# Patient Record
Sex: Female | Born: 1990 | Hispanic: No | Marital: Married | Smoking: Never smoker
Health system: Southern US, Community
[De-identification: ages and names within clinical notes are randomized; demographics above are authoritative.]

## PROBLEM LIST (undated history)

## (undated) DIAGNOSIS — Z87440 Personal history of urinary (tract) infections: Secondary | ICD-10-CM

## (undated) DIAGNOSIS — R079 Chest pain, unspecified: Secondary | ICD-10-CM

## (undated) DIAGNOSIS — O903 Peripartum cardiomyopathy: Secondary | ICD-10-CM

## (undated) DIAGNOSIS — M545 Low back pain, unspecified: Secondary | ICD-10-CM

## (undated) DIAGNOSIS — I509 Heart failure, unspecified: Secondary | ICD-10-CM

## (undated) DIAGNOSIS — E7800 Pure hypercholesterolemia, unspecified: Secondary | ICD-10-CM

## (undated) DIAGNOSIS — R002 Palpitations: Secondary | ICD-10-CM

## (undated) DIAGNOSIS — Z8619 Personal history of other infectious and parasitic diseases: Secondary | ICD-10-CM

## (undated) DIAGNOSIS — E785 Hyperlipidemia, unspecified: Secondary | ICD-10-CM

## (undated) DIAGNOSIS — K219 Gastro-esophageal reflux disease without esophagitis: Secondary | ICD-10-CM

## (undated) DIAGNOSIS — J45909 Unspecified asthma, uncomplicated: Secondary | ICD-10-CM

## (undated) DIAGNOSIS — Z8669 Personal history of other diseases of the nervous system and sense organs: Secondary | ICD-10-CM

## (undated) DIAGNOSIS — M1990 Unspecified osteoarthritis, unspecified site: Secondary | ICD-10-CM

## (undated) DIAGNOSIS — G4730 Sleep apnea, unspecified: Secondary | ICD-10-CM

## (undated) DIAGNOSIS — E041 Nontoxic single thyroid nodule: Secondary | ICD-10-CM

## (undated) DIAGNOSIS — I499 Cardiac arrhythmia, unspecified: Secondary | ICD-10-CM

## (undated) DIAGNOSIS — Z86718 Personal history of other venous thrombosis and embolism: Secondary | ICD-10-CM

## (undated) HISTORY — DX: Unspecified asthma, uncomplicated: J45.909

## (undated) HISTORY — DX: Unspecified osteoarthritis, unspecified site: M19.90

## (undated) HISTORY — DX: Chest pain, unspecified: R07.9

## (undated) HISTORY — PX: ABLATION: SHX5711

## (undated) HISTORY — DX: Heart failure, unspecified: I50.9

## (undated) HISTORY — DX: Personal history of other infectious and parasitic diseases: Z86.19

## (undated) HISTORY — DX: Cardiac arrhythmia, unspecified: I49.9

## (undated) HISTORY — DX: Personal history of urinary (tract) infections: Z87.440

## (undated) HISTORY — DX: Pure hypercholesterolemia, unspecified: E78.00

## (undated) HISTORY — DX: Hyperlipidemia, unspecified: E78.5

## (undated) HISTORY — DX: Peripartum cardiomyopathy: O90.3

## (undated) HISTORY — DX: Nontoxic single thyroid nodule: E04.1

## (undated) HISTORY — DX: Palpitations: R00.2

## (undated) HISTORY — DX: Sleep apnea, unspecified: G47.30

## (undated) HISTORY — DX: Gastro-esophageal reflux disease without esophagitis: K21.9

## (undated) HISTORY — DX: Personal history of other venous thrombosis and embolism: Z86.718

## (undated) HISTORY — DX: Personal history of other diseases of the nervous system and sense organs: Z86.69

## (undated) HISTORY — DX: Low back pain, unspecified: M54.50

---
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
## 2013-05-03 ENCOUNTER — Encounter: Payer: Self-pay | Admitting: Internal Medicine

## 2013-06-07 ENCOUNTER — Ambulatory Visit: Payer: Self-pay | Admitting: Internal Medicine

## 2013-07-13 ENCOUNTER — Encounter: Payer: Self-pay | Admitting: Internal Medicine

## 2013-07-13 ENCOUNTER — Ambulatory Visit (INDEPENDENT_AMBULATORY_CARE_PROVIDER_SITE_OTHER): Payer: BC Managed Care – PPO | Admitting: Internal Medicine

## 2013-07-13 VITALS — BP 120/70 | HR 84 | Temp 98.8°F | Ht 68.25 in | Wt 213.5 lb

## 2013-07-13 DIAGNOSIS — E7800 Pure hypercholesterolemia, unspecified: Secondary | ICD-10-CM

## 2013-07-13 DIAGNOSIS — Z8669 Personal history of other diseases of the nervous system and sense organs: Secondary | ICD-10-CM

## 2013-07-13 DIAGNOSIS — N390 Urinary tract infection, site not specified: Secondary | ICD-10-CM

## 2013-07-13 DIAGNOSIS — R002 Palpitations: Secondary | ICD-10-CM

## 2013-07-15 ENCOUNTER — Encounter: Payer: Self-pay | Admitting: Internal Medicine

## 2013-07-15 DIAGNOSIS — R002 Palpitations: Secondary | ICD-10-CM | POA: Insufficient documentation

## 2013-07-15 DIAGNOSIS — E7800 Pure hypercholesterolemia, unspecified: Secondary | ICD-10-CM | POA: Insufficient documentation

## 2013-07-15 DIAGNOSIS — Z8669 Personal history of other diseases of the nervous system and sense organs: Secondary | ICD-10-CM | POA: Insufficient documentation

## 2013-07-15 DIAGNOSIS — N390 Urinary tract infection, site not specified: Secondary | ICD-10-CM | POA: Insufficient documentation

## 2013-07-15 NOTE — Progress Notes (Signed)
Subjective:    Patient ID: Michele [name], female    DOB: [birth year], 22 y.o.   MRN: [id number]  HPI 22 year old female with past history of hypercholesterolemia and heart palpitations who comes in today to follow up on these issues as well as to establish care.  She graduated in 5/14.  Going to [employer] now for some extra classes.  Has been followed at [facility].  Reports a history of migraines.  Started freshmen year and lasted for a two year period.  Took Maxalt intermittently.  Since this time frame, she has not had any problems with headaches or migraines.  She does report having been told she has a irregular heart rhythm.  First diagnosed in 10th grade.  Saw cardiology.  Had a holter monitor, ECHO and a stress test.  States everything checked out fine.  She may occasionally notice some fluttering, but overall feels good.  Has no cardiac symptoms with increased activity or exertion.  No increased sob.  Overall feels good.  Did take magnesium for a short period and this seemed to help.  Stopped taking because she could not remember to take it.  She does have intermittent uti's.  Is on augmentin now.  Symptoms have cleared.  On birth control pills.  Is sexually active.     Past Medical History  Diagnosis Date  . History of chicken pox   . Hyperlipidemia   . Hx: UTI (urinary tract infection)   . Hx of migraines   . Palpitations     Outpatient Encounter Prescriptions as of 07/13/2013  Medication Sig Dispense Refill  . amoxicillin-clavulanate (AUGMENTIN) 875-125 MG per tablet Take 1 tablet by mouth 2 (two) times daily.      Marland Kitchen MELATONIN PO Take by mouth as needed (for sleep).      . norethindrone (NORA-BE) 0.35 MG tablet Take 1 tablet by mouth daily.       No facility-administered encounter medications on file as of 07/13/2013.    Review of Systems Patient denies any headaches now.  No lightheadedness or dizziness.  No sinus or allergy symptoms.  No chest pain, tightness.  Occasional  fluttering.  May last 5 minutes when it occurs.  Intermittent.  No known triggers.  No associated symptoms.  Does drink an increased amount of caffeine.  No increased shortness of breath, cough or congestion.  No nausea or vomiting.  No abdominal pain or cramping.  No bowel change, such as diarrhea, constipation, BRBPR or melana.  No urine change now.  On Augmentin.  LMP 06/23/13.   Discussed weight loss.      Objective:   Physical Exam Filed Vitals:   07/13/13 1113  BP: 120/70  Pulse: 84  Temp: 98.8 F (5.71 C)   22 year old female in no acute distress.   HEENT:  Nares- clear.  Oropharynx - without lesions. NECK:  Supple.  Nontender.  No audible bruit.  HEART:  Appears to be regular. LUNGS:  No crackles or wheezing audible.  Respirations even and unlabored.  RADIAL PULSE:  Equal bilaterally.    BREASTS:  Performed by gyn.   ABDOMEN:  Soft, nontender.  Bowel sounds present and normal.  No audible abdominal bruit.  GU: performed by gyn.   EXTREMITIES:  No increased edema present.  DP pulses palpable and equal bilaterally.       Assessment & Plan:  DESIRE FOR WEIGHT LOSS.   Discussed weight loss and ways to adjust diet, etc.  Follow.  HEALTH MAINTENANCE.  Last physical - gyn performed.  Will schedule her for a physical here in 1/14.  Obtain records.    I spent 45 minutes with the patient and more than 50% of the time was spent in consultation regarding the above.

## 2013-07-15 NOTE — Assessment & Plan Note (Signed)
Not an issue for her now.  Follow.  

## 2013-07-15 NOTE — Assessment & Plan Note (Signed)
On augmentin now.  Currently asymptomatic.  Follow.

## 2013-07-15 NOTE — Assessment & Plan Note (Signed)
Low cholesterol diet and exercise.  Discussed weight loss.  Follow. Check lipid panel.

## 2013-07-15 NOTE — Assessment & Plan Note (Signed)
Premature beats noted on exam.  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  Has had extensive cardiac work up.  Obtain records.  No symptoms with increased activity or exertion.  Discussed avoiding caffeine and stimulants.  Discussed magoxide.  Will restart magnesium.  Helped previously when she took the magnesium.  Follow.

## 2013-07-19 ENCOUNTER — Other Ambulatory Visit (INDEPENDENT_AMBULATORY_CARE_PROVIDER_SITE_OTHER): Payer: BC Managed Care – PPO

## 2013-07-19 DIAGNOSIS — E7800 Pure hypercholesterolemia, unspecified: Secondary | ICD-10-CM

## 2013-07-19 DIAGNOSIS — R002 Palpitations: Secondary | ICD-10-CM

## 2013-07-19 LAB — CBC WITH DIFFERENTIAL/PLATELET
Basophils Absolute: 0 10*3/uL (ref 0.0–0.1)
Basophils Relative: 0.4 % (ref 0.0–3.0)
Eosinophils Absolute: 0.4 10*3/uL (ref 0.0–0.7)
MCHC: 33.6 g/dL (ref 30.0–36.0)
MCV: 96.3 fl (ref 78.0–100.0)
Monocytes Absolute: 0.6 10*3/uL (ref 0.1–1.0)
Neutrophils Relative %: 50.3 % (ref 43.0–77.0)
Platelets: 257 10*3/uL (ref 150.0–400.0)
RDW: 13.1 % (ref 11.5–14.6)

## 2013-07-19 LAB — COMPREHENSIVE METABOLIC PANEL
AST: 23 U/L (ref 0–37)
Albumin: 4.2 g/dL (ref 3.5–5.2)
Alkaline Phosphatase: 56 U/L (ref 39–117)
BUN: 8 mg/dL (ref 6–23)
Potassium: 3.9 mEq/L (ref 3.5–5.1)

## 2013-07-19 LAB — LIPID PANEL
Cholesterol: 173 mg/dL (ref 0–200)
HDL: 47.9 mg/dL (ref >39.00)
Triglycerides: 92 mg/dL (ref 0.0–149.0)

## 2013-07-20 ENCOUNTER — Encounter: Payer: Self-pay | Admitting: Internal Medicine

## 2013-07-26 ENCOUNTER — Encounter: Payer: Self-pay | Admitting: Internal Medicine

## 2013-09-01 ENCOUNTER — Ambulatory Visit (INDEPENDENT_AMBULATORY_CARE_PROVIDER_SITE_OTHER): Payer: BC Managed Care – PPO | Admitting: Adult Health

## 2013-09-01 ENCOUNTER — Encounter: Payer: Self-pay | Admitting: Adult Health

## 2013-09-01 VITALS — BP 100/70 | HR 58 | Temp 98.2°F | Resp 12 | Ht 68.25 in | Wt 214.5 lb

## 2013-09-01 DIAGNOSIS — J329 Chronic sinusitis, unspecified: Secondary | ICD-10-CM | POA: Insufficient documentation

## 2013-09-01 MED ORDER — AMOXICILLIN-POT CLAVULANATE 875-125 MG PO TABS: 1.0000 | ORAL_TABLET | Freq: Two times a day (BID) | ORAL | Status: DC

## 2013-09-01 MED ORDER — MOMETASONE FUROATE 50 MCG/ACT NA SUSP: 2.0000 | Freq: Every day | NASAL | Status: DC

## 2013-09-01 NOTE — Assessment & Plan Note (Signed)
Start Augmentin. Continue supportive therapy such as OTC cold and sinus medication for symptoms. Robitussin cough suppressant. Saline spray to irrigate sinuses. Provided with sample of Nasonex 2 sprays into each nostril daily. RTC if no improvement within 3-4 days.

## 2013-09-01 NOTE — Patient Instructions (Addendum)
  Start Augmentin twice a day for 10 days.  Continue with supportive therapy such as:   Robitussin cough suppressant    Saline spray to irrigate your sinuses   You can take over the counter cold and sinus medication if this helps your symptoms further   For severe nasal congestion you can try Afrin twice a day for 3 days only.   Call if you are not better within 3-4 days.

## 2013-09-01 NOTE — Progress Notes (Signed)
  Subjective:    Patient ID: Michele [name], female    DOB: 1991-08-05, 22 y.o.   MRN: [id number]  HPI  Patient is a pleasant 22 year old female who presents to clinic with complaints of sinus symptoms that began approximately one week ago. She reports they were your typical cold like symptoms of nasal congestion, sinus drainage, cough. She felt she was improving however symptoms returned and now she is having green drainage from her nose. She denies fever or chills. She has taken over-the-counter medications such as Robitussin for her cough and ibuprofen.  Current Outpatient Prescriptions on File Prior to Visit  Medication Sig Dispense Refill  . MELATONIN PO Take by mouth as needed (for sleep).      . norethindrone (NORA-BE) 0.35 MG tablet Take 1 tablet by mouth daily.       No current facility-administered medications on file prior to visit.    Review of Systems  HENT: Positive for congestion, rhinorrhea, postnasal drip and sinus pressure. Negative for sore throat.   Respiratory: Positive for cough. Negative for shortness of breath and wheezing.        Objective:   Physical Exam  Constitutional: She is oriented to person, place, and time. She appears well-developed and well-nourished. No distress.  HENT:  Head: Normocephalic and atraumatic.  Right Ear: External ear normal.  Left Ear: External ear normal.  Pharyngeal erythema with noted posterior drainage. No exudate.  Cardiovascular: Normal rate, regular rhythm and normal heart sounds.  Exam reveals no gallop and no friction rub.   No murmur heard. Pulmonary/Chest: Effort normal and breath sounds normal. No respiratory distress. She has no wheezes. She has no rales.  Neurological: She is alert and oriented to person, place, and time.  Psychiatric: She has a normal mood and affect. Her behavior is normal. Judgment and thought content normal.    BP 100/70  Pulse 58  Temp(Src) 98.2 F (36.8 C) (Oral)  Resp 12  Ht 5' 8.25"  (1.734 m)  Wt 214 lb 8 oz (97.297 kg)  BMI 32.36 kg/m2  SpO2 99%        Assessment & Plan:

## 2013-09-05 ENCOUNTER — Encounter: Payer: Self-pay | Admitting: Internal Medicine

## 2013-10-27 ENCOUNTER — Other Ambulatory Visit: Payer: Self-pay

## 2014-01-24 ENCOUNTER — Encounter: Payer: Self-pay | Admitting: Internal Medicine

## 2014-01-24 ENCOUNTER — Ambulatory Visit (INDEPENDENT_AMBULATORY_CARE_PROVIDER_SITE_OTHER): Payer: BC Managed Care – PPO | Admitting: Internal Medicine

## 2014-01-24 VITALS — BP 110/80 | HR 83 | Temp 98.2°F | Ht 68.5 in | Wt 226.5 lb

## 2014-01-24 DIAGNOSIS — I499 Cardiac arrhythmia, unspecified: Secondary | ICD-10-CM

## 2014-01-24 DIAGNOSIS — N390 Urinary tract infection, site not specified: Secondary | ICD-10-CM

## 2014-01-24 DIAGNOSIS — Z01419 Encounter for gynecological examination (general) (routine) without abnormal findings: Secondary | ICD-10-CM | POA: Insufficient documentation

## 2014-01-24 DIAGNOSIS — Z0000 Encounter for general adult medical examination without abnormal findings: Secondary | ICD-10-CM

## 2014-01-24 DIAGNOSIS — Z23 Encounter for immunization: Secondary | ICD-10-CM

## 2014-01-24 DIAGNOSIS — E7800 Pure hypercholesterolemia, unspecified: Secondary | ICD-10-CM

## 2014-01-24 DIAGNOSIS — R002 Palpitations: Secondary | ICD-10-CM

## 2014-01-24 DIAGNOSIS — Z8669 Personal history of other diseases of the nervous system and sense organs: Secondary | ICD-10-CM

## 2014-01-24 NOTE — Progress Notes (Signed)
Pre-visit discussion using our clinic review tool. No additional management support is needed unless otherwise documented below in the visit note.  

## 2014-01-24 NOTE — Assessment & Plan Note (Addendum)
Premature beats noted on exam.  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  Has had cardiac work up previously.   Still need records.  No symptoms with increased activity or exertion.  Discussed avoiding caffeine and stimulants.  Discussed magoxide.  Will restart magnesium.  Will have cardiology evaluate to confirm if any further w/up warranted (i.e., f/u ECHO, etc).

## 2014-01-26 ENCOUNTER — Encounter: Payer: Self-pay | Admitting: Internal Medicine

## 2014-01-28 ENCOUNTER — Other Ambulatory Visit (HOSPITAL_COMMUNITY)
Admission: RE | Admit: 2014-01-28 | Discharge: 2014-01-28 | Disposition: A | Discharge status: Home / Self Care | Payer: BC Managed Care – PPO | Source: Ambulatory Visit | Attending: Internal Medicine | Admitting: Internal Medicine

## 2014-01-29 ENCOUNTER — Encounter: Payer: Self-pay | Admitting: Internal Medicine

## 2014-01-29 NOTE — Assessment & Plan Note (Signed)
Not an issue for her now.  Follow.  

## 2014-01-29 NOTE — Assessment & Plan Note (Signed)
Resolved

## 2014-01-29 NOTE — Assessment & Plan Note (Signed)
Low cholesterol diet and exercise.  Discussed weight loss.  Follow lipid panel.

## 2014-01-29 NOTE — Progress Notes (Signed)
Subjective:    Patient ID: Michele [name], female    DOB: 1991-07-26, 23 y.o.   MRN: [id number]  HPI 23 year old female with past history of hypercholesterolemia and heart palpitations who comes in today to follow up on these issues as well as for a complete physical exam.    She graduated in 5/14.  Going to [employer] now for some extra classes.  Has been followed at [facility].  Reports a history of migraines.  Started freshmen year and lasted for a two year period.  Took Maxalt intermittently.  Since then, she has not had any problems with headaches or migraines.  She does report having been told she has a irregular heart rhythm.  First diagnosed in 10th grade.  Saw cardiology.  Had a holter monitor, ECHO and a stress test.  States everything checked out fine.  Has no cardiac symptoms with increased activity or exertion.  No increased sob.  Do not have records to review previous w/up.  On birth control pills.  Is sexually active.     Past Medical History  Diagnosis Date  . History of chicken pox   . Hyperlipidemia   . Hx: UTI (urinary tract infection)   . Hx of migraines   . Palpitations     Outpatient Encounter Prescriptions as of 01/24/2014  Medication Sig  . Magnesium 500 MG TABS Take 1,000 mg by mouth daily.  Marland Kitchen MELATONIN PO Take by mouth as needed (for sleep).  . norethindrone (NORA-BE) 0.35 MG tablet Take 1 tablet by mouth daily.  . [DISCONTINUED] amoxicillin-clavulanate (AUGMENTIN) 875-125 MG per tablet Take 1 tablet by mouth 2 (two) times daily.  . [DISCONTINUED] mometasone (NASONEX) 50 MCG/ACT nasal spray Place 2 sprays into the nose daily.    Review of Systems Patient denies any headaches now.  No lightheadedness or dizziness.  No sinus or allergy symptoms.  No chest pain, tightness.  Previously noticed occasional fluttering.  Reported lasted 5 minutes when occurred.  Intermittent.  No known triggers.  No associated symptoms.  No increased shortness of breath, cough or congestion.   No nausea or vomiting.  No abdominal pain or cramping.  No bowel change, such as diarrhea, constipation, BRBPR or melana.  No urine change now.  LMP 01/10/14.  Regular.  On OCPs.       Objective:   Physical Exam  Filed Vitals:   01/24/14 1041  BP: 110/80  Pulse: 83  Temp: 98.2 F (7.7 C)   23 year old female in no acute distress.   HEENT:  Nares- clear.  Oropharynx - without lesions. NECK:  Supple.  Nontender.  No audible bruit.  HEART:  Appears to be regular with frequent premature beats.   LUNGS:  No crackles or wheezing audible.  Respirations even and unlabored.  RADIAL PULSE:  Equal bilaterally.    BREASTS:  No nipple discharge or nipple retraction present.  Could not appreciate any distinct nodules or axillary adenopathy.  ABDOMEN:  Soft, nontender.  Bowel sounds present and normal.  No audible abdominal bruit.  GU:  Normal external genitalia.  Vaginal vault without lesions.  Cervix identified.  Pap performed. Could not appreciate any adnexal masses or tenderness.  EXTREMITIES:  No increased edema present.  DP pulses palpable and equal bilaterally.          Assessment & Plan:  HEALTH MAINTENANCE.  Physical today.    I spent 25 minutes with the patient and more than 50% of the time was spent  in consultation regarding the above.

## 2014-01-30 ENCOUNTER — Encounter: Payer: Self-pay | Admitting: Internal Medicine

## 2014-02-08 ENCOUNTER — Ambulatory Visit: Payer: BC Managed Care – PPO | Admitting: Cardiovascular Disease

## 2014-02-14 ENCOUNTER — Ambulatory Visit: Payer: BC Managed Care – PPO | Admitting: Cardiovascular Disease

## 2014-02-21 ENCOUNTER — Ambulatory Visit: Payer: BC Managed Care – PPO | Admitting: Cardiovascular Disease

## 2014-03-07 ENCOUNTER — Ambulatory Visit (INDEPENDENT_AMBULATORY_CARE_PROVIDER_SITE_OTHER): Payer: BC Managed Care – PPO | Admitting: Cardiovascular Disease

## 2014-03-07 ENCOUNTER — Encounter: Payer: Self-pay | Admitting: Cardiovascular Disease

## 2014-03-07 VITALS — BP 118/80 | HR 103 | Ht 68.0 in | Wt 226.0 lb

## 2014-03-07 DIAGNOSIS — I493 Ventricular premature depolarization: Secondary | ICD-10-CM

## 2014-03-07 DIAGNOSIS — R002 Palpitations: Secondary | ICD-10-CM

## 2014-03-07 DIAGNOSIS — I4949 Other premature depolarization: Secondary | ICD-10-CM

## 2014-03-07 NOTE — Patient Instructions (Signed)
You are doing well. No medication changes were made.  Go to the app store Do a search for "pulse meter" Download cardiograph, instant heart rate  Please call us if you have new issues that need to be addressed before your next appt.

## 2014-03-07 NOTE — Progress Notes (Signed)
   Patient ID: Michele [name], female    DOB: 11-Oct-1991, 23 y.o.   MRN: [id number]  HPI Comments: Ms. Michele [name] is a very pleasant 23 year old woman with history of PVCs, prior workup by cardiology in 2006 with Holter monitor at that time, echocardiogram and stress test.  On presentation today, she reports that she feels well. She has asymptomatic PVCs. 2 years ago, she did have symptoms possibly associated with stress. No recent symptoms in the past 2 years. He is active, denies any significant shortness of breath or leg edema. No near syncope or syncope. No palpitations or fluttering on a regular basis.  Prior workup includes a Holter monitor 02/03/2005 that showed frequent PVCs, 14% of her total QRS complexes Repeat Holter 09/25/2005 showing frequent PVCs, 60% of her total beats Repeat Holter 06/02/2006 showing PVCs 4% of her total beats, occasional bigeminy  Echocardiogram 03/05/2005 showing essentially normal study Treadmill stress test with VO2 monitoring showing normal exercise capacity, suppression of her PVCs with exercise  EKG shows normal sinus rhythm with rate 103 beats per minute with rare PVC, no significant ST or T wave changes noted Long rhythm strips performed with minimal PVCs   Outpatient Encounter Prescriptions as of 03/07/2014  Medication Sig  . Magnesium 500 MG TABS Take 1,000 mg by mouth daily.  Marland Kitchen MELATONIN PO Take by mouth as needed (for sleep).  . norethindrone (NORA-BE) 0.35 MG tablet Take 1 tablet by mouth daily.    Review of Systems  Constitutional: Negative.   HENT: Negative.   Eyes: Negative.   Respiratory: Negative.   Cardiovascular:       Rare palpitations  Gastrointestinal: Negative.   Endocrine: Negative.   Musculoskeletal: Negative.   Skin: Negative.   Allergic/Immunologic: Negative.   Neurological: Negative.   Hematological: Negative.   Psychiatric/Behavioral: Negative.   All other systems reviewed and are negative.    BP 118/80  Pulse 103   Ht 5\' 8"  (1.727 m)  Wt 226 lb (102.513 kg)  BMI 34.37 kg/m2  Physical Exam  Nursing note and vitals reviewed. Constitutional: She is oriented to person, place, and time. She appears well-developed and well-nourished.  HENT:  Head: Normocephalic.  Nose: Nose normal.  Mouth/Throat: Oropharynx is clear and moist.  Eyes: Conjunctivae are normal. Pupils are equal, round, and reactive to light.  Neck: Normal range of motion. Neck supple. No JVD present.  Cardiovascular: Normal rate, regular rhythm, S1 normal, S2 normal, normal heart sounds and intact distal pulses.  Exam reveals no gallop and no friction rub.   No murmur heard. Pulmonary/Chest: Effort normal and breath sounds normal. No respiratory distress. She has no wheezes. She has no rales. She exhibits no tenderness.  Abdominal: Soft. Bowel sounds are normal. She exhibits no distension. There is no tenderness.  Musculoskeletal: Normal range of motion. She exhibits no edema and no tenderness.  Lymphadenopathy:    She has no cervical adenopathy.  Neurological: She is alert and oriented to person, place, and time. Coordination normal.  Skin: Skin is warm and dry. No rash noted. No erythema.  Psychiatric: She has a normal mood and affect. Her behavior is normal. Judgment and thought content normal.    Assessment and Plan

## 2014-03-07 NOTE — Assessment & Plan Note (Signed)
Sensation of palpitations on a rare basis likely from PVCs. Overall is not symptomatic

## 2014-03-07 NOTE — Assessment & Plan Note (Addendum)
Previous records were reviewed. 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 She has had Holter monitor x3. Normal stress test in the past, normal echocardiogram previously. Currently asymptomatic with no shortness of breath or chest discomfort, no near syncope or syncope. This can be managed medically. We did discuss using propranolol only for symptomatic PVCs. Currently she has no symptoms and would prefer not to take medications at this time. Additional workup could be done if she is symptomatic

## 2014-04-01 ENCOUNTER — Encounter: Payer: Self-pay | Admitting: Internal Medicine

## 2014-04-03 MED ORDER — NORETHINDRONE 0.35 MG PO TABS: 1.0000 | ORAL_TABLET | Freq: Every day | ORAL | Status: DC

## 2014-06-14 ENCOUNTER — Ambulatory Visit: Payer: Self-pay | Admitting: Internal Medicine

## 2014-07-25 ENCOUNTER — Ambulatory Visit: Payer: BC Managed Care – PPO | Admitting: Internal Medicine

## 2014-08-25 ENCOUNTER — Encounter: Payer: Self-pay | Admitting: Internal Medicine

## 2015-01-30 ENCOUNTER — Encounter: Payer: Self-pay | Admitting: Internal Medicine

## 2015-01-30 ENCOUNTER — Other Ambulatory Visit (HOSPITAL_COMMUNITY)
Admission: RE | Admit: 2015-01-30 | Discharge: 2015-01-30 | Disposition: A | Discharge status: Home / Self Care | Payer: BC Managed Care – PPO | Source: Ambulatory Visit | Attending: Internal Medicine | Admitting: Internal Medicine

## 2015-01-30 ENCOUNTER — Ambulatory Visit (INDEPENDENT_AMBULATORY_CARE_PROVIDER_SITE_OTHER): Payer: BC Managed Care – PPO | Admitting: Internal Medicine

## 2015-01-30 VITALS — BP 110/70 | HR 52 | Temp 98.2°F | Ht 67.75 in | Wt 229.2 lb

## 2015-01-30 DIAGNOSIS — Z113 Encounter for screening for infections with a predominantly sexual mode of transmission: Secondary | ICD-10-CM | POA: Diagnosis present

## 2015-01-30 DIAGNOSIS — Z01419 Encounter for gynecological examination (general) (routine) without abnormal findings: Secondary | ICD-10-CM | POA: Diagnosis not present

## 2015-01-30 DIAGNOSIS — N760 Acute vaginitis: Secondary | ICD-10-CM | POA: Insufficient documentation

## 2015-01-30 DIAGNOSIS — I493 Ventricular premature depolarization: Secondary | ICD-10-CM

## 2015-01-30 DIAGNOSIS — E7800 Pure hypercholesterolemia, unspecified: Secondary | ICD-10-CM

## 2015-01-30 DIAGNOSIS — Z139 Encounter for screening, unspecified: Secondary | ICD-10-CM

## 2015-01-30 DIAGNOSIS — Z0000 Encounter for general adult medical examination without abnormal findings: Secondary | ICD-10-CM

## 2015-01-30 DIAGNOSIS — Z13220 Encounter for screening for lipoid disorders: Secondary | ICD-10-CM

## 2015-01-30 LAB — LIPID PANEL
CHOL/HDL RATIO: 4
Cholesterol: 172 mg/dL (ref 0–200)
HDL: 47.5 mg/dL (ref >39.00)
LDL Cholesterol: 103 mg/dL — ABNORMAL HIGH (ref 0–99)
NonHDL: 124.5
TRIGLYCERIDES: 107 mg/dL (ref 0.0–149.0)
VLDL: 21.4 mg/dL (ref 0.0–40.0)

## 2015-01-30 LAB — CBC WITH DIFFERENTIAL/PLATELET
BASOS ABS: 0 10*3/uL (ref 0.0–0.1)
Basophils Relative: 0.2 % (ref 0.0–3.0)
EOS ABS: 0.2 10*3/uL (ref 0.0–0.7)
Eosinophils Relative: 1.7 % (ref 0.0–5.0)
HCT: 44.1 % (ref 36.0–46.0)
Hemoglobin: 15.2 g/dL — ABNORMAL HIGH (ref 12.0–15.0)
LYMPHS ABS: 2.9 10*3/uL (ref 0.7–4.0)
Lymphocytes Relative: 29.4 % (ref 12.0–46.0)
MCHC: 34.4 g/dL (ref 30.0–36.0)
MCV: 92.2 fl (ref 78.0–100.0)
MONO ABS: 0.6 10*3/uL (ref 0.1–1.0)
Monocytes Relative: 5.9 % (ref 3.0–12.0)
Neutro Abs: 6.3 10*3/uL (ref 1.4–7.7)
Neutrophils Relative %: 62.8 % (ref 43.0–77.0)
PLATELETS: 274 10*3/uL (ref 150.0–400.0)
RBC: 4.79 Mil/uL (ref 3.87–5.11)
RDW: 13.5 % (ref 11.5–15.5)
WBC: 10 10*3/uL (ref 4.0–10.5)

## 2015-01-30 LAB — COMPREHENSIVE METABOLIC PANEL
ALBUMIN: 4.4 g/dL (ref 3.5–5.2)
ALK PHOS: 54 U/L (ref 39–117)
ALT: 16 U/L (ref 0–35)
AST: 14 U/L (ref 0–37)
BUN: 9 mg/dL (ref 6–23)
CO2: 25 meq/L (ref 19–32)
CREATININE: 0.84 mg/dL (ref 0.40–1.20)
Calcium: 9.5 mg/dL (ref 8.4–10.5)
Chloride: 105 mEq/L (ref 96–112)
GFR: 87.79 mL/min (ref >60.00)
Glucose, Bld: 99 mg/dL (ref 70–99)
Potassium: 4 mEq/L (ref 3.5–5.1)
Sodium: 139 mEq/L (ref 135–145)
Total Bilirubin: 0.6 mg/dL (ref 0.2–1.2)
Total Protein: 7.3 g/dL (ref 6.0–8.3)

## 2015-01-30 LAB — TSH: TSH: 2.04 u[IU]/mL (ref 0.35–4.50)

## 2015-01-30 MED ORDER — NORETHINDRONE 0.35 MG PO TABS: 1.0000 | ORAL_TABLET | Freq: Every day | ORAL | Status: DC

## 2015-01-30 NOTE — Progress Notes (Signed)
Pre visit review using our clinic review tool, if applicable. No additional management support is needed unless otherwise documented below in the visit note. 

## 2015-01-31 ENCOUNTER — Encounter: Payer: Self-pay | Admitting: Internal Medicine

## 2015-01-31 DIAGNOSIS — Z0000 Encounter for general adult medical examination without abnormal findings: Secondary | ICD-10-CM | POA: Insufficient documentation

## 2015-01-31 LAB — HSV 2 ANTIBODY, IGG: HSV 2 Glycoprotein G Ab, IgG: 0.68 IV

## 2015-01-31 LAB — HIV ANTIBODY (ROUTINE TESTING W REFLEX): HIV 1&2 Ab, 4th Generation: NONREACTIVE

## 2015-01-31 NOTE — Assessment & Plan Note (Signed)
Physical today.  Pap today.   

## 2015-01-31 NOTE — Progress Notes (Signed)
Patient ID: Michele [name], female   DOB: 1991/11/19, 24 y.o.   MRN: [id number]   Subjective:    Patient ID: Michele [name], female    DOB: [birth year], 24 y.o.   MRN: [id number]  HPI  Patient here for her physical exam.  States she is doing well.  Has tried adjusting her diet.  LMP 01/13/15.  States takes her OCPs daily.  Has not skipped doses.  Is sexually active.  Uses protection.  Denies possibility of being pregnant.  No vaginal problems.  Does desire to be checked for STDs.  Overall feels good.  Working.     Past Medical History  Diagnosis Date  . History of chicken pox   . Hyperlipidemia   . Hx: UTI (urinary tract infection)   . Hx of migraines   . Palpitations     Current Outpatient Prescriptions on File Prior to Visit  Medication Sig Dispense Refill  . Magnesium 500 MG TABS Take 1,000 mg by mouth daily.    Marland Kitchen MELATONIN PO Take by mouth as needed (for sleep).     No current facility-administered medications on file prior to visit.    Review of Systems  Constitutional: Negative for appetite change (has adjusted her diet.  ) and unexpected weight change.  HENT: Negative for congestion and sinus pressure.   Respiratory: Negative for cough, chest tightness and shortness of breath.   Cardiovascular: Negative for chest pain, palpitations and leg swelling.  Gastrointestinal: Negative for nausea, vomiting, abdominal pain and diarrhea.  Genitourinary: Negative for dysuria, frequency and vaginal discharge.       Periods regular.    Musculoskeletal: Negative for back pain and joint swelling.  Skin: Negative for color change and rash.  Neurological: Negative for dizziness, light-headedness and headaches.       Objective:    Physical Exam  Constitutional: She is oriented to person, place, and time. She appears well-developed and well-nourished.  HENT:  Nose: Nose normal.  Mouth/Throat: Oropharynx is clear and moist.  Eyes: Right eye exhibits no discharge. Left eye exhibits no  discharge. No scleral icterus.  Neck: Neck supple. No thyromegaly present.  Cardiovascular: Normal rate and regular rhythm.   Pulmonary/Chest: Breath sounds normal. No accessory muscle usage. No tachypnea. No respiratory distress. She has no decreased breath sounds. She has no wheezes. She has no rhonchi. Right breast exhibits no inverted nipple, no mass, no nipple discharge and no tenderness (no axillary adenopathy). Left breast exhibits no inverted nipple, no mass, no nipple discharge and no tenderness (no axilarry adenopathy).  Abdominal: Soft. Bowel sounds are normal. There is no tenderness.  Genitourinary:  Normal external genitalia.  Vaginal vault without lesions.  Cervix identified.  Pap smear performed.  Could not appreciate any adnexal masses or tenderness.    Musculoskeletal: She exhibits no edema or tenderness.  Lymphadenopathy:    She has no cervical adenopathy.  Neurological: She is alert and oriented to person, place, and time.  Skin: Skin is warm. No rash noted.  Psychiatric: She has a normal mood and affect. Her behavior is normal.    BP 110/70 mmHg  Pulse 52  Temp(Src) 98.2 F (36.8 C) (Oral)  Ht 5' 7.75" (1.721 m)  Wt 229 lb 4 oz (103.987 kg)  BMI 35.11 kg/m2  SpO2 99%  LMP 01/13/2015 (Exact Date) Wt Readings from Last 3 Encounters:  01/30/15 229 lb 4 oz (103.987 kg)  03/07/14 226 lb (102.513 kg)  01/24/14 226 lb 8 oz (102.74 kg)  Lab Results  Component Value Date   WBC 10.0 01/30/2015   HGB 15.2* 01/30/2015   HCT 44.1 01/30/2015   PLT 274.0 01/30/2015   GLUCOSE 99 01/30/2015   CHOL 172 01/30/2015   TRIG 107.0 01/30/2015   HDL 47.50 01/30/2015   LDLCALC 103* 01/30/2015   ALT 16 01/30/2015   AST 14 01/30/2015   NA 139 01/30/2015   K 4.0 01/30/2015   CL 105 01/30/2015   CREATININE 0.84 01/30/2015   BUN 9 01/30/2015   CO2 25 01/30/2015   TSH 2.04 01/30/2015       Assessment & Plan:   Problem List Items Addressed This Visit    Frequent PVCs -  Primary    Saw cardiology.  Given previous w/up unrevealing, felt no further w/up warranted.  Currently asymptomatic.  Follow.        Relevant Orders   CBC with Differential/Platelet (Completed)   Comprehensive metabolic panel (Completed)   TSH (Completed)   Cervicovaginal ancillary only   Cytology - PAP   Health care maintenance    Physical today.  Pap today.       Hypercholesterolemia    Low cholesterol diet and exercise.        Relevant Orders   Cervicovaginal ancillary only   Cytology - PAP    Other Visit Diagnoses    Screening cholesterol level        Relevant Orders    Lipid panel (Completed)    Cervicovaginal ancillary only    Cytology - PAP    Screening        Relevant Orders    HIV antibody (with reflex) (Completed)    HSV 2 antibody, IgG (Completed)    Cervicovaginal ancillary only    Cytology - PAP      Desire for STD check.  Pelvic exam as outlined.  Check GC, chlamydia, wet prep, HSV and HIV.      Dale Dassel, MD

## 2015-01-31 NOTE — Assessment & Plan Note (Signed)
Low cholesterol diet and exercise.   

## 2015-01-31 NOTE — Assessment & Plan Note (Signed)
Saw cardiology.  Given previous w/up unrevealing, felt no further w/up warranted.  Currently asymptomatic.  Follow.

## 2015-02-01 ENCOUNTER — Encounter: Payer: Self-pay | Admitting: Internal Medicine

## 2015-02-01 LAB — CERVICOVAGINAL ANCILLARY ONLY
CHLAMYDIA, DNA PROBE: NEGATIVE
Neisseria Gonorrhea: NEGATIVE
Trichomonas: NEGATIVE

## 2015-02-01 LAB — CYTOLOGY - PAP

## 2015-02-02 LAB — CERVICOVAGINAL ANCILLARY ONLY
BACTERIAL VAGINITIS: NEGATIVE
CANDIDA VAGINITIS: NEGATIVE

## 2015-02-04 ENCOUNTER — Encounter: Payer: Self-pay | Admitting: Internal Medicine

## 2015-06-22 ENCOUNTER — Telehealth: Payer: Self-pay

## 2015-06-22 DIAGNOSIS — Z0000 Encounter for general adult medical examination without abnormal findings: Secondary | ICD-10-CM

## 2015-06-22 NOTE — Telephone Encounter (Signed)
Please schedule lab, thanks!

## 2015-06-22 NOTE — Telephone Encounter (Signed)
Order placed for varicella ab.

## 2015-06-22 NOTE — Telephone Encounter (Signed)
The pt called and is hoping to get a lab test ordered to prove she is immune to the chicken pox virus.   Can this be put in?   Thanks!

## 2015-06-22 NOTE — Telephone Encounter (Signed)
lvmom to schedule pt's lab apt

## 2015-07-04 ENCOUNTER — Telehealth: Payer: Self-pay

## 2015-07-04 NOTE — Telephone Encounter (Signed)
The pt's mother called and is hoping to bring her daughter's hand written immunization records in and have them entered into the computer. She states they have to be uploaded for her to start a program in August, but they must be entered into her medical record.  Is this something that is done here?  Mother callback - [phone]

## 2015-07-04 NOTE — Telephone Encounter (Signed)
Spoke with mother & notified her that we should be able to do that for her.

## 2015-07-06 ENCOUNTER — Other Ambulatory Visit (INDEPENDENT_AMBULATORY_CARE_PROVIDER_SITE_OTHER): Payer: BC Managed Care – PPO

## 2015-07-06 ENCOUNTER — Telehealth: Payer: Self-pay | Admitting: Internal Medicine

## 2015-07-06 DIAGNOSIS — Z0000 Encounter for general adult medical examination without abnormal findings: Secondary | ICD-10-CM

## 2015-07-06 NOTE — Telephone Encounter (Signed)
Form placed in red folder & immunization abstracted

## 2015-07-06 NOTE — Telephone Encounter (Signed)
Pt dropped off physical exam form to be filled out. Pt also dropped off a copy of immunization records. Information in Dr. Roby Lofts box/msn

## 2015-07-07 NOTE — Telephone Encounter (Signed)
Will complete when return to work.

## 2015-07-09 LAB — VARICELLA ZOSTER ANTIBODY, IGG: Varicella IgG: >4000 Index — ABNORMAL HIGH (ref <135.00)

## 2015-07-17 ENCOUNTER — Encounter: Payer: Self-pay | Admitting: Internal Medicine

## 2015-07-30 ENCOUNTER — Ambulatory Visit (INDEPENDENT_AMBULATORY_CARE_PROVIDER_SITE_OTHER): Payer: BC Managed Care – PPO | Admitting: Internal Medicine

## 2015-07-30 ENCOUNTER — Encounter: Payer: Self-pay | Admitting: Internal Medicine

## 2015-07-30 VITALS — BP 100/58 | HR 48 | Temp 98.2°F | Wt 227.2 lb

## 2015-07-30 DIAGNOSIS — E7800 Pure hypercholesterolemia, unspecified: Secondary | ICD-10-CM

## 2015-07-30 DIAGNOSIS — Z0289 Encounter for other administrative examinations: Secondary | ICD-10-CM | POA: Diagnosis not present

## 2015-07-30 DIAGNOSIS — Z0000 Encounter for general adult medical examination without abnormal findings: Secondary | ICD-10-CM | POA: Diagnosis not present

## 2015-07-30 DIAGNOSIS — I493 Ventricular premature depolarization: Secondary | ICD-10-CM

## 2015-07-30 NOTE — Progress Notes (Signed)
Patient ID: Michele [name], female   DOB: 21-Nov-1991, 24 y.o.   MRN: [id number]   Subjective:    Patient ID: Michele [name], female    DOB: 1991/05/12, 24 y.o.   MRN: [id number]  HPI  Patient here for a scheduled follow up.  Needs a school form completed.  She is planning to start school next week.  Doing well.  Stays active.  Has been exercising.  Reports no cardiac symptoms with increased activity or exertion.  No sob.  No increased heart rate or palpitations.  Eating and drinking well.  Bowels stable.  Periods regular.  No problems with lifting.     Past Medical History  Diagnosis Date  . History of chicken pox   . Hyperlipidemia   . Hx: UTI (urinary tract infection)   . Hx of migraines   . Palpitations     Outpatient Encounter Prescriptions as of 07/30/2015  Medication Sig  . Magnesium 500 MG TABS Take 1,000 mg by mouth daily.  Marland Kitchen MELATONIN PO Take by mouth as needed (for sleep).  . norethindrone (NORA-BE) 0.35 MG tablet Take 1 tablet (0.35 mg total) by mouth daily.   No facility-administered encounter medications on file as of 07/30/2015.    Review of Systems  Constitutional: Negative for appetite change and unexpected weight change.  HENT: Negative for congestion and sinus pressure.   Respiratory: Negative for cough, chest tightness and shortness of breath.   Cardiovascular: Negative for chest pain, palpitations and leg swelling.  Gastrointestinal: Negative for nausea, vomiting, abdominal pain and diarrhea.  Skin: Negative for color change and rash.  Neurological: Negative for dizziness, light-headedness and headaches.  Psychiatric/Behavioral: Negative for dysphoric mood and agitation.       Objective:     Blood pressure recheck:  118/76, pulse 68  Physical Exam  Constitutional: She appears well-developed and well-nourished. No distress.  HENT:  Nose: Nose normal.  Mouth/Throat: Oropharynx is clear and moist.  Neck: Neck supple. No thyromegaly present.    Cardiovascular: Normal rate and regular rhythm.   Pulmonary/Chest: Breath sounds normal. No respiratory distress. She has no wheezes.  Abdominal: Soft. Bowel sounds are normal. There is no tenderness.  Musculoskeletal: She exhibits no edema or tenderness.  Motor strength appears equal and normal bilateral upper and lower extremities.    Lymphadenopathy:    She has no cervical adenopathy.  Skin: No rash noted. No erythema.  Psychiatric: She has a normal mood and affect.    BP 100/58 mmHg  Pulse 48  Temp(Src) 98.2 F (36.8 C) (Oral)  Wt 227 lb 3.2 oz (103.057 kg)  SpO2 98%  LMP 07/24/2015 Wt Readings from Last 3 Encounters:  07/30/15 227 lb 3.2 oz (103.057 kg)  01/30/15 229 lb 4 oz (103.987 kg)  03/07/14 226 lb (102.513 kg)     Lab Results  Component Value Date   WBC 10.0 01/30/2015   HGB 15.2* 01/30/2015   HCT 44.1 01/30/2015   PLT 274.0 01/30/2015   GLUCOSE 99 01/30/2015   CHOL 172 01/30/2015   TRIG 107.0 01/30/2015   HDL 47.50 01/30/2015   LDLCALC 103* 01/30/2015   ALT 16 01/30/2015   AST 14 01/30/2015   NA 139 01/30/2015   K 4.0 01/30/2015   CL 105 01/30/2015   CREATININE 0.84 01/30/2015   BUN 9 01/30/2015   CO2 25 01/30/2015   TSH 2.04 01/30/2015       Assessment & Plan:   Problem List Items Addressed This Visit  Encounter for completion of form with patient    Form for school completed.        Frequent PVCs - Primary    Has seen cardiology.  No further w/up felt warranted.  Currently asymptomatic.  Is exercising.  Doing well.  Follow.        Health care maintenance    Physical 01/30/15.  PAP ok 01/30/15.        Hypercholesterolemia    Low cholesterol diet and exercise.  Follow cholesterol levels.            Dale [place], MD

## 2015-07-31 ENCOUNTER — Encounter: Payer: Self-pay | Admitting: Internal Medicine

## 2015-07-31 DIAGNOSIS — Z0289 Encounter for other administrative examinations: Secondary | ICD-10-CM | POA: Insufficient documentation

## 2015-07-31 NOTE — Assessment & Plan Note (Signed)
Low cholesterol diet and exercise.  Follow cholesterol levels.

## 2015-07-31 NOTE — Assessment & Plan Note (Signed)
Form for school completed.

## 2015-07-31 NOTE — Assessment & Plan Note (Signed)
Physical 01/30/15.  PAP ok 01/30/15.

## 2015-07-31 NOTE — Assessment & Plan Note (Signed)
Has seen cardiology.  No further w/up felt warranted.  Currently asymptomatic.  Is exercising.  Doing well.  Follow.

## 2015-08-13 ENCOUNTER — Encounter: Payer: Self-pay | Admitting: Internal Medicine

## 2015-08-13 NOTE — Telephone Encounter (Signed)
Please notify pt that I do not mind sending in a prescription (if needed) for drysol.  Which pharmacy does she want [place] to send the prescription?  I know that the [facility] will have this medication.  Please clarify with the patient which pharmacy and go ahead and call in prescription.  I am not sure a prescription is needed.  Let me know if I need to do anything.    Dr Lorin Picket

## 2015-08-14 ENCOUNTER — Telehealth: Payer: Self-pay

## 2015-08-14 MED ORDER — ALUMINUM CHLORIDE 20 % EX SOLN: Freq: Every day | CUTANEOUS | Status: DC

## 2015-08-14 NOTE — Telephone Encounter (Signed)
Noted.  Is there anything else I need to do with this.  If no, then ok.

## 2015-08-14 NOTE — Telephone Encounter (Signed)
Nope it is completed.

## 2015-08-14 NOTE — Telephone Encounter (Deleted)
Sent in per Dr. Lorin Picket, see mychart message for details.

## 2015-10-23 ENCOUNTER — Other Ambulatory Visit: Payer: Self-pay | Admitting: Internal Medicine

## 2015-10-23 NOTE — Telephone Encounter (Signed)
ok'd refill for ocp's one pack with 4 refills.

## 2015-10-23 NOTE — Telephone Encounter (Signed)
Please advise refill? 

## 2015-12-04 ENCOUNTER — Ambulatory Visit: Payer: BC Managed Care – PPO

## 2015-12-11 ENCOUNTER — Ambulatory Visit (INDEPENDENT_AMBULATORY_CARE_PROVIDER_SITE_OTHER): Payer: BC Managed Care – PPO | Admitting: *Deleted

## 2015-12-11 DIAGNOSIS — Z111 Encounter for screening for respiratory tuberculosis: Secondary | ICD-10-CM

## 2015-12-13 ENCOUNTER — Ambulatory Visit: Payer: BC Managed Care – PPO | Admitting: Surgical

## 2015-12-13 DIAGNOSIS — Z111 Encounter for screening for respiratory tuberculosis: Secondary | ICD-10-CM

## 2015-12-13 LAB — TB SKIN TEST
Induration: 0 mm
TB Skin Test: NEGATIVE

## 2015-12-13 NOTE — Progress Notes (Signed)
Patient came in for PPD read that was negative.

## 2016-01-01 ENCOUNTER — Ambulatory Visit: Payer: BC Managed Care – PPO

## 2016-01-03 ENCOUNTER — Ambulatory Visit: Payer: BC Managed Care – PPO

## 2016-01-08 ENCOUNTER — Ambulatory Visit (INDEPENDENT_AMBULATORY_CARE_PROVIDER_SITE_OTHER): Payer: BC Managed Care – PPO

## 2016-01-08 DIAGNOSIS — Z111 Encounter for screening for respiratory tuberculosis: Secondary | ICD-10-CM

## 2016-01-08 MED ORDER — TUBERCULIN PPD 5 UNIT/0.1ML ID SOLN
5.0000 [IU] | Freq: Once | INTRADERMAL | Status: AC
  Administered 2016-01-08: 5 [IU] via INTRADERMAL

## 2016-01-08 NOTE — Progress Notes (Signed)
Patient came for second PPD, placed on Left arm.  Will return on Thursday for reading.

## 2016-01-10 ENCOUNTER — Encounter: Payer: Self-pay | Admitting: *Deleted

## 2016-01-21 ENCOUNTER — Telehealth: Payer: Self-pay | Admitting: Internal Medicine

## 2016-01-21 ENCOUNTER — Other Ambulatory Visit: Payer: Self-pay

## 2016-01-21 MED ORDER — NORETHINDRONE 0.35 MG PO TABS: ORAL_TABLET | ORAL | Status: DC

## 2016-01-21 NOTE — Telephone Encounter (Signed)
filled

## 2016-01-21 NOTE — Telephone Encounter (Signed)
Pt called to reschedule her CPE due to having class. Pt needs a refill for her birth control and she rescheduled her appt for 04/04/2016 that's the next avail appt. ERRIN 0.35 MG tablet Pharmacy is [facility] - [place], [place] - [address] Call pt @ [phone]. Thank You!

## 2016-02-01 ENCOUNTER — Encounter: Payer: BC Managed Care – PPO | Admitting: Internal Medicine

## 2016-04-04 ENCOUNTER — Encounter: Payer: BC Managed Care – PPO | Admitting: Internal Medicine

## 2016-05-01 ENCOUNTER — Encounter: Payer: Self-pay | Admitting: Internal Medicine

## 2016-05-01 ENCOUNTER — Ambulatory Visit
Admission: RE | Admit: 2016-05-01 | Discharge: 2016-05-01 | Disposition: A | Discharge status: Home / Self Care | Payer: BLUE CROSS/BLUE SHIELD | Source: Ambulatory Visit | Attending: Internal Medicine | Admitting: Internal Medicine

## 2016-05-01 ENCOUNTER — Ambulatory Visit (INDEPENDENT_AMBULATORY_CARE_PROVIDER_SITE_OTHER): Payer: BLUE CROSS/BLUE SHIELD | Admitting: Internal Medicine

## 2016-05-01 VITALS — BP 110/80 | HR 71 | Temp 98.2°F | Resp 18 | Ht 67.25 in | Wt 217.4 lb

## 2016-05-01 DIAGNOSIS — E7800 Pure hypercholesterolemia, unspecified: Secondary | ICD-10-CM | POA: Diagnosis not present

## 2016-05-01 DIAGNOSIS — Z0000 Encounter for general adult medical examination without abnormal findings: Secondary | ICD-10-CM

## 2016-05-01 DIAGNOSIS — R300 Dysuria: Secondary | ICD-10-CM

## 2016-05-01 DIAGNOSIS — M545 Low back pain, unspecified: Secondary | ICD-10-CM

## 2016-05-01 DIAGNOSIS — R002 Palpitations: Secondary | ICD-10-CM | POA: Diagnosis not present

## 2016-05-01 DIAGNOSIS — M4806 Spinal stenosis, lumbar region: Secondary | ICD-10-CM | POA: Diagnosis not present

## 2016-05-01 LAB — URINALYSIS, ROUTINE W REFLEX MICROSCOPIC
BILIRUBIN URINE: NEGATIVE
Hgb urine dipstick: NEGATIVE
KETONES UR: NEGATIVE
LEUKOCYTES UA: NEGATIVE
NITRITE: NEGATIVE
PH: 7.5 (ref 5.0–8.0)
RBC / HPF: NONE SEEN (ref 0–?)
SPECIFIC GRAVITY, URINE: 1.01 (ref 1.000–1.030)
TOTAL PROTEIN, URINE-UPE24: NEGATIVE
URINE GLUCOSE: NEGATIVE
UROBILINOGEN UA: 0.2 (ref 0.0–1.0)

## 2016-05-01 LAB — POCT URINE PREGNANCY: Preg Test, Ur: NEGATIVE

## 2016-05-01 NOTE — Progress Notes (Signed)
Patient ID: Michele [name], female   DOB: September 26, 1991, 25 y.o.   MRN: [id number]   Subjective:    Patient ID: Michele [name], female    DOB: 28-Oct-1991, 25 y.o.   MRN: [id number]  HPI  Patient here for her physical exam.  She reports overall doing well.  Does report low back pain.  Years ago, landed hard and has intermittently had low back pain since.  Appears to be more frequent now.  No pain down her leg.  No numbness or tingling.  No urine or bowel change.  In school.  Handling stress well.  Is sexually active.  Uses protection.  Denies possibility of being pregnant.    Past Medical History  Diagnosis Date  . History of chicken pox   . Hyperlipidemia   . Hx: UTI (urinary tract infection)   . Hx of migraines   . Palpitations    Past Surgical History  Procedure Laterality Date  . Tonsillectomy and adenoidectomy  1997   Family History  Problem Relation Age of Onset  . Hyperlipidemia Father   . Hypertension Father   . Breast cancer Maternal Grandmother   . Prostate cancer Maternal Grandfather   . Heart disease Maternal Grandfather   . Hypertension Maternal Grandfather   . Diabetes Maternal Grandfather   . Breast cancer Paternal Grandmother   . Hyperlipidemia Paternal Grandmother   . Hypertension Paternal Grandmother   . Diabetes Paternal Grandmother   . Mental illness Paternal Grandmother   . Hyperlipidemia Paternal Grandfather   . Stroke Paternal Grandfather   . Hypertension Paternal Grandfather   . Hypertension Mother    Social History   Social History  . Marital Status: Single    Spouse Name: N/A  . Number of Children: 0  . Years of Education: N/A   Social History Main Topics  . Smoking status: Never Smoker   . Smokeless tobacco: Never Used  . Alcohol Use: 0.0 oz/week    0 Standard drinks or equivalent per week  . Drug Use: No  . Sexual Activity: Not Asked   Other Topics Concern  . None   Social History Narrative    Outpatient Encounter Prescriptions as  of 05/01/2016  Medication Sig  . aluminum chloride (DRYSOL) 20 % external solution Apply topically at bedtime.  . Magnesium 500 MG TABS Take 1,000 mg by mouth daily.  Marland Kitchen MELATONIN PO Take by mouth as needed (for sleep).  . norethindrone (ERRIN) 0.35 MG tablet TAKE (1) TABLET BY MOUTH EVERY DAY   No facility-administered encounter medications on file as of 05/01/2016.    Review of Systems  Constitutional: Negative for appetite change and unexpected weight change.  HENT: Negative for congestion and sinus pressure.   Eyes: Negative for pain and visual disturbance.  Respiratory: Negative for cough, chest tightness and shortness of breath.   Cardiovascular: Negative for chest pain, palpitations and leg swelling.  Gastrointestinal: Negative for nausea, vomiting, abdominal pain and diarrhea.  Genitourinary: Negative for dysuria and difficulty urinating.  Musculoskeletal: Positive for back pain. Negative for joint swelling.  Skin: Negative for color change and rash.  Neurological: Negative for dizziness, light-headedness and headaches.  Hematological: Negative for adenopathy. Does not bruise/bleed easily.  Psychiatric/Behavioral: Negative for dysphoric mood and agitation.       Objective:    Physical Exam  Constitutional: She is oriented to person, place, and time. She appears well-developed and well-nourished. No distress.  HENT:  Nose: Nose normal.  Mouth/Throat: Oropharynx is clear and  moist.  Eyes: Right eye exhibits no discharge. Left eye exhibits no discharge. No scleral icterus.  Neck: Neck supple. No thyromegaly present.  Cardiovascular: Normal rate and regular rhythm.   Pulmonary/Chest: Breath sounds normal. No accessory muscle usage. No tachypnea. No respiratory distress. She has no decreased breath sounds. She has no wheezes. She has no rhonchi. Right breast exhibits no inverted nipple, no mass, no nipple discharge and no tenderness (no axillary adenopathy). Left breast exhibits  no inverted nipple, no mass, no nipple discharge and no tenderness (no axilarry adenopathy).  Abdominal: Soft. Bowel sounds are normal. There is no tenderness.  Musculoskeletal: She exhibits no edema or tenderness.  Some discomfort with full extension of lower leg.    Lymphadenopathy:    She has no cervical adenopathy.  Neurological: She is alert and oriented to person, place, and time.  Skin: Skin is warm. No rash noted. No erythema.  Psychiatric: She has a normal mood and affect. Her behavior is normal.    BP 110/80 mmHg  Pulse 71  Temp(Src) 98.2 F (36.8 C) (Oral)  Resp 18  Ht 5' 7.25" (1.708 m)  Wt 217 lb 6 oz (98.601 kg)  BMI 33.80 kg/m2  SpO2 97%  LMP 04/10/2016 (Exact Date) Wt Readings from Last 3 Encounters:  05/01/16 217 lb 6 oz (98.601 kg)  07/30/15 227 lb 3.2 oz (103.057 kg)  01/30/15 229 lb 4 oz (103.987 kg)     Lab Results  Component Value Date   WBC 10.0 01/30/2015   HGB 15.2* 01/30/2015   HCT 44.1 01/30/2015   PLT 274.0 01/30/2015   GLUCOSE 99 01/30/2015   CHOL 172 01/30/2015   TRIG 107.0 01/30/2015   HDL 47.50 01/30/2015   LDLCALC 103* 01/30/2015   ALT 16 01/30/2015   AST 14 01/30/2015   NA 139 01/30/2015   K 4.0 01/30/2015   CL 105 01/30/2015   CREATININE 0.84 01/30/2015   BUN 9 01/30/2015   CO2 25 01/30/2015   TSH 2.04 01/30/2015       Assessment & Plan:   Problem List Items Addressed This Visit    Health care maintenance    Physical today 05/01/16.  PAP 01/30/15 - ok.       Hypercholesterolemia    Low cholesterol diet and exercise.  Follow lipid panel.       Low back pain    Low back pain.  No radicular symptoms.  Tylenol as directed.  Check xray.       Relevant Orders   POCT urine pregnancy (Completed)   DG Lumbar Spine 2-3 Views (Completed)   Palpitations    Worked up by cardiology.  See previous notes.  Currently she is doing well.  Follow.         Other Visit Diagnoses    Dysuria    -  Primary    Relevant Orders     Urinalysis, Routine w reflex microscopic (not at [facility]) (Completed)    T4, free        Dale Clawson, MD

## 2016-05-01 NOTE — Progress Notes (Signed)
Pre-visit discussion using our clinic review tool. No additional management support is needed unless otherwise documented below in the visit note.  

## 2016-05-02 ENCOUNTER — Encounter: Payer: Self-pay | Admitting: Internal Medicine

## 2016-05-04 ENCOUNTER — Encounter: Payer: Self-pay | Admitting: Internal Medicine

## 2016-05-04 DIAGNOSIS — M545 Low back pain, unspecified: Secondary | ICD-10-CM | POA: Insufficient documentation

## 2016-05-04 NOTE — Assessment & Plan Note (Signed)
Worked up by cardiology.  See previous notes.  Currently she is doing well.  Follow.

## 2016-05-04 NOTE — Assessment & Plan Note (Signed)
Physical today 05/01/16.  PAP 01/30/15 - ok.

## 2016-05-04 NOTE — Assessment & Plan Note (Signed)
Low cholesterol diet and exercise.  Follow lipid panel.   

## 2016-05-04 NOTE — Assessment & Plan Note (Signed)
Low back pain.  No radicular symptoms.  Tylenol as directed.  Check xray.

## 2016-07-17 ENCOUNTER — Telehealth: Payer: Self-pay | Admitting: Cardiovascular Disease

## 2016-07-17 NOTE — Telephone Encounter (Signed)
pt mother calling stating they say Dr Mariah Milling a few years ago, but was told PVS when she was younger but if anything came up, they would be okay to come back, states she's having some PVS and some SOB as well.   Pt c/o Shortness Of Breath: STAT if SOB developed within the last 24 hours or pt is noticeably SOB on the phone  1. Are you currently SOB (can you hear that pt is SOB on the phone)? Can't hear speaking with Mother but states she had it last yesterday and Tuesday a little.   2. How long have you been experiencing SOB? A few days   3. Are you SOB when sitting or when up moving around? Sitting, last happened when she was driving   4. Are you currently experiencing any other symptoms? Just some dizzy spells.    States right now she has this " light weird feeling' in her chest.

## 2016-07-17 NOTE — Telephone Encounter (Signed)
Spoke w/ pt's mother. She reports that pt was diagnosed w/ PVCs as a preteen and has been primarily asymptomatic.   Pt was driving a few days ago when she felt palpitations and became SOB.  Her HR on her monitor has varied b/t 54-73, has not checked manually. Pt has appt on Mon w/ Dr. Mariah Milling.  Advised her to have pt avoid caffeine and stress. She reports that pt is on break from school, so stress should be less and she should not need caffeine.  She does report that pt had complete workup as a preteen and was told that caffeine had no affect on her PVCs. Advised her to record her HR & BP over the weekend, keep appt on Monday, but to proceed to ED if sx become emergent.

## 2016-07-21 ENCOUNTER — Ambulatory Visit (INDEPENDENT_AMBULATORY_CARE_PROVIDER_SITE_OTHER): Payer: BLUE CROSS/BLUE SHIELD | Admitting: Cardiovascular Disease

## 2016-07-21 ENCOUNTER — Encounter: Payer: Self-pay | Admitting: Cardiovascular Disease

## 2016-07-21 VITALS — BP 120/82 | HR 93 | Ht 67.0 in | Wt 213.5 lb

## 2016-07-21 DIAGNOSIS — I493 Ventricular premature depolarization: Secondary | ICD-10-CM | POA: Diagnosis not present

## 2016-07-21 DIAGNOSIS — R0602 Shortness of breath: Secondary | ICD-10-CM | POA: Diagnosis not present

## 2016-07-21 NOTE — Patient Instructions (Addendum)
Medication Instructions:   Please start bystolic 1/2 up to a full pill as needed for palpitations/tachycardia  Labwork:  No new labs needed  Testing/Procedures:  We will order an echocardiogram for shortness of breath and PVCs Echocardiography is a painless test that uses sound waves to create images of your heart. It provides your doctor with information about the size and shape of your heart and how well your heart's chambers and valves are working. This procedure takes approximately one hour. There are no restrictions for this procedure.   Follow-Up: It was a pleasure seeing you in the office today. Please call us if you have new issues that need to be addressed before your next appt.  [phone]  Your physician wants you to follow-up in: as needed   If you need a refill on your cardiac medications before your next appointment, please call your pharmacy.    Echocardiogram An echocardiogram, or echocardiography, uses sound waves (ultrasound) to produce an image of your heart. The echocardiogram is simple, painless, obtained within a short period of time, and offers valuable information to your health care provider. The images from an echocardiogram can provide information such as:  Evidence of coronary artery disease (CAD).  Heart size.  Heart muscle function.  Heart valve function.  Aneurysm detection.  Evidence of a past heart attack.  Fluid buildup around the heart.  Heart muscle thickening.  Assess heart valve function. LET [facility] CARE PROVIDER KNOW ABOUT:  Any allergies you have.  All medicines you are taking, including vitamins, herbs, eye drops, creams, and over-the-counter medicines.  Previous problems you or members of your family have had with the use of anesthetics.  Any blood disorders you have.  Previous surgeries you have had.  Medical conditions you have.  Possibility of pregnancy, if this applies. BEFORE THE PROCEDURE  No special  preparation is needed. Eat and drink normally.  PROCEDURE   In order to produce an image of your heart, gel will be applied to your chest and a wand-like tool (transducer) will be moved over your chest. The gel will help transmit the sound waves from the transducer. The sound waves will harmlessly bounce off your heart to allow the heart images to be captured in real-time motion. These images will then be recorded.  You may need an IV to receive a medicine that improves the quality of the pictures. AFTER THE PROCEDURE You may return to your normal schedule including diet, activities, and medicines, unless your health care provider tells you otherwise.   This information is not intended to replace advice given to you by your health care provider. Make sure you discuss any questions you have with your health care provider.   Document Released: 12/05/2000 Document Revised: 12/29/2014 Document Reviewed: 08/15/2013 Elsevier Interactive Patient Education [employer]

## 2016-07-21 NOTE — Progress Notes (Signed)
Cardiology Office Note  Date:  07/21/2016   ID:  YASHIRA KUHRT, DOB 03/10/1991, MRN [id number]  PCP:  Dale Simpson, MD   Chief Complaint  Patient presents with  . Other    Patient last seen 2015 for PVC's. Meds reviewed by the patient verbally. Pt. c/o feeling PVC's, lightheadedness and shortness of breath.     HPI:  [name] is a very pleasant 25 year old woman with history of PVCs, prior workup by cardiology in 2006 with Holter monitor at that time, echocardiogram and stress test She presents for routine follow-up of her PVCs  In follow-up today, she has been relatively asymptomatic Rare episodes of chest fluttering, rare shortness of breath Otherwise has been relatively asymptomatic Currently in school, some stress  EKG on today's visit showing normal sinus rhythm with rate 92 bpm, PVCs in a bigeminal pattern, no other significant ST or T-wave changes  Prior workup includes a Holter monitor 02/03/2005 that showed frequent PVCs, 14% of her total QRS complexes Repeat Holter 09/25/2005 showing frequent PVCs, 60% of her total beats Repeat Holter 06/02/2006 showing PVCs 4% of her total beats, occasional bigeminy  Echocardiogram 03/05/2005 showing essentially normal study Treadmill stress test with VO2 monitoring showing normal exercise capacity, suppression of her PVCs with exercise  PMH:   has a past medical history of History of chicken pox; migraines; UTI (urinary tract infection); Hyperlipidemia; and Palpitations.  PSH:    Past Surgical History:  Procedure Laterality Date  . TONSILLECTOMY AND ADENOIDECTOMY  1997    Current Outpatient Prescriptions  Medication Sig Dispense Refill  . aluminum chloride (DRYSOL) 20 % external solution Apply topically at bedtime. 35 mL 0  . Magnesium 500 MG TABS Take 1,000 mg by mouth daily.    Marland Kitchen MELATONIN PO Take by mouth as needed (for sleep).    . norethindrone (ERRIN) 0.35 MG tablet TAKE (1) TABLET BY MOUTH EVERY DAY 1 Package 11    No current facility-administered medications for this visit.      Allergies:   Review of patient's allergies indicates no known allergies.   Social History:  The patient  reports that she has never smoked. She has never used smokeless tobacco. She reports that she drinks alcohol. She reports that she does not use drugs.   Family History:   family history includes Breast cancer in her maternal grandmother and paternal grandmother; Diabetes in her maternal grandfather and paternal grandmother; Heart disease in her maternal grandfather; Hyperlipidemia in her father, paternal grandfather, and paternal grandmother; Hypertension in her father, maternal grandfather, mother, paternal grandfather, and paternal grandmother; Mental illness in her paternal grandmother; Prostate cancer in her maternal grandfather; Stroke in her paternal grandfather.    Review of Systems: Review of Systems  Constitutional: Negative.   Respiratory: Negative.   Cardiovascular: Negative.   Gastrointestinal: Negative.   Musculoskeletal: Negative.   Neurological: Negative.   Psychiatric/Behavioral: Negative.   All other systems reviewed and are negative.    PHYSICAL EXAM: VS:  BP 120/82 (BP Location: Left Arm, Patient Position: Sitting, Cuff Size: Normal)   Pulse 93   Ht 5\' 7"  (1.702 m)   Wt 213 lb 8 oz (96.8 kg)   BMI 33.44 kg/m  , BMI Body mass index is 33.44 kg/m. GEN: Well nourished, well developed, in no acute distress  HEENT: normal  Neck: no JVD, carotid bruits, or masses Cardiac: RRR; ectopy noted, no murmurs, rubs, or gallops,no edema  Respiratory:  clear to auscultation bilaterally, normal work of breathing GI:  soft, nontender, nondistended, + BS MS: no deformity or atrophy  Skin: warm and dry, no rash Neuro:  Strength and sensation are intact Psych: euthymic mood, full affect    Recent Labs: No results found for requested labs within last 8760 hours.    Lipid Panel Lab Results   Component Value Date   CHOL 172 01/30/2015   HDL 47.50 01/30/2015   LDLCALC 103 (H) 01/30/2015   TRIG 107.0 01/30/2015      Wt Readings from Last 3 Encounters:  07/21/16 213 lb 8 oz (96.8 kg)  05/01/16 217 lb 6 oz (98.6 kg)  07/30/15 227 lb 3.2 oz (103.1 kg)       ASSESSMENT AND PLAN:  SOB (shortness of breath) - Plan: ECHOCARDIOGRAM COMPLETE Etiology of her rare episodes of shortness of breath is unclear, likely related to her ectopy Echocardiogram has been ordered to confirm normal cardiac function  PVC (premature ventricular contraction) - Plan: ECHOCARDIOGRAM COMPLETE High PVC burden dating back more than 10 years In general is asymptomatic Stress seems to bring on her symptoms Recommended echocardiogram to evaluate her cardiac function, also recommended she try low-dose beta blocker. She prefers beta blocker as needed rather than every day.  Suggested she try bystolic 5 up to 10 mg as needed   Total encounter time more than 15 minutes  Greater than 50% was spent in counseling and coordination of care with the patient   Disposition:   F/U  6 months   Orders Placed This Encounter  Procedures  . ECHOCARDIOGRAM COMPLETE     Signed, Dossie Arbour, M.D., Ph.D. 07/21/2016  [facility] [place], [place] [phone]

## 2016-07-22 ENCOUNTER — Encounter: Payer: Self-pay | Admitting: Cardiovascular Disease

## 2016-07-28 DIAGNOSIS — L814 Other melanin hyperpigmentation: Secondary | ICD-10-CM | POA: Diagnosis not present

## 2016-07-28 DIAGNOSIS — Z872 Personal history of diseases of the skin and subcutaneous tissue: Secondary | ICD-10-CM | POA: Diagnosis not present

## 2016-07-28 DIAGNOSIS — Z1283 Encounter for screening for malignant neoplasm of skin: Secondary | ICD-10-CM | POA: Diagnosis not present

## 2016-07-28 DIAGNOSIS — L218 Other seborrheic dermatitis: Secondary | ICD-10-CM | POA: Diagnosis not present

## 2016-08-04 ENCOUNTER — Encounter: Payer: Self-pay | Admitting: Family

## 2016-08-04 ENCOUNTER — Ambulatory Visit (INDEPENDENT_AMBULATORY_CARE_PROVIDER_SITE_OTHER): Payer: BLUE CROSS/BLUE SHIELD | Admitting: Family

## 2016-08-04 VITALS — BP 124/80 | HR 50 | Temp 98.2°F | Wt 214.0 lb

## 2016-08-04 DIAGNOSIS — R351 Nocturia: Secondary | ICD-10-CM | POA: Diagnosis not present

## 2016-08-04 DIAGNOSIS — R350 Frequency of micturition: Secondary | ICD-10-CM

## 2016-08-04 LAB — COMPREHENSIVE METABOLIC PANEL
ALBUMIN: 4.5 g/dL (ref 3.5–5.2)
ALK PHOS: 48 U/L (ref 39–117)
ALT: 11 U/L (ref 0–35)
AST: 11 U/L (ref 0–37)
BUN: 7 mg/dL (ref 6–23)
CO2: 26 mEq/L (ref 19–32)
CREATININE: 0.89 mg/dL (ref 0.40–1.20)
Calcium: 9.4 mg/dL (ref 8.4–10.5)
Chloride: 105 mEq/L (ref 96–112)
GFR: 81.16 mL/min (ref >60.00)
Glucose, Bld: 95 mg/dL (ref 70–99)
POTASSIUM: 4 meq/L (ref 3.5–5.1)
SODIUM: 139 meq/L (ref 135–145)
TOTAL PROTEIN: 7.3 g/dL (ref 6.0–8.3)
Total Bilirubin: 0.4 mg/dL (ref 0.2–1.2)

## 2016-08-04 LAB — CBC WITH DIFFERENTIAL/PLATELET
BASOS ABS: 0 10*3/uL (ref 0.0–0.1)
Basophils Relative: 0.4 % (ref 0.0–3.0)
EOS ABS: 0.1 10*3/uL (ref 0.0–0.7)
EOS PCT: 1.4 % (ref 0.0–5.0)
HCT: 43.5 % (ref 36.0–46.0)
HEMOGLOBIN: 14.9 g/dL (ref 12.0–15.0)
Lymphocytes Relative: 32.7 % (ref 12.0–46.0)
Lymphs Abs: 3.5 10*3/uL (ref 0.7–4.0)
MCHC: 34.3 g/dL (ref 30.0–36.0)
MCV: 95 fl (ref 78.0–100.0)
MONO ABS: 0.6 10*3/uL (ref 0.1–1.0)
Monocytes Relative: 6 % (ref 3.0–12.0)
Neutro Abs: 6.4 10*3/uL (ref 1.4–7.7)
Neutrophils Relative %: 59.5 % (ref 43.0–77.0)
Platelets: 292 10*3/uL (ref 150.0–400.0)
RBC: 4.57 Mil/uL (ref 3.87–5.11)
RDW: 13.1 % (ref 11.5–15.5)
WBC: 10.8 10*3/uL — AB (ref 4.0–10.5)

## 2016-08-04 LAB — POCT URINALYSIS DIPSTICK
Bilirubin, UA: NEGATIVE
Blood, UA: NEGATIVE
Glucose, UA: NEGATIVE
Ketones, UA: NEGATIVE
LEUKOCYTES UA: NEGATIVE
NITRITE UA: NEGATIVE
PROTEIN UA: NEGATIVE
SPEC GRAV UA: 1.01
UROBILINOGEN UA: 0.2
pH, UA: 6

## 2016-08-04 LAB — TSH: TSH: 2.19 u[IU]/mL (ref 0.35–4.50)

## 2016-08-04 LAB — POCT URINE PREGNANCY: Preg Test, Ur: NEGATIVE

## 2016-08-04 LAB — HEMOGLOBIN A1C: HEMOGLOBIN A1C: 5 % (ref 4.6–6.5)

## 2016-08-04 NOTE — Progress Notes (Signed)
Subjective:    Patient ID: Michele [name], female    DOB: Dec 07, 1991, 25 y.o.   MRN: [id number]  CC: Michele [name] is a 25 y.o. female who presents today for an acute visit.    HPI: Patient presents for urinary frequency, nausea for past week.  Endorses diarrhea. No blood in stool. Wakes up thirsty. Recent UTI and treated with keflex.  Sexual active. Missed 2 days of birth controls LMP: 06/10/16. No breast tenderness. One day of spotting one month ago. No concern for STDs. No change in vaginal discharge, vaginal pain.    HISTORY:  Past Medical History:  Diagnosis Date  . History of chicken pox   . Hx of migraines   . Hx: UTI (urinary tract infection)   . Hyperlipidemia   . Palpitations    Past Surgical History:  Procedure Laterality Date  . TONSILLECTOMY AND ADENOIDECTOMY  1997   Family History  Problem Relation Age of Onset  . Hyperlipidemia Father   . Hypertension Father   . Hypertension Mother   . Breast cancer Maternal Grandmother   . Prostate cancer Maternal Grandfather   . Heart disease Maternal Grandfather   . Hypertension Maternal Grandfather   . Diabetes Maternal Grandfather   . Breast cancer Paternal Grandmother   . Hyperlipidemia Paternal Grandmother   . Hypertension Paternal Grandmother   . Diabetes Paternal Grandmother   . Mental illness Paternal Grandmother   . Hyperlipidemia Paternal Grandfather   . Stroke Paternal Grandfather   . Hypertension Paternal Grandfather     Allergies: Review of patient's allergies indicates no known allergies. Current Outpatient Prescriptions on File Prior to Visit  Medication Sig Dispense Refill  . aluminum chloride (DRYSOL) 20 % external solution Apply topically at bedtime. 35 mL 0  . Magnesium 500 MG TABS Take 1,000 mg by mouth daily.    Marland Kitchen MELATONIN PO Take by mouth as needed (for sleep).    . norethindrone (ERRIN) 0.35 MG tablet TAKE (1) TABLET BY MOUTH EVERY DAY 1 Package 11   No current facility-administered  medications on file prior to visit.     Social History  Substance Use Topics  . Smoking status: Never Smoker  . Smokeless tobacco: Never Used  . Alcohol use 0.0 oz/week    Review of Systems  Constitutional: Negative for chills and fever.  Respiratory: Negative for cough.   Cardiovascular: Negative for chest pain and palpitations.  Gastrointestinal: Negative for nausea and vomiting.  Endocrine: Positive for polydipsia and polyuria. Negative for polyphagia.  Genitourinary: Positive for frequency. Negative for dysuria, hematuria, vaginal bleeding, vaginal discharge and vaginal pain.      Objective:    BP 124/80   Pulse (!) 50   Temp 98.2 F (36.8 C) (Oral)   Wt 214 lb (97.1 kg)   SpO2 100%   BMI 33.52 kg/m    Physical Exam  Constitutional: She appears well-developed and well-nourished.  Cardiovascular: Normal rate, regular rhythm, normal heart sounds and normal pulses.   Pulmonary/Chest: Effort normal and breath sounds normal. She has no wheezes. She has no rhonchi. She has no rales.  Abdominal: There is no CVA tenderness.  Neurological: She is alert.  Skin: Skin is warm and dry.  Psychiatric: She has a normal mood and affect. Her speech is normal and behavior is normal. Thought content normal.  Vitals reviewed.      Assessment & Plan:   1. Frequent urination  Urinalysis dipstick is negative for blood, glucose, leukocytes. This is  reassuring in the context of working diagnosis of possible diabetes. Urine dipstick is also negative for pregnancy. Pending further workup with labs.   - POCT Urinalysis Dipstick - Comprehensive metabolic panel - CBC with Differential/Platelet - Hemoglobin A1c - TSH - CULTURE, URINE COMPREHENSIVE - POCT urine pregnancy    I am having Ms. Obie DredgeLangley maintain her MELATONIN PO, Magnesium, aluminum chloride, norethindrone, ketoconazole, and nebivolol.   Meds ordered this encounter  Medications  . ketoconazole (NIZORAL) 2 % shampoo     Refill:  3  . nebivolol (BYSTOLIC) 5 MG tablet    Sig: Take 5 mg by mouth as needed.    Return precautions given.   Risks, benefits, and alternatives of the medications and treatment plan prescribed today were discussed, and patient expressed understanding.   Education regarding symptom management and diagnosis given to patient on AVS.  Continue to follow with Dale DurhamSCOTT, CHARLENE, MD for routine health maintenance.   Jannifer HickMegan E Langley and I agreed with plan.   Rennie PlowmanMargaret Arnett, FNP

## 2016-08-04 NOTE — Progress Notes (Signed)
Pre visit review using our clinic review tool, if applicable. No additional management support is needed unless otherwise documented below in the visit note. 

## 2016-08-04 NOTE — Patient Instructions (Signed)
Urine looks fine. Symptom is nonspecific at this time.   Will call with lab results.    If there is no improvement in your symptoms, or if there is any worsening of symptoms, or if you have any additional concerns, please return for re-evaluation; or, if we are closed, consider going to the Emergency Room for evaluation if symptoms urgent.

## 2016-08-06 ENCOUNTER — Ambulatory Visit (INDEPENDENT_AMBULATORY_CARE_PROVIDER_SITE_OTHER): Payer: BLUE CROSS/BLUE SHIELD

## 2016-08-06 ENCOUNTER — Other Ambulatory Visit: Payer: Self-pay

## 2016-08-06 DIAGNOSIS — R0602 Shortness of breath: Secondary | ICD-10-CM

## 2016-08-06 DIAGNOSIS — I493 Ventricular premature depolarization: Secondary | ICD-10-CM

## 2016-08-07 LAB — CULTURE, URINE COMPREHENSIVE

## 2016-08-13 ENCOUNTER — Telehealth: Payer: Self-pay | Admitting: Cardiovascular Disease

## 2016-08-13 NOTE — Telephone Encounter (Signed)
Pt would like echo results. STates she received them through my chart, but would like someone to explain them. Please call.

## 2016-08-13 NOTE — Telephone Encounter (Signed)
Reviewed preliminary report with patient and she wanted to know if there should be any concern regarding the calcifications or the trivial regurgitation that were noted on echo. Let her know that overall her echo showed great pumping action and nothing to worry about at this time. Informed her that once Dr. Mariah Milling reviews report he will send his nurse a report for her to call results to her explaining these things. She verbalized understanding and had no further questions at this time.

## 2016-08-15 NOTE — Telephone Encounter (Signed)
Reviewed results w/ pt.  Advised her of Dr. Windell Hummingbird recommendation.  She currently takes bystolic 5 mg prn for PVCs. She reports that they are only occasional and not bothersome at this point.  Asked her to call back if they increase in frequency. She states that her father is a physician, she had him review results and he reassured her, as well.   She is appreciative and will call back w/ any further questions or concerns.

## 2016-08-15 NOTE — Telephone Encounter (Signed)
Echocardiogram shows normal cardiac function, overall he good study PVC frequency is not hurting her heart function We did see frequent PVCs while she did her echo Would recommend beta blocker as we discussed on previous office visit If she has worsening symptoms from PVCs, antiarrhythmic medications could be used to suppress PVCs

## 2016-08-27 ENCOUNTER — Encounter: Payer: Self-pay | Admitting: Internal Medicine

## 2016-09-24 ENCOUNTER — Telehealth: Payer: BLUE CROSS/BLUE SHIELD | Admitting: Family

## 2016-09-24 ENCOUNTER — Encounter: Payer: Self-pay | Admitting: Internal Medicine

## 2016-09-24 DIAGNOSIS — N390 Urinary tract infection, site not specified: Secondary | ICD-10-CM

## 2016-09-24 MED ORDER — SULFAMETHOXAZOLE-TRIMETHOPRIM 800-160 MG PO TABS: 1.0000 | ORAL_TABLET | Freq: Two times a day (BID) | ORAL | 0 refills | Status: DC

## 2016-09-24 NOTE — Progress Notes (Signed)

## 2016-09-30 DIAGNOSIS — Z23 Encounter for immunization: Secondary | ICD-10-CM | POA: Diagnosis not present

## 2016-10-07 ENCOUNTER — Ambulatory Visit (INDEPENDENT_AMBULATORY_CARE_PROVIDER_SITE_OTHER): Payer: BLUE CROSS/BLUE SHIELD | Admitting: Family

## 2016-10-07 ENCOUNTER — Encounter: Payer: Self-pay | Admitting: Family

## 2016-10-07 VITALS — BP 116/78 | HR 92 | Temp 97.8°F | Wt 214.8 lb

## 2016-10-07 DIAGNOSIS — R300 Dysuria: Secondary | ICD-10-CM | POA: Diagnosis not present

## 2016-10-07 DIAGNOSIS — N3000 Acute cystitis without hematuria: Secondary | ICD-10-CM

## 2016-10-07 LAB — POCT URINALYSIS DIP (MANUAL ENTRY)
BILIRUBIN UA: NEGATIVE
BILIRUBIN UA: NEGATIVE
Glucose, UA: 100 — AB
Leukocytes, UA: NEGATIVE
NITRITE UA: POSITIVE — AB
PH UA: 6.5
RBC UA: NEGATIVE
Spec Grav, UA: 1.015
Urobilinogen, UA: 1

## 2016-10-07 MED ORDER — CIPROFLOXACIN HCL 250 MG PO TABS: 250.0000 mg | ORAL_TABLET | Freq: Two times a day (BID) | ORAL | 0 refills | Status: DC

## 2016-10-07 NOTE — Progress Notes (Signed)
Subjective:    Patient ID: Michele [name], female    DOB: 05/30/1991, 25 y.o.   MRN: [id number]  CC: Michele [name] is a 25 y.o. female who presents today for an acute visit.    HPI: Patient presents for acute visit with chief complaint of dysuria x one day. Notes she had 'noro' for 2 days with nausea, vomiting, and diarrhea since resolved. Took an azo yesterday.  Mild cramp on right side. She has had recurrent UTIs. No concern for STDs, pregnancy. No fever, chills. No changes in vaginal discharge. LMP 09/18/16.  Completed Bactrim from provider on call 10/4.    A1C 5 07/2016.     HISTORY:  Past Medical History:  Diagnosis Date  . History of chicken pox   . Hx of migraines   . Hx: UTI (urinary tract infection)   . Hyperlipidemia   . Palpitations    Past Surgical History:  Procedure Laterality Date  . TONSILLECTOMY AND ADENOIDECTOMY  1997   Family History  Problem Relation Age of Onset  . Hyperlipidemia Father   . Hypertension Father   . Hypertension Mother   . Breast cancer Maternal Grandmother   . Prostate cancer Maternal Grandfather   . Heart disease Maternal Grandfather   . Hypertension Maternal Grandfather   . Diabetes Maternal Grandfather   . Breast cancer Paternal Grandmother   . Hyperlipidemia Paternal Grandmother   . Hypertension Paternal Grandmother   . Diabetes Paternal Grandmother   . Mental illness Paternal Grandmother   . Hyperlipidemia Paternal Grandfather   . Stroke Paternal Grandfather   . Hypertension Paternal Grandfather     Allergies: Review of patient's allergies indicates no known allergies. Current Outpatient Prescriptions on File Prior to Visit  Medication Sig Dispense Refill  . aluminum chloride (DRYSOL) 20 % external solution Apply topically at bedtime. 35 mL 0  . ketoconazole (NIZORAL) 2 % shampoo   3  . Magnesium 500 MG TABS Take 1,000 mg by mouth daily.    Marland Kitchen. MELATONIN PO Take by mouth as needed (for sleep).    . nebivolol (BYSTOLIC) 5  MG tablet Take 5 mg by mouth as needed.    . norethindrone (ERRIN) 0.35 MG tablet TAKE (1) TABLET BY MOUTH EVERY DAY 1 Package 11  . sulfamethoxazole-trimethoprim (BACTRIM DS) 800-160 MG tablet Take 1 tablet by mouth 2 (two) times daily. 14 tablet 0   No current facility-administered medications on file prior to visit.     Social History  Substance Use Topics  . Smoking status: Never Smoker  . Smokeless tobacco: Never Used  . Alcohol use 0.0 oz/week    Review of Systems  Constitutional: Negative for chills and fever.  Respiratory: Negative for cough.   Cardiovascular: Negative for chest pain and palpitations.  Gastrointestinal: Negative for nausea and vomiting.  Genitourinary: Positive for dysuria.      Objective:    BP 116/78   Pulse 92   Temp 97.8 F (36.6 C) (Oral)   Wt 214 lb 12.8 oz (97.4 kg)   LMP 09/15/2016   SpO2 99%   BMI 33.64 kg/m    Physical Exam  Constitutional: She appears well-developed and well-nourished.  Cardiovascular: Normal rate, regular rhythm, normal heart sounds and normal pulses.   Pulmonary/Chest: Effort normal and breath sounds normal. She has no wheezes. She has no rhonchi. She has no rales.  Abdominal: There is no CVA tenderness.  Neurological: She is alert.  Skin: Skin is warm and dry.  Psychiatric:  She has a normal mood and affect. Her speech is normal and behavior is normal. Thought content normal.  Vitals reviewed.      Assessment & Plan:    Acute cystitis without hematuria Afebrile. UA positive for nitrites and protein. Patient I agreed to treat empirically. Pending culture.  - ciprofloxacin (CIPRO) 250 MG tablet; Take 1 tablet (250 mg total) by mouth 2 (two) times daily.  Dispense: 6 tablet; Refill: 0    I am having Ms. [name] start on ciprofloxacin. I am also having her maintain her MELATONIN PO, Magnesium, aluminum chloride, norethindrone, ketoconazole, nebivolol, and sulfamethoxazole-trimethoprim.   Meds ordered this  encounter  Medications  . ciprofloxacin (CIPRO) 250 MG tablet    Sig: Take 1 tablet (250 mg total) by mouth 2 (two) times daily.    Dispense:  6 tablet    Refill:  0    Order Specific Question:   Supervising Provider    Answer:   Sherlene Shams [[id number]]    Return precautions given.   Risks, benefits, and alternatives of the medications and treatment plan prescribed today were discussed, and patient expressed understanding.   Education regarding symptom management and diagnosis given to patient on AVS.  Continue to follow with Dale [place], MD for routine health maintenance.   Michele Hick and I agreed with plan.   Rennie Plowman, FNP

## 2016-10-07 NOTE — Progress Notes (Signed)
Left message for patient to return call back.  

## 2016-10-07 NOTE — Patient Instructions (Signed)
Drink plenty of water and take antibiotic as prescribed.   We are pending the urine culture to know the organism causing the infection and our office will call you with results. If the particular organism requires a different antibiotic than the on prescribed, we will place an order for a new prescription at that time.   If you symptoms worsen or you have new symptoms, please contact our office, or return to clinic for re evaluation.  Urinary Tract Infection Urinary tract infections (UTIs) can develop anywhere along your urinary tract. Your urinary tract is your body's drainage system for removing wastes and extra water. Your urinary tract includes two kidneys, two ureters, a bladder, and a urethra. Your kidneys are a pair of bean-shaped organs. Each kidney is about the size of your fist. They are located below your ribs, one on each side of your spine. CAUSES Infections are caused by microbes, which are microscopic organisms, including fungi, viruses, and bacteria. These organisms are so small that they can only be seen through a microscope. Bacteria are the microbes that most commonly cause UTIs. SYMPTOMS  Symptoms of UTIs may vary by age and gender of the patient and by the location of the infection. Symptoms in young women typically include a frequent and intense urge to urinate and a painful, burning feeling in the bladder or urethra during urination. Older women and men are more likely to be tired, shaky, and weak and have muscle aches and abdominal pain. A fever may mean the infection is in your kidneys. Other symptoms of a kidney infection include pain in your back or sides below the ribs, nausea, and vomiting. DIAGNOSIS To diagnose a UTI, your caregiver will ask you about your symptoms. Your caregiver will also ask you to provide a urine sample. The urine sample will be tested for bacteria and white blood cells. White blood cells are made by your body to help fight infection. TREATMENT    Typically, UTIs can be treated with medication. Because most UTIs are caused by a bacterial infection, they usually can be treated with the use of antibiotics. The choice of antibiotic and length of treatment depend on your symptoms and the type of bacteria causing your infection. HOME CARE INSTRUCTIONS  If you were prescribed antibiotics, take them exactly as your caregiver instructs you. Finish the medication even if you feel better after you have only taken some of the medication.  Drink enough water and fluids to keep your urine clear or pale yellow.  Avoid caffeine, tea, and carbonated beverages. They tend to irritate your bladder.  Empty your bladder often. Avoid holding urine for long periods of time.  Empty your bladder before and after sexual intercourse.  After a bowel movement, women should cleanse from front to back. Use each tissue only once. SEEK MEDICAL CARE IF:   You have back pain.  You develop a fever.  Your symptoms do not begin to resolve within 3 days. SEEK IMMEDIATE MEDICAL CARE IF:   You have severe back pain or lower abdominal pain.  You develop chills.  You have nausea or vomiting.  You have continued burning or discomfort with urination. MAKE SURE YOU:   Understand these instructions.  Will watch your condition.  Will get help right away if you are not doing well or get worse.   This information is not intended to replace advice given to you by your health care provider. Make sure you discuss any questions you have with your health care   provider.   Document Released: 09/17/2005 Document Revised: 08/29/2015 Document Reviewed: 01/16/2012 Elsevier Interactive Patient Education 2016 [employer] Inc[employer]    

## 2016-10-08 NOTE — Progress Notes (Signed)
Patient has been informed.

## 2016-10-09 LAB — URINE CULTURE: ORGANISM ID, BACTERIA: NO GROWTH

## 2016-10-19 DIAGNOSIS — J029 Acute pharyngitis, unspecified: Secondary | ICD-10-CM | POA: Diagnosis not present

## 2016-10-19 DIAGNOSIS — B084 Enteroviral vesicular stomatitis with exanthem: Secondary | ICD-10-CM | POA: Diagnosis not present

## 2016-12-07 ENCOUNTER — Encounter: Payer: Self-pay | Admitting: Internal Medicine

## 2016-12-08 ENCOUNTER — Other Ambulatory Visit: Payer: Self-pay | Admitting: Surgical

## 2016-12-08 MED ORDER — NORETHINDRONE 0.35 MG PO TABS: ORAL_TABLET | ORAL | 11 refills | Status: DC

## 2017-02-10 ENCOUNTER — Encounter: Payer: Self-pay | Admitting: Internal Medicine

## 2017-02-10 DIAGNOSIS — N926 Irregular menstruation, unspecified: Secondary | ICD-10-CM

## 2017-02-11 ENCOUNTER — Telehealth: Payer: Self-pay | Admitting: *Deleted

## 2017-02-11 NOTE — Telephone Encounter (Signed)
Pt called this morning to check the status of the referral order for GYN.

## 2017-02-11 NOTE — Telephone Encounter (Signed)
Pt requested to know, where will she be referred to for her OBGYN  Pt contact [phone]

## 2017-02-12 NOTE — Telephone Encounter (Signed)
Checked chart referral has been entered called pt and let her know someone should be calling in the next few days to make app.

## 2017-02-12 NOTE — Telephone Encounter (Signed)
Order placed for gyn referral.  

## 2017-03-11 DIAGNOSIS — N921 Excessive and frequent menstruation with irregular cycle: Secondary | ICD-10-CM | POA: Diagnosis not present

## 2017-04-14 DIAGNOSIS — N921 Excessive and frequent menstruation with irregular cycle: Secondary | ICD-10-CM | POA: Diagnosis not present

## 2017-05-04 ENCOUNTER — Encounter: Payer: BLUE CROSS/BLUE SHIELD | Admitting: Internal Medicine

## 2017-05-25 ENCOUNTER — Encounter: Payer: Self-pay | Admitting: Internal Medicine

## 2017-05-25 ENCOUNTER — Ambulatory Visit (INDEPENDENT_AMBULATORY_CARE_PROVIDER_SITE_OTHER): Payer: BLUE CROSS/BLUE SHIELD | Admitting: Internal Medicine

## 2017-05-25 ENCOUNTER — Other Ambulatory Visit (HOSPITAL_COMMUNITY)
Admission: RE | Admit: 2017-05-25 | Discharge: 2017-05-25 | Disposition: A | Discharge status: Home / Self Care | Payer: BLUE CROSS/BLUE SHIELD | Source: Ambulatory Visit | Attending: Internal Medicine | Admitting: Internal Medicine

## 2017-05-25 VITALS — BP 112/74 | HR 40 | Temp 98.6°F | Resp 12 | Ht 67.0 in | Wt 210.6 lb

## 2017-05-25 DIAGNOSIS — I493 Ventricular premature depolarization: Secondary | ICD-10-CM | POA: Diagnosis not present

## 2017-05-25 DIAGNOSIS — E7800 Pure hypercholesterolemia, unspecified: Secondary | ICD-10-CM | POA: Diagnosis not present

## 2017-05-25 DIAGNOSIS — Z124 Encounter for screening for malignant neoplasm of cervix: Secondary | ICD-10-CM

## 2017-05-25 DIAGNOSIS — Z0000 Encounter for general adult medical examination without abnormal findings: Secondary | ICD-10-CM | POA: Diagnosis not present

## 2017-05-25 NOTE — Progress Notes (Signed)
Patient ID: CAROLENE GITTO, female   DOB: 1991/05/18, 26 y.o.   MRN: [id number]   Subjective:    Patient ID: ANALESE SOVINE, female    DOB: 20-May-1991, 26 y.o.   MRN: [id number]  HPI  Patient here for her physical exam.  She saw gyn for abnormal uterine bleeding.  Had ultrasound.  Desires no further evaluation.  LMP 05/07/17.  She also has seen cardiology for frequent PVCs.  Last evaluated 07/21/16.  See cardiology note.  Overall stable.  No chest pain.  Breathing stable.  No acid reflux.  No abdominal pain.  Bowels moving.  Overall she feels she is doing well.    Past Medical History:  Diagnosis Date  . History of chicken pox   . Hx of migraines   . Hx: UTI (urinary tract infection)   . Hyperlipidemia   . Palpitations    Past Surgical History:  Procedure Laterality Date  . TONSILLECTOMY AND ADENOIDECTOMY  1997   Family History  Problem Relation Age of Onset  . Hyperlipidemia Father   . Hypertension Father   . Hypertension Mother   . Breast cancer Maternal Grandmother   . Prostate cancer Maternal Grandfather   . Heart disease Maternal Grandfather   . Hypertension Maternal Grandfather   . Diabetes Maternal Grandfather   . Breast cancer Paternal Grandmother   . Hyperlipidemia Paternal Grandmother   . Hypertension Paternal Grandmother   . Diabetes Paternal Grandmother   . Mental illness Paternal Grandmother   . Hyperlipidemia Paternal Grandfather   . Stroke Paternal Grandfather   . Hypertension Paternal Grandfather    Social History   Social History  . Marital status: Single    Spouse name: N/A  . Number of children: 0  . Years of education: N/A   Social History Main Topics  . Smoking status: Never Smoker  . Smokeless tobacco: Never Used  . Alcohol use 0.0 oz/week  . Drug use: No  . Sexual activity: Not Asked   Other Topics Concern  . None   Social History Narrative  . None    Outpatient Encounter Prescriptions as of 05/25/2017  Medication Sig  . aluminum  chloride (DRYSOL) 20 % external solution Apply topically at bedtime.  Marland Kitchen ketoconazole (NIZORAL) 2 % shampoo   . Magnesium 500 MG TABS Take 1,000 mg by mouth daily.  Marland Kitchen MELATONIN PO Take by mouth as needed (for sleep).  . nebivolol (BYSTOLIC) 5 MG tablet Take 5 mg by mouth as needed.  . norethindrone (ERRIN) 0.35 MG tablet TAKE (1) TABLET BY MOUTH EVERY DAY  . sulfamethoxazole-trimethoprim (BACTRIM DS) 800-160 MG tablet Take 1 tablet by mouth 2 (two) times daily.  . [DISCONTINUED] ciprofloxacin (CIPRO) 250 MG tablet Take 1 tablet (250 mg total) by mouth 2 (two) times daily.   No facility-administered encounter medications on file as of 05/25/2017.     Review of Systems  Constitutional: Negative for appetite change and unexpected weight change.  HENT: Negative for congestion and sinus pressure.   Eyes: Negative for pain and visual disturbance.  Respiratory: Negative for cough, chest tightness and shortness of breath.   Cardiovascular: Negative for chest pain, palpitations and leg swelling.  Gastrointestinal: Negative for abdominal pain, diarrhea, nausea and vomiting.  Genitourinary: Negative for difficulty urinating and dysuria.  Musculoskeletal: Negative for back pain and joint swelling.  Skin: Negative for color change and rash.  Neurological: Negative for dizziness, light-headedness and headaches.  Hematological: Negative for adenopathy. Does not bruise/bleed easily.  Psychiatric/Behavioral:  Negative for agitation and dysphoric mood.       Objective:    Physical Exam  Constitutional: She is oriented to person, place, and time. She appears well-developed and well-nourished. No distress.  HENT:  Nose: Nose normal.  Mouth/Throat: Oropharynx is clear and moist.  Eyes: Right eye exhibits no discharge. Left eye exhibits no discharge. No scleral icterus.  Neck: Neck supple. No thyromegaly present.  Cardiovascular: Normal rate and regular rhythm.   Pulmonary/Chest: Breath sounds normal. No  accessory muscle usage. No tachypnea. No respiratory distress. She has no decreased breath sounds. She has no wheezes. She has no rhonchi. Right breast exhibits no inverted nipple, no mass, no nipple discharge and no tenderness (no axillary adenopathy). Left breast exhibits no inverted nipple, no mass, no nipple discharge and no tenderness (no axilarry adenopathy).  Abdominal: Soft. Bowel sounds are normal. There is no tenderness.  Genitourinary:  Genitourinary Comments: Normal external genitalia.  Vaginal vault without lesions.  Cervix identified.  Pap smear performed.  Could not appreciate any adnexal masses or tenderness.    Musculoskeletal: She exhibits no edema or tenderness.  Lymphadenopathy:    She has no cervical adenopathy.  Neurological: She is alert and oriented to person, place, and time.  Skin: Skin is warm. No rash noted. No erythema.  Psychiatric: She has a normal mood and affect. Her behavior is normal.    BP 112/74 (BP Location: Left Arm, Patient Position: Sitting, Cuff Size: Normal)   Pulse (!) 40   Temp 98.6 F (37 C) (Oral)   Resp 12   Ht 5\' 7"  (1.702 m)   Wt 210 lb 9.6 oz (95.5 kg)   LMP 05/07/2017   SpO2 99%   BMI 32.98 kg/m  Wt Readings from Last 3 Encounters:  05/25/17 210 lb 9.6 oz (95.5 kg)  10/07/16 214 lb 12.8 oz (97.4 kg)  08/04/16 214 lb (97.1 kg)     Lab Results  Component Value Date   WBC 10.8 (H) 08/04/2016   HGB 14.9 08/04/2016   HCT 43.5 08/04/2016   PLT 292.0 08/04/2016   GLUCOSE 95 08/04/2016   CHOL 172 01/30/2015   TRIG 107.0 01/30/2015   HDL 47.50 01/30/2015   LDLCALC 103 (H) 01/30/2015   ALT 11 08/04/2016   AST 11 08/04/2016   NA 139 08/04/2016   K 4.0 08/04/2016   CL 105 08/04/2016   CREATININE 0.89 08/04/2016   BUN 7 08/04/2016   CO2 26 08/04/2016   TSH 2.19 08/04/2016   HGBA1C 5.0 08/04/2016       Assessment & Plan:   Problem List Items Addressed This Visit    Frequent PVCs    Has seen cardiology.  No further w/up  felt warranted.  Currently asymptomatic.  Doing well.  Follow.       Health care maintenance    Physical today 05/25/17.  PAP 05/25/17.        Hypercholesterolemia    Low cholesterol diet and exercise.  Follow lipid panel.         Other Visit Diagnoses    Screening for cervical cancer    -  Primary   Relevant Orders   Cytology - PAP       Dale Waynoka, MD

## 2017-05-27 ENCOUNTER — Encounter: Payer: Self-pay | Admitting: Internal Medicine

## 2017-05-27 NOTE — Assessment & Plan Note (Signed)
Physical today 05/25/17.  PAP 05/25/17.

## 2017-05-27 NOTE — Assessment & Plan Note (Signed)
Has seen cardiology.  No further w/up felt warranted.  Currently asymptomatic.  Doing well.  Follow.

## 2017-05-27 NOTE — Assessment & Plan Note (Signed)
Low cholesterol diet and exercise.  Follow lipid panel.   

## 2017-05-29 LAB — CYTOLOGY - PAP
DIAGNOSIS: NEGATIVE
HPV: NOT DETECTED

## 2017-05-31 ENCOUNTER — Encounter: Payer: Self-pay | Admitting: Internal Medicine

## 2017-06-28 ENCOUNTER — Telehealth: Payer: BLUE CROSS/BLUE SHIELD | Admitting: Family

## 2017-06-28 DIAGNOSIS — R399 Unspecified symptoms and signs involving the genitourinary system: Secondary | ICD-10-CM

## 2017-06-28 MED ORDER — NITROFURANTOIN MONOHYD MACRO 100 MG PO CAPS: 100.0000 mg | ORAL_CAPSULE | Freq: Two times a day (BID) | ORAL | 0 refills | Status: DC

## 2017-06-28 NOTE — Progress Notes (Signed)

## 2017-07-01 DIAGNOSIS — H1045 Other chronic allergic conjunctivitis: Secondary | ICD-10-CM | POA: Diagnosis not present

## 2017-08-27 DIAGNOSIS — D229 Melanocytic nevi, unspecified: Secondary | ICD-10-CM | POA: Diagnosis not present

## 2017-08-27 DIAGNOSIS — L578 Other skin changes due to chronic exposure to nonionizing radiation: Secondary | ICD-10-CM | POA: Diagnosis not present

## 2017-08-27 DIAGNOSIS — L218 Other seborrheic dermatitis: Secondary | ICD-10-CM | POA: Diagnosis not present

## 2017-08-27 DIAGNOSIS — Z859 Personal history of malignant neoplasm, unspecified: Secondary | ICD-10-CM | POA: Diagnosis not present

## 2017-09-19 DIAGNOSIS — Z23 Encounter for immunization: Secondary | ICD-10-CM | POA: Diagnosis not present

## 2017-11-09 ENCOUNTER — Encounter: Payer: Self-pay | Admitting: Internal Medicine

## 2017-11-10 NOTE — Telephone Encounter (Signed)
Please call and let her know that we are closed on Friday.  Can schedule a lab appt for another day.  Let me know if she wants cholesterol checked and will need fasting labs.  I can order.

## 2017-11-18 ENCOUNTER — Telehealth: Payer: Self-pay | Admitting: Radiology

## 2017-11-18 DIAGNOSIS — L659 Nonscarring hair loss, unspecified: Secondary | ICD-10-CM

## 2017-11-18 DIAGNOSIS — E7800 Pure hypercholesterolemia, unspecified: Secondary | ICD-10-CM

## 2017-11-18 NOTE — Telephone Encounter (Signed)
Pt coming in for labs tomorrow, please place future orders. Thank you.  

## 2017-11-18 NOTE — Telephone Encounter (Signed)
Order placed for labs.

## 2017-11-19 ENCOUNTER — Other Ambulatory Visit (INDEPENDENT_AMBULATORY_CARE_PROVIDER_SITE_OTHER): Payer: BLUE CROSS/BLUE SHIELD

## 2017-11-19 DIAGNOSIS — E7800 Pure hypercholesterolemia, unspecified: Secondary | ICD-10-CM

## 2017-11-19 DIAGNOSIS — L659 Nonscarring hair loss, unspecified: Secondary | ICD-10-CM

## 2017-11-20 LAB — CBC WITH DIFFERENTIAL/PLATELET
BASOS PCT: 0.9 % (ref 0.0–3.0)
Basophils Absolute: 0.1 10*3/uL (ref 0.0–0.1)
EOS ABS: 0.2 10*3/uL (ref 0.0–0.7)
Eosinophils Relative: 1.5 % (ref 0.0–5.0)
HEMATOCRIT: 44.5 % (ref 36.0–46.0)
HEMOGLOBIN: 14.9 g/dL (ref 12.0–15.0)
LYMPHS PCT: 37.6 % (ref 12.0–46.0)
Lymphs Abs: 3.9 10*3/uL (ref 0.7–4.0)
MCHC: 33.4 g/dL (ref 30.0–36.0)
MCV: 99 fl (ref 78.0–100.0)
MONOS PCT: 6.3 % (ref 3.0–12.0)
Monocytes Absolute: 0.7 10*3/uL (ref 0.1–1.0)
NEUTROS ABS: 5.6 10*3/uL (ref 1.4–7.7)
Neutrophils Relative %: 53.7 % (ref 43.0–77.0)
PLATELETS: 273 10*3/uL (ref 150.0–400.0)
RBC: 4.49 Mil/uL (ref 3.87–5.11)
RDW: 12.7 % (ref 11.5–15.5)
WBC: 10.4 10*3/uL (ref 4.0–10.5)

## 2017-11-20 LAB — COMPREHENSIVE METABOLIC PANEL
ALBUMIN: 4.5 g/dL (ref 3.5–5.2)
ALT: 13 U/L (ref 0–35)
AST: 15 U/L (ref 0–37)
Alkaline Phosphatase: 44 U/L (ref 39–117)
BUN: 10 mg/dL (ref 6–23)
CALCIUM: 9.7 mg/dL (ref 8.4–10.5)
CHLORIDE: 104 meq/L (ref 96–112)
CO2: 25 meq/L (ref 19–32)
CREATININE: 0.88 mg/dL (ref 0.40–1.20)
GFR: 81.43 mL/min (ref >60.00)
Glucose, Bld: 77 mg/dL (ref 70–99)
POTASSIUM: 4.8 meq/L (ref 3.5–5.1)
Sodium: 139 mEq/L (ref 135–145)
Total Bilirubin: 0.6 mg/dL (ref 0.2–1.2)
Total Protein: 7.6 g/dL (ref 6.0–8.3)

## 2017-11-20 LAB — LIPID PANEL
CHOL/HDL RATIO: 4
Cholesterol: 184 mg/dL (ref 0–200)
HDL: 50.9 mg/dL (ref >39.00)
LDL CALC: 114 mg/dL — AB (ref 0–99)
NONHDL: 132.76
TRIGLYCERIDES: 93 mg/dL (ref 0.0–149.0)
VLDL: 18.6 mg/dL (ref 0.0–40.0)

## 2017-11-20 LAB — TSH: TSH: 1.49 u[IU]/mL (ref 0.35–4.50)

## 2017-11-21 ENCOUNTER — Other Ambulatory Visit: Payer: Self-pay | Admitting: Internal Medicine

## 2017-11-21 ENCOUNTER — Encounter: Payer: Self-pay | Admitting: Internal Medicine

## 2017-11-22 ENCOUNTER — Encounter: Payer: Self-pay | Admitting: Internal Medicine

## 2017-11-23 ENCOUNTER — Other Ambulatory Visit: Payer: Self-pay | Admitting: *Deleted

## 2017-11-23 MED ORDER — NORETHINDRONE 0.35 MG PO TABS: ORAL_TABLET | ORAL | 6 refills | Status: DC

## 2018-01-24 ENCOUNTER — Encounter: Payer: Self-pay | Admitting: Internal Medicine

## 2018-01-25 ENCOUNTER — Telehealth: Payer: BLUE CROSS/BLUE SHIELD | Admitting: Family

## 2018-01-25 DIAGNOSIS — R6889 Other general symptoms and signs: Secondary | ICD-10-CM

## 2018-01-25 MED ORDER — OSELTAMIVIR PHOSPHATE 75 MG PO CAPS: 75.0000 mg | ORAL_CAPSULE | Freq: Every day | ORAL | 0 refills | Status: DC

## 2018-01-25 NOTE — Telephone Encounter (Signed)
Spoke to pt.  She has been in close contact with friend now with flu.  Has been hugging, etc,  Staying in same house.  Will go ahead and call in tamiflu - prophylactic dose.  She was informed if developed symptoms c/w flu to take treatment dose.

## 2018-01-25 NOTE — Progress Notes (Signed)
Thank you for the details you included in the comment boxes. Those details are very helpful in determining the best course of treatment for you and help us to provide the best care.  E visit for Flu like symptoms   We are sorry that you are not feeling well.  Here is how we plan to help! Based on what you have shared with me it looks like you may have flu-like symptoms that should be watched but do not seem to indicate anti-viral treatment.  Influenza or "the flu" is   an infection caused by a respiratory virus. The flu virus is highly contagious and persons who did not receive their yearly flu vaccination may "catch" the flu from close contact.  We have anti-viral medications to treat the viruses that cause this infection. They are not a "cure" and only shorten the course of the infection. These prescriptions are most effective when they are given within the first 2 days of "flu" symptoms. Antiviral medication are indicated if you have a high risk of complications from the flu. You should  also consider an antiviral medication if you are in close contact with someone who is at risk. These medications can help patients avoid complications from the flu  but have side effects that you should know. Possible side effects from Tamiflu or oseltamivir include nausea, vomiting, diarrhea, dizziness, headaches, eye redness, sleep problems or other respiratory symptoms. You should not take Tamiflu if you have an allergy to oseltamivir or any to the ingredients in Tamiflu.  Based upon your symptoms and potential risk factors I recommend that you follow the flu symptoms recommendation that I have listed below.  ANYONE WHO HAS FLU SYMPTOMS SHOULD: . Stay home. The flu is highly contagious and going out or to work exposes others! . Be sure to drink plenty of fluids. Water is fine as well as fruit juices, sodas and electrolyte beverages. You may want to stay away from caffeine or alcohol. If you are nauseated, try  taking small sips of liquids. How do you know if you are getting enough fluid? Your urine should be a pale yellow or almost colorless. . Get rest. . Taking a steamy shower or using a humidifier may help nasal congestion and ease sore throat pain. Using a saline nasal spray works much the same way. . Cough drops, hard candies and sore throat lozenges may ease your cough. . Line up a caregiver. Have someone check on you regularly.   GET HELP RIGHT AWAY IF: . You cannot keep down liquids or your medications. . You become short of breath . Your fell like you are going to pass out or loose consciousness. . Your symptoms persist after you have completed your treatment plan MAKE SURE YOU   Understand these instructions.  Will watch your condition.  Will get help right away if you are not doing well or get worse.  Your e-visit answers were reviewed by a board certified advanced clinical practitioner to complete your personal care plan.  Depending on the condition, your plan could have included both over the counter or prescription medications.  If there is a problem please reply  once you have received a response from your provider.  Your safety is important to us.  If you have drug allergies check your prescription carefully.    You can use MyChart to ask questions about today's visit, request a non-urgent call back, or ask for a work or school excuse for 24 hours related to   this e-Visit. If it has been greater than 24 hours you will need to follow up with your provider, or enter a new e-Visit to address those concerns.  You will get an e-mail in the next two days asking about your experience.  I hope that your e-visit has been valuable and will speed your recovery. Thank you for using e-visits.   

## 2018-02-20 ENCOUNTER — Telehealth: Payer: BLUE CROSS/BLUE SHIELD | Admitting: Nurse Practitioner

## 2018-02-20 DIAGNOSIS — N3000 Acute cystitis without hematuria: Secondary | ICD-10-CM

## 2018-02-20 MED ORDER — NITROFURANTOIN MONOHYD MACRO 100 MG PO CAPS: 100.0000 mg | ORAL_CAPSULE | Freq: Two times a day (BID) | ORAL | 0 refills | Status: DC

## 2018-02-20 NOTE — Progress Notes (Signed)

## 2018-03-03 DIAGNOSIS — L570 Actinic keratosis: Secondary | ICD-10-CM | POA: Diagnosis not present

## 2018-03-07 ENCOUNTER — Ambulatory Visit
Admission: RE | Admit: 2018-03-07 | Discharge: 2018-03-07 | Disposition: A | Discharge status: Home / Self Care | Payer: BLUE CROSS/BLUE SHIELD | Source: Ambulatory Visit | Attending: Orthopedic Surgery | Admitting: Orthopedic Surgery

## 2018-03-07 ENCOUNTER — Encounter: Payer: Self-pay | Admitting: Emergency Medicine

## 2018-03-07 ENCOUNTER — Other Ambulatory Visit: Payer: Self-pay

## 2018-03-07 ENCOUNTER — Ambulatory Visit
Admission: EM | Admit: 2018-03-07 | Discharge: 2018-03-07 | Disposition: A | Discharge status: Home / Self Care | Payer: BLUE CROSS/BLUE SHIELD | Attending: Family Medicine | Admitting: Family Medicine

## 2018-03-07 DIAGNOSIS — M79605 Pain in left leg: Secondary | ICD-10-CM

## 2018-03-07 DIAGNOSIS — M79662 Pain in left lower leg: Secondary | ICD-10-CM | POA: Insufficient documentation

## 2018-03-07 MED ORDER — MELOXICAM 15 MG PO TABS: 15.0000 mg | ORAL_TABLET | Freq: Every day | ORAL | 0 refills | Status: DC

## 2018-03-07 NOTE — ED Provider Notes (Addendum)
[facility]    CSN: [id number] Arrival date & time: 03/07/18  0804     History   Chief Complaint Chief Complaint  Patient presents with  . Leg Pain    HPI Michele [name] is a 27 y.o. female presents to the urgent care facility for evaluation of left leg pain.  Pain is been present for 2 days.  She denies any trauma, injury or increase in activity prior to having the left calf pain.  She describes the pain is mild to moderate, pain will fluctuate.  She denies any warmth or redness.  Pain is been waking her at night.  She denies any numbness or tingling or burning.  She describes the pain as a deep ache.  HPI  Past Medical History:  Diagnosis Date  . History of chicken pox   . Hx of migraines   . Hx: UTI (urinary tract infection)   . Hyperlipidemia   . Palpitations     Patient Active Problem List   Diagnosis Date Noted  . Low back pain 05/04/2016  . Encounter for completion of form with patient 07/31/2015  . Health care maintenance 01/31/2015  . 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 Sinusitis 09/01/2013  . Palpitations 07/15/2013  . Hx of migraines 07/15/2013  . Hypercholesterolemia 07/15/2013  . UTI (urinary tract infection) 07/15/2013    Past Surgical History:  Procedure Laterality Date  . TONSILLECTOMY AND ADENOIDECTOMY  1997    OB History    No data available       Home Medications    Prior to Admission medications   Medication Sig Start Date End Date Taking? Authorizing Provider  Magnesium 500 MG TABS Take 1,000 mg by mouth daily.   Yes [provider]  MELATONIN PO Take by mouth as needed (for sleep).   Yes [provider]  nebivolol (BYSTOLIC) 5 MG tablet Take 5 mg by mouth as needed.   Yes [provider]  norethindrone (ERRIN) 0.35 MG tablet TAKE (1) TABLET BY MOUTH EVERY DAY 11/23/17  Yes Dale Barnstable, MD  aluminum chloride (DRYSOL) 20 % external solution Apply topically at bedtime. 08/14/15   Dale Darbydale,  MD  ketoconazole (NIZORAL) 2 % shampoo  07/28/16   [provider]  nitrofurantoin, macrocrystal-monohydrate, (MACROBID) 100 MG capsule Take 1 capsule (100 mg total) by mouth 2 (two) times daily. 1 po BId 02/20/18   Daphine Deutscher, Mary-Margaret, FNP  oseltamivir (TAMIFLU) 75 MG capsule Take 1 capsule (75 mg total) by mouth daily. 01/25/18   Dale Ceylon, MD  sulfamethoxazole-trimethoprim (BACTRIM DS) 800-160 MG tablet Take 1 tablet by mouth 2 (two) times daily. 09/24/16   Junie Spencer, FNP    Family History Family History  Problem Relation Age of Onset  . Hyperlipidemia Father   . Hypertension Father   . Hypertension Mother   . Breast cancer Maternal Grandmother   . Prostate cancer Maternal Grandfather   . Heart disease Maternal Grandfather   . Hypertension Maternal Grandfather   . Diabetes Maternal Grandfather   . Breast cancer Paternal Grandmother   . Hyperlipidemia Paternal Grandmother   . Hypertension Paternal Grandmother   . Diabetes Paternal Grandmother   . Mental illness Paternal Grandmother   . Hyperlipidemia Paternal Grandfather   . Stroke Paternal Grandfather   . Hypertension Paternal Grandfather     Social History Social History   Tobacco Use  . Smoking status: Never Smoker  . Smokeless tobacco: Never Used  Substance Use Topics  . Alcohol  use: Yes    Alcohol/week: 0.0 oz  . Drug use: No     Allergies   Patient has no known allergies.   Review of Systems Review of Systems  Constitutional: Negative for fever.  Respiratory: Negative for shortness of breath.   Cardiovascular: Negative for chest pain.  Gastrointestinal: Negative for abdominal pain.  Genitourinary: Negative for difficulty urinating, dysuria and urgency.  Musculoskeletal: Positive for myalgias. Negative for back pain and gait problem.  Skin: Negative for rash.  Neurological: Negative for dizziness and headaches.     Physical Exam Triage Vital Signs ED Triage Vitals  Enc Vitals Group       BP 03/07/18 0818 124/73     Pulse Rate 03/07/18 0818 90     Resp 03/07/18 0818 14     Temp 03/07/18 0818 98.3 F (36.8 C)     Temp Source 03/07/18 0818 Oral     SpO2 03/07/18 0818 100 %     Weight 03/07/18 0814 215 lb (97.5 kg)     Height 03/07/18 0814 5\' 7"  (1.702 m)     Head Circumference --      Peak Flow --      Pain Score 03/07/18 0814 4     Pain Loc --      Pain Edu? --      Excl. in GC? --    No data found.  Updated Vital Signs BP 124/73 (BP Location: Left Arm)   Pulse 90   Temp 98.3 F (36.8 C) (Oral)   Resp 14   Ht 5\' 7"  (1.702 m)   Wt 215 lb (97.5 kg)   LMP 03/01/2018 (Exact Date)   SpO2 100%   BMI 33.67 kg/m   Visual Acuity Right Eye Distance:   Left Eye Distance:   Bilateral Distance:    Right Eye Near:   Left Eye Near:    Bilateral Near:     Physical Exam  Constitutional: She is oriented to person, place, and time. She appears well-developed and well-nourished.  HENT:  Head: Normocephalic and atraumatic.  Eyes: Conjunctivae are normal.  Neck: Normal range of motion.  Cardiovascular: Normal rate.  Pulmonary/Chest: Effort normal. No respiratory distress.  Musculoskeletal: Normal range of motion.  Examination of the left lower extremity shows mild swelling throughout the left calf with local deep pain tenderness with no increase in pain with plantar flexion dorsiflexion.  No warmth erythema or edema..  2+ dorsalis pedis pulse.  There are superficial veins present on the left calf.  Neurological: She is alert and oriented to person, place, and time.  Skin: Skin is warm. No rash noted.  Psychiatric: She has a normal mood and affect. Her behavior is normal. Thought content normal.  Nursing note and vitals reviewed.    UC Treatments / Results  Labs (all labs ordered are listed, but only abnormal results are displayed) Labs Reviewed - No data to display  EKG  EKG Interpretation None       Radiology No results  found.  Procedures Procedures (including critical care time)  Medications Ordered in UC Medications - No data to display   Initial Impression / Assessment and Plan / UC Course  I have reviewed the triage vital signs and the nursing notes.  Pertinent labs & imaging results that were available during my care of the patient were reviewed by me and considered in my medical decision making (see chart for details).     27 year old female with left calf pain, no  explanation for having pain in the left calf.  Mild swelling is noted.  Superficial veins present and tender to palpation.  Ultrasound lower extremity negative for DVT.  Patient will start with meloxicam 15 mg daily x 10 days  Final Clinical Impressions(s) / UC Diagnoses   Final diagnoses:  Left leg pain  Pain of left calf    ED Discharge Orders        Ordered    US Venous Img Lower Unilateral Left     03/07/18 0830        Evon Slack, PA-C 03/07/18 0948    Evon Slack, PA-C 03/07/18 [phone]

## 2018-03-07 NOTE — ED Triage Notes (Signed)
Patient c/o leg cramps in her left lower leg for the past 2 days.  Patient denies injury or fall.

## 2018-03-07 NOTE — Discharge Instructions (Signed)
We are ordering an ultrasound of the left lower extremity.  Please go to [facility] for ultrasound.  We will notify you of the results.  If your ultrasound is positive for DVT, you will need to be placed on anti-coagulation medication.  If your ultrasound is negative, we will treat as musculoskeletal pain and start oral anti-inflammatory medications.

## 2018-03-11 ENCOUNTER — Encounter: Payer: Self-pay | Admitting: Internal Medicine

## 2018-03-11 NOTE — Telephone Encounter (Signed)
I can see her tomorrow at 12:15 - work in appt.

## 2018-03-11 NOTE — Telephone Encounter (Signed)
Patient would like to wait to see if symptoms worsen. She said that symptoms were better throughout the day. No new symptoms.

## 2018-04-22 ENCOUNTER — Encounter: Payer: Self-pay | Admitting: Internal Medicine

## 2018-04-23 ENCOUNTER — Encounter: Payer: Self-pay | Admitting: Internal Medicine

## 2018-04-23 NOTE — Telephone Encounter (Signed)
If earlier appt available, I am ok with scheduling.

## 2018-04-28 ENCOUNTER — Encounter: Payer: Self-pay | Admitting: Internal Medicine

## 2018-05-11 ENCOUNTER — Telehealth: Payer: BLUE CROSS/BLUE SHIELD | Admitting: Family

## 2018-05-11 DIAGNOSIS — A499 Bacterial infection, unspecified: Secondary | ICD-10-CM

## 2018-05-11 DIAGNOSIS — N390 Urinary tract infection, site not specified: Secondary | ICD-10-CM

## 2018-05-11 MED ORDER — NITROFURANTOIN MONOHYD MACRO 100 MG PO CAPS: 100.0000 mg | ORAL_CAPSULE | Freq: Two times a day (BID) | ORAL | 0 refills | Status: DC

## 2018-05-11 NOTE — Progress Notes (Signed)

## 2018-05-19 ENCOUNTER — Encounter: Payer: Self-pay | Admitting: Internal Medicine

## 2018-05-29 DIAGNOSIS — H109 Unspecified conjunctivitis: Secondary | ICD-10-CM | POA: Diagnosis not present

## 2018-05-31 ENCOUNTER — Encounter: Payer: BLUE CROSS/BLUE SHIELD | Admitting: Internal Medicine

## 2018-07-01 ENCOUNTER — Encounter: Payer: Self-pay | Admitting: Internal Medicine

## 2018-07-03 ENCOUNTER — Other Ambulatory Visit: Payer: Self-pay

## 2018-07-03 ENCOUNTER — Ambulatory Visit
Admission: EM | Admit: 2018-07-03 | Discharge: 2018-07-03 | Disposition: A | Discharge status: Home / Self Care | Payer: BLUE CROSS/BLUE SHIELD | Attending: Emergency Medicine | Admitting: Emergency Medicine

## 2018-07-03 ENCOUNTER — Encounter: Payer: Self-pay | Admitting: Gynecology

## 2018-07-03 DIAGNOSIS — R221 Localized swelling, mass and lump, neck: Secondary | ICD-10-CM | POA: Diagnosis not present

## 2018-07-03 LAB — RAPID STREP SCREEN (MED CTR MEBANE ONLY): STREPTOCOCCUS, GROUP A SCREEN (DIRECT): NEGATIVE

## 2018-07-03 MED ORDER — FAMOTIDINE 20 MG PO TABS: 20.0000 mg | ORAL_TABLET | Freq: Two times a day (BID) | ORAL | 0 refills | Status: DC

## 2018-07-03 NOTE — ED Triage Notes (Signed)
Patient c/o sore throat. 

## 2018-07-03 NOTE — ED Provider Notes (Signed)
HPI  SUBJECTIVE:  Michele [name] is a 27 y.o. female who presents with 2 and half weeks of a sensation of throat swelling along the left side and along her uvula.  She states her symptoms started after having URI symptoms which have since resolved.  She denies pain with swallowing foods or liquids, sore throat, fevers, drooling, trismus.  No voice changes, neck stiffness, sensation of her throat swelling shut, difficulty breathing, stridor, nasal congestion, postnasal drip.  She reports a nonproductive cough.  She tried coughing with temporary improvement in her symptoms.  No aggravating factors.  No antibiotics in the past month.  No antipyretic in the past 6 to 8 hours.  She denies GERD symptoms.  She drinks a lot of coffee but states that she has not burned her throat.  Past medical history of tonsillectomy and occasional GERD.  No history diabetes, hypertension.  LMP: Now.  Denies possibility being pregnant.  IHW:TUUEK, Westley Hummer, MD    Past Medical History:  Diagnosis Date  . History of chicken pox   . Hx of migraines   . Hx: UTI (urinary tract infection)   . Hyperlipidemia   . Palpitations     Past Surgical History:  Procedure Laterality Date  . TONSILLECTOMY AND ADENOIDECTOMY  1997    Family History  Problem Relation Age of Onset  . Hyperlipidemia Father   . Hypertension Father   . Hypertension Mother   . Breast cancer Maternal Grandmother   . Prostate cancer Maternal Grandfather   . Heart disease Maternal Grandfather   . Hypertension Maternal Grandfather   . Diabetes Maternal Grandfather   . Breast cancer Paternal Grandmother   . Hyperlipidemia Paternal Grandmother   . Hypertension Paternal Grandmother   . Diabetes Paternal Grandmother   . Mental illness Paternal Grandmother   . Hyperlipidemia Paternal Grandfather   . Stroke Paternal Grandfather   . Hypertension Paternal Grandfather     Social History   Tobacco Use  . Smoking status: Never Smoker  .  Smokeless tobacco: Never Used  Substance Use Topics  . Alcohol use: Yes    Alcohol/week: 0.0 oz  . Drug use: No    No current facility-administered medications for this encounter.   Current Outpatient Medications:  .  aluminum chloride (DRYSOL) 20 % external solution, Apply topically at bedtime., Disp: 35 mL, Rfl: 0 .  ketoconazole (NIZORAL) 2 % shampoo, , Disp: , Rfl: 3 .  Magnesium 500 MG TABS, Take 1,000 mg by mouth daily., Disp: , Rfl:  .  MELATONIN PO, Take by mouth as needed (for sleep)., Disp: , Rfl:  .  norethindrone (ERRIN) 0.35 MG tablet, TAKE (1) TABLET BY MOUTH EVERY DAY, Disp: 1 Package, Rfl: 6 .  famotidine (PEPCID) 20 MG tablet, Take 1 tablet (20 mg total) by mouth 2 (two) times daily., Disp: 60 tablet, Rfl: 0 .  nebivolol (BYSTOLIC) 5 MG tablet, Take 5 mg by mouth as needed., Disp: , Rfl:   No Known Allergies   ROS  As noted in HPI.   Physical Exam  BP (!) 141/84 (BP Location: Left Arm)   Pulse 64   Temp 98.2 F (36.8 C) (Oral)   Ht 5\' 8"  (1.727 m)   Wt 215 lb (97.5 kg)   LMP 07/01/2018   SpO2 100%   BMI 32.69 kg/m   Constitutional: Well developed, well nourished, no acute distress Eyes:  EOMI, conjunctiva normal bilaterally HENT: Normocephalic, atraumatic,mucus membranes moist.   positive cobblestoning.  No postnasal drip.  Normal oropharynx.  Uvula midline.  Uvula normal size.  Tonsils surgically absent.  Airway widely patent.  No erythema, swelling along the soft palate.  No stridor.  Normal voice.  No swelling of the jaw. Neck: No cervical lymphadenopathy Respiratory: Normal inspiratory effort Cardiovascular: Normal rate GI: nondistended skin: No rash, skin intact Musculoskeletal: no deformities Neurologic: Alert & oriented x 3, no focal neuro deficits Psychiatric: Speech and behavior appropriate   ED Course   Medications - No data to display  Orders Placed This Encounter  Procedures  . Rapid Strep Screen ([facility] ONLY)    Standing  Status:   Standing    Number of Occurrences:   1  . Culture, group A strep    Standing Status:   Standing    Number of Occurrences:   1    Results for orders placed or performed during the hospital encounter of 07/03/18 (from the past 24 hour(s))  Rapid Strep Screen ([facility] ONLY)     Status: None   Collection Time: 07/03/18  2:43 PM  Result Value Ref Range   Streptococcus, Group A Screen (Direct) NEGATIVE NEGATIVE   No results found.  ED Clinical Impression  Throat swelling   ED Assessment/Plan  Unsure as to the etiology of the patient's symptoms however it does not appear to be a retropharyngeal abscess, epiglottitis, peritonsillar abscess, uvulitis, chemical burn.  Airway is widely patent.  Will send home some Pepcid in case this could be reflux and have her follow-up with Dr. Elenore Rota, ENT on call if not better in 2 weeks.  Discussed labs, MDM, treatment plan, and plan for follow-up with patient. Discussed sn/sx that should prompt return to the ED. patient agrees with plan.   Meds ordered this encounter  Medications  . famotidine (PEPCID) 20 MG tablet    Sig: Take 1 tablet (20 mg total) by mouth 2 (two) times daily.    Dispense:  60 tablet    Refill:  0    *This clinic note was created using Scientist, clinical (histocompatibility and immunogenetics). Therefore, there may be occasional mistakes despite careful proofreading.   ?   Domenick Gong, MD 07/03/18 1659

## 2018-07-03 NOTE — Discharge Instructions (Signed)
Try the Pepcid and cut back on the coffee.  Follow-up with Dr. Elenore Rota, ear nose throat specialist if not better in 2 weeks.  Go immediately to the ER for the signs and symptoms we discussed

## 2018-07-04 NOTE — Telephone Encounter (Signed)
Can you schedule her for 08/03/18 at 4:00.  There is an open slot.  Can you schedule and then contact her with an appointment date and time.  Thanks.

## 2018-07-05 NOTE — Telephone Encounter (Signed)
Pt scheduled  

## 2018-07-06 LAB — CULTURE, GROUP A STREP (THRC)

## 2018-07-26 ENCOUNTER — Ambulatory Visit: Payer: BLUE CROSS/BLUE SHIELD | Admitting: Internal Medicine

## 2018-08-02 ENCOUNTER — Encounter: Payer: Self-pay | Admitting: Internal Medicine

## 2018-08-03 ENCOUNTER — Other Ambulatory Visit: Payer: Self-pay

## 2018-08-03 ENCOUNTER — Ambulatory Visit: Payer: BLUE CROSS/BLUE SHIELD | Admitting: Internal Medicine

## 2018-08-03 MED ORDER — NORETHINDRONE 0.35 MG PO TABS: ORAL_TABLET | ORAL | 0 refills | Status: DC

## 2018-08-25 ENCOUNTER — Other Ambulatory Visit: Payer: Self-pay | Admitting: Internal Medicine

## 2018-08-26 DIAGNOSIS — F458 Other somatoform disorders: Secondary | ICD-10-CM | POA: Diagnosis not present

## 2018-08-26 DIAGNOSIS — K219 Gastro-esophageal reflux disease without esophagitis: Secondary | ICD-10-CM | POA: Diagnosis not present

## 2018-08-27 MED ORDER — NORETHINDRONE 0.35 MG PO TABS: ORAL_TABLET | ORAL | 1 refills | Status: DC

## 2018-09-07 DIAGNOSIS — Z23 Encounter for immunization: Secondary | ICD-10-CM | POA: Diagnosis not present

## 2018-09-22 DIAGNOSIS — M25512 Pain in left shoulder: Secondary | ICD-10-CM | POA: Diagnosis not present

## 2018-09-27 ENCOUNTER — Encounter: Payer: BLUE CROSS/BLUE SHIELD | Admitting: Internal Medicine

## 2018-09-27 DIAGNOSIS — K219 Gastro-esophageal reflux disease without esophagitis: Secondary | ICD-10-CM | POA: Diagnosis not present

## 2018-09-27 DIAGNOSIS — F458 Other somatoform disorders: Secondary | ICD-10-CM | POA: Diagnosis not present

## 2018-10-12 ENCOUNTER — Ambulatory Visit (INDEPENDENT_AMBULATORY_CARE_PROVIDER_SITE_OTHER): Payer: BLUE CROSS/BLUE SHIELD | Admitting: Internal Medicine

## 2018-10-12 VITALS — BP 108/70 | HR 50 | Temp 98.2°F | Resp 18 | Ht 67.0 in | Wt 210.4 lb

## 2018-10-12 DIAGNOSIS — N926 Irregular menstruation, unspecified: Secondary | ICD-10-CM

## 2018-10-12 DIAGNOSIS — Z0000 Encounter for general adult medical examination without abnormal findings: Secondary | ICD-10-CM

## 2018-10-12 DIAGNOSIS — Z872 Personal history of diseases of the skin and subcutaneous tissue: Secondary | ICD-10-CM | POA: Diagnosis not present

## 2018-10-12 DIAGNOSIS — Z859 Personal history of malignant neoplasm, unspecified: Secondary | ICD-10-CM | POA: Diagnosis not present

## 2018-10-12 DIAGNOSIS — I493 Ventricular premature depolarization: Secondary | ICD-10-CM

## 2018-10-12 DIAGNOSIS — L659 Nonscarring hair loss, unspecified: Secondary | ICD-10-CM | POA: Insufficient documentation

## 2018-10-12 DIAGNOSIS — E7800 Pure hypercholesterolemia, unspecified: Secondary | ICD-10-CM | POA: Diagnosis not present

## 2018-10-12 DIAGNOSIS — L578 Other skin changes due to chronic exposure to nonionizing radiation: Secondary | ICD-10-CM | POA: Diagnosis not present

## 2018-10-12 DIAGNOSIS — Z1283 Encounter for screening for malignant neoplasm of skin: Secondary | ICD-10-CM | POA: Diagnosis not present

## 2018-10-12 LAB — CBC WITH DIFFERENTIAL/PLATELET
BASOS PCT: 0.6 % (ref 0.0–3.0)
Basophils Absolute: 0 10*3/uL (ref 0.0–0.1)
EOS PCT: 1.3 % (ref 0.0–5.0)
Eosinophils Absolute: 0.1 10*3/uL (ref 0.0–0.7)
HCT: 44.4 % (ref 36.0–46.0)
HEMOGLOBIN: 15.3 g/dL — AB (ref 12.0–15.0)
LYMPHS PCT: 37.1 % (ref 12.0–46.0)
Lymphs Abs: 3 10*3/uL (ref 0.7–4.0)
MCHC: 34.5 g/dL (ref 30.0–36.0)
MCV: 96.6 fl (ref 78.0–100.0)
MONOS PCT: 6.3 % (ref 3.0–12.0)
Monocytes Absolute: 0.5 10*3/uL (ref 0.1–1.0)
Neutro Abs: 4.5 10*3/uL (ref 1.4–7.7)
Neutrophils Relative %: 54.7 % (ref 43.0–77.0)
Platelets: 275 10*3/uL (ref 150.0–400.0)
RBC: 4.6 Mil/uL (ref 3.87–5.11)
RDW: 12.6 % (ref 11.5–15.5)
WBC: 8.2 10*3/uL (ref 4.0–10.5)

## 2018-10-12 LAB — COMPREHENSIVE METABOLIC PANEL
ALBUMIN: 4.6 g/dL (ref 3.5–5.2)
ALK PHOS: 52 U/L (ref 39–117)
ALT: 14 U/L (ref 0–35)
AST: 12 U/L (ref 0–37)
BUN: 10 mg/dL (ref 6–23)
CO2: 26 mEq/L (ref 19–32)
Calcium: 9.5 mg/dL (ref 8.4–10.5)
Chloride: 104 mEq/L (ref 96–112)
Creatinine, Ser: 0.78 mg/dL (ref 0.40–1.20)
GFR: 92.99 mL/min (ref >60.00)
Glucose, Bld: 76 mg/dL (ref 70–99)
POTASSIUM: 4.5 meq/L (ref 3.5–5.1)
Sodium: 139 mEq/L (ref 135–145)
TOTAL PROTEIN: 7.6 g/dL (ref 6.0–8.3)
Total Bilirubin: 0.9 mg/dL (ref 0.2–1.2)

## 2018-10-12 LAB — LIPID PANEL
Cholesterol: 176 mg/dL (ref 0–200)
HDL: 50 mg/dL (ref >39.00)
LDL Cholesterol: 108 mg/dL — ABNORMAL HIGH (ref 0–99)
NONHDL: 125.9
TRIGLYCERIDES: 88 mg/dL (ref 0.0–149.0)
Total CHOL/HDL Ratio: 4
VLDL: 17.6 mg/dL (ref 0.0–40.0)

## 2018-10-12 LAB — TSH: TSH: 1.81 u[IU]/mL (ref 0.35–4.50)

## 2018-10-12 NOTE — Progress Notes (Signed)
Patient ID: Michele [name], female   DOB: 11/23/1991, 27 y.o.   MRN: [id number]   Subjective:    Patient ID: Michele [name], female    DOB: 12-04-1991, 27 y.o.   MRN: [id number]  HPI  Patient here for her physical exam.   She reports she is doing relatively well.  Reports some hair loss.  Taking biotin.  Trying to stay active.  No chest pain.  No sob.  No acid reflux.  No abdominal pain.  Bowels moving.  For the last 3 months, periods have been heavier.  May last 5-6 days.  Regular.  No spotting.  Taking ocp's.  Married.  Just moved into a new house.  Overall she feels she is doing well.     Past Medical History:  Diagnosis Date  . History of chicken pox   . Hx of migraines   . Hx: UTI (urinary tract infection)   . Hyperlipidemia   . Palpitations    Past Surgical History:  Procedure Laterality Date  . TONSILLECTOMY AND ADENOIDECTOMY  1997   Family History  Problem Relation Age of Onset  . Hyperlipidemia Father   . Hypertension Father   . Hypertension Mother   . Breast cancer Maternal Grandmother   . Prostate cancer Maternal Grandfather   . Heart disease Maternal Grandfather   . Hypertension Maternal Grandfather   . Diabetes Maternal Grandfather   . Breast cancer Paternal Grandmother   . Hyperlipidemia Paternal Grandmother   . Hypertension Paternal Grandmother   . Diabetes Paternal Grandmother   . Mental illness Paternal Grandmother   . Hyperlipidemia Paternal Grandfather   . Stroke Paternal Grandfather   . Hypertension Paternal Grandfather    Social History   Socioeconomic History  . Marital status: Married    Spouse name: Not on file  . Number of children: 0  . Years of education: Not on file  . Highest education level: Not on file  Occupational History  . Not on file  Social Needs  . Financial resource strain: Not on file  . Food insecurity:    Worry: Not on file    Inability: Not on file  . Transportation needs:    Medical: Not on file   Non-medical: Not on file  Tobacco Use  . Smoking status: Never Smoker  . Smokeless tobacco: Never Used  Substance and Sexual Activity  . Alcohol use: Yes    Alcohol/week: 0.0 standard drinks  . Drug use: No  . Sexual activity: Yes  Lifestyle  . Physical activity:    Days per week: Not on file    Minutes per session: Not on file  . Stress: Not on file  Relationships  . Social connections:    Talks on phone: Not on file    Gets together: Not on file    Attends religious service: Not on file    Active member of club or organization: Not on file    Attends meetings of clubs or organizations: Not on file    Relationship status: Not on file  Other Topics Concern  . Not on file  Social History Narrative  . Not on file    Outpatient Encounter Medications as of 10/12/2018  Medication Sig  . aluminum chloride (DRYSOL) 20 % external solution Apply topically at bedtime.  . famotidine (PEPCID) 20 MG tablet Take 1 tablet (20 mg total) by mouth 2 (two) times daily.  Marland Kitchen ketoconazole (NIZORAL) 2 % shampoo   . Magnesium 500 MG TABS  Take 1,000 mg by mouth daily.  Marland Kitchen MELATONIN PO Take by mouth as needed (for sleep).  . nebivolol (BYSTOLIC) 5 MG tablet Take 5 mg by mouth as needed.  . norethindrone (ERRIN) 0.35 MG tablet TAKE (1) TABLET BY MOUTH EVERY DAY  . omeprazole (PRILOSEC) 20 MG capsule TK ONE C PO  30 MINUTES PRIOR TO BREAKFAST   No facility-administered encounter medications on file as of 10/12/2018.     Review of Systems  Constitutional: Negative for appetite change and unexpected weight change.  HENT: Negative for congestion and sinus pressure.   Eyes: Negative for pain and visual disturbance.  Respiratory: Negative for cough, chest tightness and shortness of breath.   Cardiovascular: Negative for chest pain, palpitations and leg swelling.  Gastrointestinal: Negative for abdominal pain, diarrhea, nausea and vomiting.  Genitourinary: Negative for difficulty urinating and dysuria.    Musculoskeletal: Negative for joint swelling and myalgias.  Skin: Negative for color change and rash.  Neurological: Negative for dizziness, light-headedness and headaches.  Hematological: Negative for adenopathy. Does not bruise/bleed easily.  Psychiatric/Behavioral: Negative for agitation and dysphoric mood.       Objective:    Physical Exam  Constitutional: She is oriented to person, place, and time. She appears well-developed and well-nourished. No distress.  HENT:  Nose: Nose normal.  Mouth/Throat: Oropharynx is clear and moist.  Eyes: Right eye exhibits no discharge. Left eye exhibits no discharge. No scleral icterus.  Neck: Neck supple. No thyromegaly present.  Cardiovascular: Normal rate and regular rhythm.  Pulmonary/Chest: Breath sounds normal. No accessory muscle usage. No tachypnea. No respiratory distress. She has no decreased breath sounds. She has no wheezes. She has no rhonchi. Right breast exhibits no inverted nipple, no mass, no nipple discharge and no tenderness (no axillary adenopathy). Left breast exhibits no inverted nipple, no mass, no nipple discharge and no tenderness (no axilarry adenopathy).  Abdominal: Soft. Bowel sounds are normal. There is no tenderness.  Musculoskeletal: She exhibits no edema or tenderness.  Lymphadenopathy:    She has no cervical adenopathy.  Neurological: She is alert and oriented to person, place, and time.  Skin: No rash noted. No erythema.  Psychiatric: She has a normal mood and affect. Her behavior is normal.    BP 108/70 (BP Location: Left Arm, Patient Position: Sitting, Cuff Size: Normal)   Pulse (!) 50   Temp 98.2 F (36.8 C) (Oral)   Resp 18   Ht 5' 7"  (1.702 m)   Wt 210 lb 6.4 oz (95.4 kg)   SpO2 98%   BMI 32.95 kg/m  Wt Readings from Last 3 Encounters:  10/12/18 210 lb 6.4 oz (95.4 kg)  07/03/18 215 lb (97.5 kg)  03/07/18 215 lb (97.5 kg)     Lab Results  Component Value Date   WBC 8.2 10/12/2018   HGB 15.3  (H) 10/12/2018   HCT 44.4 10/12/2018   PLT 275.0 10/12/2018   GLUCOSE 76 10/12/2018   CHOL 176 10/12/2018   TRIG 88.0 10/12/2018   HDL 50.00 10/12/2018   LDLCALC 108 (H) 10/12/2018   ALT 14 10/12/2018   AST 12 10/12/2018   NA 139 10/12/2018   K 4.5 10/12/2018   CL 104 10/12/2018   CREATININE 0.78 10/12/2018   BUN 10 10/12/2018   CO2 26 10/12/2018   TSH 1.81 10/12/2018   HGBA1C 5.0 08/04/2016       Assessment & Plan:   Problem List Items Addressed This Visit    Frequent PVCs  Has seen cardiology.  Stable.  Currently asymptomatic.        Hair loss    Check cbc, met c and tsh.  Refer to dermatology if persistent.        Relevant Orders   CBC with Differential/Platelet (Completed)   Comprehensive metabolic panel (Completed)   Health care maintenance    Physical today 10/12/18.  PAP 05/25/17       Hypercholesterolemia - Primary    Low cholesterol diet and exercise.  Follow lipid panel.        Relevant Orders   Lipid panel (Completed)   Menstrual changes    Heavier periods over the last few months.  On ocp's.  Follow.  Check routine labs.        Relevant Orders   TSH (Completed)       Einar Pheasant, MD

## 2018-10-12 NOTE — Assessment & Plan Note (Signed)
Physical today 10/12/18.  PAP 05/25/17

## 2018-10-13 ENCOUNTER — Encounter: Payer: Self-pay | Admitting: Internal Medicine

## 2018-10-16 ENCOUNTER — Encounter: Payer: Self-pay | Admitting: Internal Medicine

## 2018-10-16 NOTE — Assessment & Plan Note (Signed)
Low cholesterol diet and exercise.  Follow lipid panel.   

## 2018-10-16 NOTE — Assessment & Plan Note (Signed)
Has seen cardiology.  Stable.  Currently asymptomatic.

## 2018-10-16 NOTE — Assessment & Plan Note (Signed)
Heavier periods over the last few months.  On ocp's.  Follow.  Check routine labs.

## 2018-10-16 NOTE — Assessment & Plan Note (Signed)
Check cbc, met c and tsh.  Refer to dermatology if persistent.

## 2018-10-19 MED ORDER — NORETHINDRONE 0.35 MG PO TABS: ORAL_TABLET | ORAL | 12 refills | Status: DC

## 2018-10-19 NOTE — Telephone Encounter (Signed)
rx sent in for refill ocp's.

## 2018-10-26 ENCOUNTER — Telehealth: Payer: Self-pay | Admitting: Internal Medicine

## 2018-10-26 NOTE — Telephone Encounter (Signed)
Okay to refill? No refills were sent during last refill.

## 2018-10-27 NOTE — Telephone Encounter (Signed)
Patient returning call to [place]- Patient states she is at work and will try calling again at 3:35 this afternoon.

## 2018-10-27 NOTE — Telephone Encounter (Signed)
I sent the rx for her ocp's in again.  It appears that on 10/19/18 the rx was sent in to [facility].  This request is for [facility].  I sent in rx to [facility] for one year.  Please call and cancel the rx at [facility] and notify pt of rx to [facility] (and that previous rx went to wrong pharmacy).  Also confirm she has not missed any doses and no concern regarding pregnancy before starting.

## 2018-10-27 NOTE — Telephone Encounter (Signed)
Patient returning Trisha's call. Advised script would be changed but please advice on pregnancy concern.

## 2018-10-27 NOTE — Telephone Encounter (Signed)
Please see note & advise?

## 2018-10-28 NOTE — Telephone Encounter (Signed)
Pt calling again to speak with Bethann Berkshire - pt will be getting off work at 3:30 and would like a call back after that time.

## 2018-10-28 NOTE — Telephone Encounter (Signed)
Please advise 

## 2018-10-28 NOTE — Telephone Encounter (Signed)
Patient did not miss any doses of her birth control and picked her rx last night

## 2019-01-02 ENCOUNTER — Telehealth: Payer: BLUE CROSS/BLUE SHIELD | Admitting: Physician Assistant

## 2019-01-02 DIAGNOSIS — R300 Dysuria: Secondary | ICD-10-CM

## 2019-01-02 MED ORDER — CEPHALEXIN 500 MG PO CAPS: 500.0000 mg | ORAL_CAPSULE | Freq: Two times a day (BID) | ORAL | 0 refills | Status: AC

## 2019-01-02 NOTE — Progress Notes (Signed)

## 2019-01-31 ENCOUNTER — Encounter: Payer: Self-pay | Admitting: Internal Medicine

## 2019-02-01 NOTE — Telephone Encounter (Signed)
We have a 4:00 appt open on Thursday. If no acute sx, would you like to see her then? BP readings are attached to this message

## 2019-02-01 NOTE — Telephone Encounter (Signed)
Ok to work in.

## 2019-02-02 NOTE — Telephone Encounter (Signed)
Pt scheduled with Lauren due to her not being able to come in for any of the work in appts given. Advised she could f/u with Dr. Lorin Picket

## 2019-02-08 ENCOUNTER — Ambulatory Visit (INDEPENDENT_AMBULATORY_CARE_PROVIDER_SITE_OTHER): Payer: Commercial Managed Care - PPO | Admitting: Family Medicine

## 2019-02-08 ENCOUNTER — Encounter: Payer: Self-pay | Admitting: Family Medicine

## 2019-02-08 VITALS — BP 122/80 | HR 87 | Temp 98.3°F | Resp 18 | Ht 67.0 in | Wt 224.8 lb

## 2019-02-08 DIAGNOSIS — R030 Elevated blood-pressure reading, without diagnosis of hypertension: Secondary | ICD-10-CM

## 2019-02-08 NOTE — Progress Notes (Signed)
Subjective:    Patient ID: Michele [name], female    DOB: October 29, 1991, 29 y.o.   MRN: [id number]  HPI   Patient presents to clinic due to elevated BP reading.  About 1 week ago [facility] went to [facility] a [facility] infection/ear pain issue.  Blood pressure when at past med was in the 160s over 90s range.  Patient became concerned about this, upon returning home from the urgent care she checked her blood pressure with her home blood pressure cuff.  States she checked her BP multiple times about 1 to 2 minutes apart and was getting readings all over the map including 170/110, 130/64, 117/70.  Patient reports no abnormal symptoms at the time of blood pressure readings being all over, and continues to have no symptoms.  No chest pain, no shortness of breath, no palpitations, no wheezing, no dizziness, no headache, no feeling faint or passing out.   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  Patient states she has not taken the Bystolic in almost 2 years.  Patient Active Problem List   Diagnosis Date Noted  . Hair loss 10/12/2018  . Menstrual changes 10/12/2018  . Low back pain 05/04/2016  . Encounter for completion of form with patient 07/31/2015  . Health care maintenance 01/31/2015  . 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 Sinusitis 09/01/2013  . Palpitations 07/15/2013  . Hx of migraines 07/15/2013  . Hypercholesterolemia 07/15/2013  . UTI (urinary tract infection) 07/15/2013   Social History   Tobacco Use  . Smoking status: Never Smoker  . Smokeless tobacco: Never Used  Substance Use Topics  . Alcohol use: Yes    Alcohol/week: 0.0 standard drinks   Review of Systems   Constitutional: Negative for chills, fatigue and fever.  HENT: Negative for congestion, ear pain, sinus pain and sore throat.   Eyes: Negative.   Respiratory: Negative for cough, shortness of breath and wheezing.   Cardiovascular: Negative for chest pain,  palpitations and leg swelling.  Gastrointestinal: Negative for abdominal pain, diarrhea, nausea and vomiting.  Genitourinary: Negative for dysuria, frequency and urgency.  Musculoskeletal: Negative for arthralgias and myalgias.  Skin: Negative for color change, pallor and rash.  Neurological: Negative for syncope, light-headedness and headaches.  Psychiatric/Behavioral: The patient is not nervous/anxious.       Objective:   Physical Exam Vitals signs and nursing note reviewed.  Constitutional:      General: She is not in acute distress.    Appearance: She is well-developed. She is not toxic-appearing.  HENT:     Head: Normocephalic and atraumatic.     Right Ear: Tympanic membrane, ear canal and external ear normal.     Left Ear: Tympanic membrane, ear canal and external ear normal.     Nose: Nose normal.     Mouth/Throat:     Mouth: Mucous membranes are moist.  Eyes:     General: No scleral icterus.    Extraocular Movements: Extraocular movements intact.     Conjunctiva/sclera: Conjunctivae normal.     Pupils: Pupils are equal, round, and reactive to light.  Neck:     Musculoskeletal: Neck supple. No neck rigidity.     Vascular: No carotid bruit.     Trachea: No tracheal deviation.  Cardiovascular:     Rate and Rhythm: Normal rate and regular rhythm.     Heart sounds: Normal heart sounds. No murmur. No friction rub. No  gallop.   Pulmonary:     Effort: Pulmonary effort is normal. No respiratory distress.     Breath sounds: Normal breath sounds. No wheezing, rhonchi or rales.  Lymphadenopathy:     Cervical: No cervical adenopathy.  Skin:    General: Skin is warm and dry.     Capillary Refill: Capillary refill takes less than 2 seconds.     Coloration: Skin is not jaundiced or pale.  Neurological:     Mental Status: She is alert and oriented to person, place, and time.     Gait: Gait normal.  Psychiatric:        Mood and Affect: Mood normal.        Behavior: Behavior  normal.        Thought Content: Thought content normal.    Wt Readings from Last 3 Encounters:  02/08/19 224 lb 12.8 oz (102 kg)  10/12/18 210 lb 6.4 oz (95.4 kg)  07/03/18 215 lb (97.5 kg)   BP Readings from Last 3 Encounters:  02/08/19 130/82  10/12/18 108/70  07/03/18 (!) 141/84    Vitals:   02/08/19 0839 02/08/19 0854  BP: 130/82 122/80  Pulse: 87   Resp: 18   Temp: 98.3 F (36.8 C)   SpO2: 98%        Assessment & Plan:    A total of 25  minutes were spent face-to-face with the patient during this encounter and over half of that time was spent on counseling and coordination of care. The patient was counseled on how hypertension is diagnosed, how and when to check BP readings at home to watch for trends.     Elevated blood-pressure reading without diagnosis hypertension- blood pressure checked in each arm separated by 15 minutes in clinic today, both readings were very good.  Patient advised to keep a log of blood pressure readings over the next 1 to 2 weeks and look for any trends.  Advised been checking blood pressure over the next 1 to 2 weeks to check a either 1 or 2 times per day, and to separate each reading by at least 4 hours.  Advised patient that sometimes we can have an elevated reading, but we would not add a medication to control blood pressure based off of 1 or 2 elevated readings.  Advised that if readings trend high during the 140/90 ranges to call and let us know.  She will keep already scheduled follow-up as planned with PCP.  Advised to return to clinic sooner if any issues arise or if blood pressure readings continue to be elevated.

## 2019-02-08 NOTE — Patient Instructions (Signed)
Over the next 1 to 2 weeks, check blood pressure 1 or 2 times per day when checking blood pressure more than once per day separate the BP check by at least 4 hours.  Keep a log of these readings, if he started noticed them trending high consistently over 140/90, please call and let us know.   Also if you develop any symptoms including chest pain, palpitations, dizziness, headache, weakness, shortness of breath please let us know.

## 2019-05-17 ENCOUNTER — Encounter: Payer: Self-pay | Admitting: Internal Medicine

## 2019-05-17 NOTE — Telephone Encounter (Signed)
See response to other mychart message

## 2019-05-17 NOTE — Telephone Encounter (Signed)
Agree.  Need to know how she is doing now.  Has bleeding stopped?  Any other symptoms - fever, abdominal pain, etc?  If no other symptoms and no bleeding now, schedule virtual visit to discuss.

## 2019-07-10 ENCOUNTER — Encounter: Payer: Self-pay | Admitting: Internal Medicine

## 2019-07-11 NOTE — Progress Notes (Unsigned)
pls return call. Wannetta Sender was trying to work a a sch appt w/t [employer] call back asap at [phone]

## 2019-07-11 NOTE — Telephone Encounter (Signed)
Ok to schedule 4:30 tomorrow and see if you can schedule her for lab visit on Wednesday and I can place order for labs based on visit.

## 2019-07-11 NOTE — Telephone Encounter (Signed)
Patient is agreeable to do a virtual visit. Stated she would need early morning or could do afternoon. Are you ok with adding a 4:30 one day this week or next week?

## 2019-07-11 NOTE — Telephone Encounter (Signed)
LMTCB

## 2019-07-11 NOTE — Telephone Encounter (Signed)
Yes can schedule appt if problems and can order labs.

## 2019-07-11 NOTE — Telephone Encounter (Signed)
Are you okay with doing fasting labs on her this week and doing a virtual to discuss if she is agreeable?

## 2019-07-12 ENCOUNTER — Other Ambulatory Visit: Payer: Self-pay

## 2019-07-12 ENCOUNTER — Ambulatory Visit (INDEPENDENT_AMBULATORY_CARE_PROVIDER_SITE_OTHER): Payer: Commercial Managed Care - PPO | Admitting: Internal Medicine

## 2019-07-12 ENCOUNTER — Encounter: Payer: Self-pay | Admitting: Internal Medicine

## 2019-07-12 DIAGNOSIS — L659 Nonscarring hair loss, unspecified: Secondary | ICD-10-CM | POA: Diagnosis not present

## 2019-07-12 DIAGNOSIS — E7800 Pure hypercholesterolemia, unspecified: Secondary | ICD-10-CM | POA: Diagnosis not present

## 2019-07-12 DIAGNOSIS — I493 Ventricular premature depolarization: Secondary | ICD-10-CM

## 2019-07-12 DIAGNOSIS — R631 Polydipsia: Secondary | ICD-10-CM | POA: Diagnosis not present

## 2019-07-12 NOTE — Telephone Encounter (Signed)
Pt scheduled for labs next week and appt today. There were no fasting lab slots available for tomorrow

## 2019-07-12 NOTE — Progress Notes (Signed)
Michele [name] ID: Michele [name], female   DOB: 1991-08-19, 28 y.o.   MRN: [id number]   Virtual Visit via video Note  This visit type was conducted due to national recommendations for restrictions regarding the COVID-19 pandemic (e.g. social distancing).  This format is felt to be most appropriate for this Michele [name] at this time.  All issues noted in this document were discussed and addressed.  No physical exam was performed (except for noted visual exam findings with Video Visits).   I connected with Festus Holts by a video enabled telemedicine application and verified that I am speaking with the correct person using two identifiers. Location Michele [name]: home Location provider: work Persons participating in the virtual visit: Michele [name], provider  I discussed the limitations, risks, security and privacy concerns of performing an evaluation and management service by video and the availability of in person appointments.  [name] expressed understanding and agreed to proceed.   Reason for visit: work in appt.   HPI: Requested work in appt to discuss being checked for diabetes.  States she has had intermittent issues for the past 1.5 years.  Some intermittent increased thirst.  Some increased urinary frequency.  Reports noticing symptoms now.  States notices more the week before her period or the week after her period.  Some occasional nausea with acid reflux.  No abdominal pain.  No fever.  No chest congestion or sob.  Bowels stable.  Reports noticing some hair loss.     ROS: See pertinent positives and negatives per HPI.  Past Medical History:  Diagnosis Date  . History of chicken pox   . Hx of migraines   . Hx: UTI (urinary tract infection)   . Hyperlipidemia   . Palpitations     Past Surgical History:  Procedure Laterality Date  . TONSILLECTOMY AND ADENOIDECTOMY  1997    Family History  Problem Relation Age of Onset  . Hyperlipidemia Father   . Hypertension Father   . Hypertension  Mother   . Breast cancer Maternal Grandmother   . Prostate cancer Maternal Grandfather   . Heart disease Maternal Grandfather   . Hypertension Maternal Grandfather   . Diabetes Maternal Grandfather   . Breast cancer Paternal Grandmother   . Hyperlipidemia Paternal Grandmother   . Hypertension Paternal Grandmother   . Diabetes Paternal Grandmother   . Mental illness Paternal Grandmother   . Hyperlipidemia Paternal Grandfather   . Stroke Paternal Grandfather   . Hypertension Paternal Grandfather     SOCIAL HX: reviewed.    Current Outpatient Medications:  .  aluminum chloride (DRYSOL) 20 % external solution, Apply topically at bedtime., Disp: 35 mL, Rfl: 0 .  famotidine (PEPCID) 20 MG tablet, Take 1 tablet (20 mg total) by mouth 2 (two) times daily., Disp: 60 tablet, Rfl: 0 .  ketoconazole (NIZORAL) 2 % shampoo, , Disp: , Rfl: 3 .  Magnesium 500 MG TABS, Take 1,000 mg by mouth daily., Disp: , Rfl:  .  MELATONIN PO, Take by mouth as needed (for sleep)., Disp: , Rfl:  .  nebivolol (BYSTOLIC) 5 MG tablet, Take 5 mg by mouth as needed., Disp: , Rfl:  .  norethindrone (ERRIN) 0.35 MG tablet, TAKE (1) TABLET BY MOUTH EVERY DAY, Disp: 1 Package, Rfl: 12 .  NORLYDA 0.35 MG tablet, TAKE 1 TABLET BY MOUTH EVERY DAY, Disp: 28 tablet, Rfl: 12 .  omeprazole (PRILOSEC) 20 MG capsule, TK ONE C PO  30 MINUTES PRIOR TO BREAKFAST, Disp: , Rfl: 1  EXAM:  GENERAL: alert, oriented, appears well and in no acute distress  HEENT: atraumatic, conjunttiva clear, no obvious abnormalities on inspection of external nose and ears  NECK: normal movements of the head and neck  LUNGS: on inspection no signs of respiratory distress, breathing rate appears normal, no obvious gross SOB, gasping or wheezing  CV: no obvious cyanosis  PSYCH/NEURO: pleasant and cooperative, no obvious depression or anxiety, speech and thought processing grossly intact  ASSESSMENT AND PLAN:  Discussed the following assessment and  plan:  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  Reports is stable.  Follow.   Hair loss Recheck labs - cbc, thyroid and metabolic panel.    Hypercholesterolemia Low cholesterol diet and exercise.  Follow lipid panel.   Increased thirst Noticed increased thirst and some intermittent increased urinary frequency.  Check fasting glucose and a1c.  Check urine.  Follow.      I discussed the assessment and treatment plan with [name]. [name] was provided an opportunity to ask questions and all were answered. [name] agreed with the plan and demonstrated an understanding of the instructions.   [name] was advised to call back or seek an in-person evaluation if the symptoms worsen or if the condition fails to improve as anticipated.   Einar Pheasant, MD

## 2019-07-17 ENCOUNTER — Encounter: Payer: Self-pay | Admitting: Internal Medicine

## 2019-07-17 DIAGNOSIS — R631 Polydipsia: Secondary | ICD-10-CM | POA: Insufficient documentation

## 2019-07-17 NOTE — Assessment & Plan Note (Signed)
Has seen cardiology.  Reports is stable.  Follow.

## 2019-07-17 NOTE — Assessment & Plan Note (Signed)
Low cholesterol diet and exercise.  Follow lipid panel.   

## 2019-07-17 NOTE — Assessment & Plan Note (Signed)
Recheck labs - cbc, thyroid and metabolic panel.

## 2019-07-17 NOTE — Assessment & Plan Note (Signed)
Noticed increased thirst and some intermittent increased urinary frequency.  Check fasting glucose and a1c.  Check urine.  Follow.

## 2019-07-21 ENCOUNTER — Ambulatory Visit (INDEPENDENT_AMBULATORY_CARE_PROVIDER_SITE_OTHER): Payer: Commercial Managed Care - PPO | Admitting: Internal Medicine

## 2019-07-21 ENCOUNTER — Encounter: Payer: Self-pay | Admitting: Internal Medicine

## 2019-07-21 ENCOUNTER — Other Ambulatory Visit: Payer: Self-pay

## 2019-07-21 DIAGNOSIS — R631 Polydipsia: Secondary | ICD-10-CM

## 2019-07-21 DIAGNOSIS — L659 Nonscarring hair loss, unspecified: Secondary | ICD-10-CM | POA: Diagnosis not present

## 2019-07-21 LAB — POCT GLYCOSYLATED HEMOGLOBIN (HGB A1C): Hemoglobin A1C: 5.1 % (ref 4.0–5.6)

## 2019-07-21 LAB — BASIC METABOLIC PANEL
BUN: 8 mg/dL (ref 6–23)
CO2: 23 mEq/L (ref 19–32)
Calcium: 9.3 mg/dL (ref 8.4–10.5)
Chloride: 105 mEq/L (ref 96–112)
Creatinine, Ser: 0.8 mg/dL (ref 0.40–1.20)
GFR: 84.52 mL/min (ref >60.00)
Glucose, Bld: 78 mg/dL (ref 70–99)
Potassium: 4.2 mEq/L (ref 3.5–5.1)
Sodium: 138 mEq/L (ref 135–145)

## 2019-07-21 LAB — TSH: TSH: 1.95 u[IU]/mL (ref 0.35–4.50)

## 2019-07-21 NOTE — Progress Notes (Signed)
Patient ID: Michele [name], female   DOB: 27-Oct-1991, 28 y.o.   MRN: [id number] Pt in for labs.  No office visit.

## 2019-07-22 ENCOUNTER — Encounter: Payer: Self-pay | Admitting: Internal Medicine

## 2019-07-22 ENCOUNTER — Other Ambulatory Visit: Payer: Commercial Managed Care - PPO

## 2019-07-22 ENCOUNTER — Other Ambulatory Visit: Payer: Self-pay

## 2019-07-22 DIAGNOSIS — R631 Polydipsia: Secondary | ICD-10-CM

## 2019-07-22 NOTE — Addendum Note (Signed)
Addended by: WRIGHT, LATOYA S on: 07/22/2019 03:07 PM   Modules accepted: Orders  

## 2019-07-23 LAB — CBC WITH DIFFERENTIAL/PLATELET
Absolute Monocytes: 732 cells/uL (ref 200–950)
Basophils Absolute: 67 cells/uL (ref 0–200)
Basophils Relative: 0.7 %
Eosinophils Absolute: 181 cells/uL (ref 15–500)
Eosinophils Relative: 1.9 %
HCT: 42.1 % (ref 35.0–45.0)
Hemoglobin: 14.6 g/dL (ref 11.7–15.5)
Lymphs Abs: 3905 cells/uL — ABNORMAL HIGH (ref 850–3900)
MCH: 33.2 pg — ABNORMAL HIGH (ref 27.0–33.0)
MCHC: 34.7 g/dL (ref 32.0–36.0)
MCV: 95.7 fL (ref 80.0–100.0)
MPV: 10.6 fL (ref 7.5–12.5)
Monocytes Relative: 7.7 %
Neutro Abs: 4617 cells/uL (ref 1500–7800)
Neutrophils Relative %: 48.6 %
Platelets: 269 10*3/uL (ref 140–400)
RBC: 4.4 10*6/uL (ref 3.80–5.10)
RDW: 12.1 % (ref 11.0–15.0)
Total Lymphocyte: 41.1 %
WBC: 9.5 10*3/uL (ref 3.8–10.8)

## 2019-08-30 ENCOUNTER — Encounter: Payer: Self-pay | Admitting: Internal Medicine

## 2019-10-14 ENCOUNTER — Encounter: Payer: BLUE CROSS/BLUE SHIELD | Admitting: Internal Medicine

## 2019-10-23 ENCOUNTER — Other Ambulatory Visit: Payer: Self-pay | Admitting: Internal Medicine

## 2019-12-13 ENCOUNTER — Ambulatory Visit (INDEPENDENT_AMBULATORY_CARE_PROVIDER_SITE_OTHER): Payer: Commercial Managed Care - PPO | Admitting: Internal Medicine

## 2019-12-13 ENCOUNTER — Encounter: Payer: Self-pay | Admitting: Internal Medicine

## 2019-12-13 ENCOUNTER — Other Ambulatory Visit: Payer: Self-pay

## 2019-12-13 VITALS — Ht 68.0 in | Wt 220.0 lb

## 2019-12-13 DIAGNOSIS — R631 Polydipsia: Secondary | ICD-10-CM

## 2019-12-13 DIAGNOSIS — E7800 Pure hypercholesterolemia, unspecified: Secondary | ICD-10-CM | POA: Diagnosis not present

## 2019-12-13 DIAGNOSIS — I493 Ventricular premature depolarization: Secondary | ICD-10-CM

## 2019-12-13 DIAGNOSIS — Z0000 Encounter for general adult medical examination without abnormal findings: Secondary | ICD-10-CM

## 2019-12-13 NOTE — Progress Notes (Signed)
Patient ID: Michele [name], female   DOB: 12-Dec-1991, 28 y.o.   MRN: [id number]   Virtual Visit via video Note  This visit type was conducted due to national recommendations for restrictions regarding the COVID-19 pandemic (e.g. social distancing).  This format is felt to be most appropriate for this patient at this time.  All issues noted in this document were discussed and addressed.  No physical exam was performed (except for noted visual exam findings with Video Visits).   I connected with Festus Holts by a video enabled telemedicine application and verified that I am speaking with the correct person using two identifiers. Location patient: home Location provider: work  Persons participating in the virtual visit: patient, provider  The limitations, risks, security and privacy concerns of performing an evaluation and management service by video and the availability of in person appointments were discussed.   The patient expressed understanding and agreed to proceed.   Reason for visit: scheduled follow up.   HPI: She reports she is doing well.  Feels good.  Trying to stay active.  Doing some home exercise routines.  No chest pain or sob reported.  Occasional flutter of her heart.  No syncope or near syncope.  Some occasional acid reflux.  Notices if eat something spicy.  Does not happen regularly.  No abdominal pain reported.  Periods regular.  Off ocp's.  Planning to try to get pregnant in the near future.  No bowel change.  Overall feels good.  Handling stress.  Working.     ROS: See pertinent positives and negatives per HPI.  Past Medical History:  Diagnosis Date  . History of chicken pox   . Hx of migraines   . Hx: UTI (urinary tract infection)   . Hyperlipidemia   . Palpitations     Past Surgical History:  Procedure Laterality Date  . TONSILLECTOMY AND ADENOIDECTOMY  1997    Family History  Problem Relation Age of Onset  . Hyperlipidemia Father   . Hypertension  Father   . Hypertension Mother   . Breast cancer Maternal Grandmother   . Prostate cancer Maternal Grandfather   . Heart disease Maternal Grandfather   . Hypertension Maternal Grandfather   . Diabetes Maternal Grandfather   . Breast cancer Paternal Grandmother   . Hyperlipidemia Paternal Grandmother   . Hypertension Paternal Grandmother   . Diabetes Paternal Grandmother   . Mental illness Paternal Grandmother   . Hyperlipidemia Paternal Grandfather   . Stroke Paternal Grandfather   . Hypertension Paternal Grandfather     SOCIAL HX: reviewed.    Current Outpatient Medications:  .  aluminum chloride (DRYSOL) 20 % external solution, Apply topically at bedtime., Disp: 35 mL, Rfl: 0 .  famotidine (PEPCID) 20 MG tablet, Take 1 tablet (20 mg total) by mouth 2 (two) times daily., Disp: 60 tablet, Rfl: 0 .  ketoconazole (NIZORAL) 2 % shampoo, , Disp: , Rfl: 3 .  Magnesium 500 MG TABS, Take 1,000 mg by mouth daily., Disp: , Rfl:  .  MELATONIN PO, Take by mouth as needed (for sleep)., Disp: , Rfl:  .  nebivolol (BYSTOLIC) 5 MG tablet, Take 5 mg by mouth as needed., Disp: , Rfl:  .  omeprazole (PRILOSEC) 20 MG capsule, TK ONE C PO  30 MINUTES PRIOR TO BREAKFAST, Disp: , Rfl: 1  EXAM:  GENERAL: alert, oriented, appears well and in no acute distress  HEENT: atraumatic, conjunttiva clear, no obvious abnormalities on inspection of external nose and  ears  NECK: normal movements of the head and neck  LUNGS: on inspection no signs of respiratory distress, breathing rate appears normal, no obvious gross SOB, gasping or wheezing  CV: no obvious cyanosis  PSYCH/NEURO: pleasant and cooperative, no obvious depression or anxiety, speech and thought processing grossly intact  ASSESSMENT AND PLAN:  Discussed the following assessment and plan:  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  Stable.  Follow.    Hypercholesterolemia Low cholesterol diet and exercise.  Follow lipid panel.   Increased  thirst Better.  Not a significant issue for her now.   Health care maintenance Virtual physical today.  PAP 05/2017 - negative with negative HPV.      I discussed the assessment and treatment plan with the patient. The patient was provided an opportunity to ask questions and all were answered. The patient agreed with the plan and demonstrated an understanding of the instructions.   The patient was advised to call back or seek an in-person evaluation if the symptoms worsen or if the condition fails to improve as anticipated.   Einar Pheasant, MD

## 2019-12-18 ENCOUNTER — Encounter: Payer: Self-pay | Admitting: Internal Medicine

## 2019-12-18 NOTE — Assessment & Plan Note (Signed)
Better.  Not a significant issue for her now.

## 2019-12-18 NOTE — Assessment & Plan Note (Signed)
Has seen cardiology.  Stable.  Follow.

## 2019-12-18 NOTE — Assessment & Plan Note (Signed)
Low cholesterol diet and exercise.  Follow lipid panel.   

## 2019-12-18 NOTE — Assessment & Plan Note (Signed)
Virtual physical today.  PAP 05/2017 - negative with negative HPV.

## 2019-12-27 ENCOUNTER — Encounter: Payer: Self-pay | Admitting: Internal Medicine

## 2020-02-04 ENCOUNTER — Encounter: Payer: Self-pay | Admitting: Internal Medicine

## 2020-02-08 ENCOUNTER — Other Ambulatory Visit (INDEPENDENT_AMBULATORY_CARE_PROVIDER_SITE_OTHER): Payer: Commercial Managed Care - PPO

## 2020-02-08 ENCOUNTER — Encounter: Payer: Self-pay | Admitting: Internal Medicine

## 2020-02-08 ENCOUNTER — Ambulatory Visit (INDEPENDENT_AMBULATORY_CARE_PROVIDER_SITE_OTHER): Payer: Commercial Managed Care - PPO | Admitting: Internal Medicine

## 2020-02-08 ENCOUNTER — Other Ambulatory Visit: Payer: Self-pay

## 2020-02-08 DIAGNOSIS — K625 Hemorrhage of anus and rectum: Secondary | ICD-10-CM

## 2020-02-08 NOTE — Progress Notes (Signed)
Patient ID: Michele [name], female   DOB: [birth year], 29 y.o.   MRN: [id number]   Virtual Visit via video Note  This visit type was conducted due to national recommendations for restrictions regarding the COVID-19 pandemic (e.g. social distancing).  This format is felt to be most appropriate for this patient at this time.  All issues noted in this document were discussed and addressed.  No physical exam was performed (except for noted visual exam findings with Video Visits).   I connected with Stanford Breed by a video enabled telemedicine application or telephone and verified that I am speaking with the correct person using two identifiers. Location patient: home Location provider: work or home office Persons participating in the virtual visit: patient, provider  I discussed the limitations, risks, security and privacy concerns of performing an evaluation and management service by telephone and the availability of in person appointments. I also discussed with the patient that there may be a patient responsible charge related to this service. The patient expressed understanding and agreed to proceed.   Reason for visit: work in appt.   HPI: Reports that she noticed a couple of months ago - noticed blood with bowel movement - drop of blood.  Was questioning if hemorrhoid.  Noticed blood again recently.  Some increased bleeding with this episode.  Did not appear to be mixed in the stool.  No fever.  No abdominal pain.  Reports no problems with increased constipation.  Eating.  No nausea or vomiting.  Does report has noticed stool - like acid/yellow - at times.  Blood - described as BRB.  No melana.  No bleeding currently.     ROS: See pertinent positives and negatives per HPI.  Past Medical History:  Diagnosis Date  . History of chicken pox   . Hx of migraines   . Hx: UTI (urinary tract infection)   . Hyperlipidemia   . Palpitations     Past Surgical History:  Procedure Laterality  Date  . TONSILLECTOMY AND ADENOIDECTOMY  1997    Family History  Problem Relation Age of Onset  . Hyperlipidemia Father   . Hypertension Father   . Hypertension Mother   . Breast cancer Maternal Grandmother   . Prostate cancer Maternal Grandfather   . Heart disease Maternal Grandfather   . Hypertension Maternal Grandfather   . Diabetes Maternal Grandfather   . Breast cancer Paternal Grandmother   . Hyperlipidemia Paternal Grandmother   . Hypertension Paternal Grandmother   . Diabetes Paternal Grandmother   . Mental illness Paternal Grandmother   . Hyperlipidemia Paternal Grandfather   . Stroke Paternal Grandfather   . Hypertension Paternal Grandfather     SOCIAL HX: reviewed.    Current Outpatient Medications:  .  aluminum chloride (DRYSOL) 20 % external solution, Apply topically at bedtime., Disp: 35 mL, Rfl: 0 .  famotidine (PEPCID) 20 MG tablet, Take 1 tablet (20 mg total) by mouth 2 (two) times daily., Disp: 60 tablet, Rfl: 0 .  ketoconazole (NIZORAL) 2 % shampoo, , Disp: , Rfl: 3 .  Magnesium 500 MG TABS, Take 1,000 mg by mouth daily., Disp: , Rfl:  .  MELATONIN PO, Take by mouth as needed (for sleep)., Disp: , Rfl:  .  nebivolol (BYSTOLIC) 5 MG tablet, Take 5 mg by mouth as needed., Disp: , Rfl:  .  omeprazole (PRILOSEC) 20 MG capsule, TK ONE C PO  30 MINUTES PRIOR TO BREAKFAST, Disp: , Rfl: 1  EXAM:  GENERAL: alert,  oriented, appears well and in no acute distress  HEENT: atraumatic, conjunttiva clear, no obvious abnormalities on inspection of external nose and ears  NECK: normal movements of the head and neck  LUNGS: on inspection no signs of respiratory distress, breathing rate appears normal, no obvious gross SOB, gasping or wheezing  CV: no obvious cyanosis  PSYCH/NEURO: pleasant and cooperative, no obvious depression or anxiety, speech and thought processing grossly intact  ASSESSMENT AND PLAN:  Discussed the following assessment and plan:  Rectal  bleeding Intermittent rectal bleeding as outlined.  No pain. No significant constipation or straining.  BRB.  No bleeding now.  Will check cbc.  Given persistent intermittent episodes, will refer to GI for further evaluation.  Pt in agreement.     Orders Placed This Encounter  Procedures  . CBC with Differential/Platelet    Hold tube for iron studies    Standing Status:   Future    Number of Occurrences:   1    Standing Expiration Date:   02/07/2021  . Ambulatory referral to Gastroenterology    Referral Priority:   Routine    Referral Type:   Consultation    Referral Reason:   Specialty Services Required    Number of Visits Requested:   1      I discussed the assessment and treatment plan with the patient. The patient was provided an opportunity to ask questions and all were answered. The patient agreed with the plan and demonstrated an understanding of the instructions.   The patient was advised to call back or seek an in-person evaluation if the symptoms worsen or if the condition fails to improve as anticipated.   Dale [place], MD

## 2020-02-09 LAB — CBC WITH DIFFERENTIAL/PLATELET
Basophils Absolute: 0.1 10*3/uL (ref 0.0–0.2)
Basos: 1 %
EOS (ABSOLUTE): 0.2 10*3/uL (ref 0.0–0.4)
Eos: 2 %
Hematocrit: 43.7 % (ref 34.0–46.6)
Hemoglobin: 15.4 g/dL (ref 11.1–15.9)
Immature Grans (Abs): 0 10*3/uL (ref 0.0–0.1)
Immature Granulocytes: 0 %
Lymphocytes Absolute: 3.7 10*3/uL — ABNORMAL HIGH (ref 0.7–3.1)
Lymphs: 43 %
MCH: 33.1 pg — ABNORMAL HIGH (ref 26.6–33.0)
MCHC: 35.2 g/dL (ref 31.5–35.7)
MCV: 94 fL (ref 79–97)
Monocytes Absolute: 0.7 10*3/uL (ref 0.1–0.9)
Monocytes: 8 %
Neutrophils Absolute: 3.9 10*3/uL (ref 1.4–7.0)
Neutrophils: 46 %
Platelets: 319 10*3/uL (ref 150–450)
RBC: 4.65 x10E6/uL (ref 3.77–5.28)
RDW: 12 % (ref 11.7–15.4)
WBC: 8.5 10*3/uL (ref 3.4–10.8)

## 2020-02-10 ENCOUNTER — Encounter: Payer: Self-pay | Admitting: Internal Medicine

## 2020-02-14 ENCOUNTER — Encounter: Payer: Self-pay | Admitting: Internal Medicine

## 2020-02-14 NOTE — Assessment & Plan Note (Signed)
Intermittent rectal bleeding as outlined.  No pain. No significant constipation or straining.  BRB.  No bleeding now.  Will check cbc.  Given persistent intermittent episodes, will refer to GI for further evaluation.  Pt in agreement.

## 2020-03-10 ENCOUNTER — Encounter: Payer: Self-pay | Admitting: Internal Medicine

## 2020-04-30 LAB — HM PAP SMEAR: HM Pap smear: NEGATIVE

## 2020-11-10 ENCOUNTER — Ambulatory Visit: Admit: 2020-11-10 | Discharge status: Absent without leave | Payer: Commercial Managed Care - PPO

## 2020-11-13 ENCOUNTER — Telehealth: Payer: Self-pay | Admitting: Internal Medicine

## 2020-11-13 NOTE — Telephone Encounter (Signed)
[facility] hospital call to set up hospital follow up   for patient on the schedule 12-06 2021 at 2:30

## 2020-11-13 NOTE — Telephone Encounter (Signed)
Noted. Will follow.  

## 2020-11-14 ENCOUNTER — Telehealth: Payer: Self-pay

## 2020-11-14 NOTE — Telephone Encounter (Signed)
Transition Care Management Follow-up Telephone Call  Date of discharge and from where: 11/13/20 from [facility]  How have you been since you were released from the hospital? Denies dyspnea, headache, extreme fatigue, visual disturbances, dizziness/light-headedness weight gain, chest pain. No new or worsening symptoms. She has not tried to lay flat yet, elevated when resting. No distress.   Any questions or concerns? No  Items Reviewed: Did the pt receive and understand the discharge instructions provided? Yes , increase activity as tolerated. Pace self. Weigh daily same time. If you notice increased shortness of breath, leg swelling, weight gain of >/= 2 lbs in 1 day or 5 lbs in 1 week, take a dose of lasix.    Medications obtained and verified? Notes no difficulty understanding how to take new medications. Start enalapril 5mg  BID, lovenox 0.66mL q 12hr, lasix 20mg  prn (swelling, shortness of breath, weight gain of >/= 2 lbs in 1 day or 5 lbs in 1 week), metoprolol succinate 50 MG 24 hr tablet daily. Continue all other medications (Tylenol, docusate, melatonin).    Any new allergies since your discharge? No   Dietary orders reviewed? Yes low sodium/heart healthy.  Do you have support at home? No   Home Care and Equipment/Supplies: Were home health services ordered? No  Were any new equipment or medical supplies ordered?  No  Functional Questionnaire: (I = Independent and D = Dependent) ADLs: i  Bathing/Dressing- i  Meal Prep- i  Eating- i  Maintaining continence- i  Transferring/Ambulation- i  Managing Meds- i  Follow up appointments reviewed:   PCP Hospital f/u appt confirmed? Yes  Scheduled to see Dr. 11m on 11/26/20 @ 2:30. Thyroid Lorin Picket recommended. Thyroid nodule seen on CTA chest completed in ED.   Specialist Hospital f/u appt confirmed? Yes  Scheduled to see [facility] on 12/06/20.  Awaiting call back to schedule OB follow up.  Are transportation arrangements needed?  No   If their condition worsens, is the pt aware to call PCP or go to the Emergency Dept.? Yes  Was the patient provided with contact information for the PCP's office or ED? Yes, [phone] [facility]CP [facility] or [phone] [facility] on call.  Was to pt encouraged to call back with questions or concerns? Yes

## 2020-11-22 ENCOUNTER — Telehealth: Payer: Commercial Managed Care - PPO | Admitting: Family

## 2020-11-22 DIAGNOSIS — N390 Urinary tract infection, site not specified: Secondary | ICD-10-CM

## 2020-11-22 MED ORDER — CEPHALEXIN 500 MG PO CAPS: 500.0000 mg | ORAL_CAPSULE | Freq: Two times a day (BID) | ORAL | 0 refills | Status: DC

## 2020-11-22 NOTE — Progress Notes (Signed)
We are sorry that you are not feeling well.  Here is how we plan to help!  Based on what you shared with me it looks like you most likely have a simple urinary tract infection.  A UTI (Urinary Tract Infection) is a bacterial infection of the bladder.  Most cases of urinary tract infections are simple to treat but a key part of your care is to encourage you to drink plenty of fluids and watch your symptoms carefully.  I have prescribed Keflex 500 mg twice a day for 7 days.  Your symptoms should gradually improve. Call us if the burning in your urine worsens, you develop worsening fever, back pain or pelvic pain or if your symptoms do not resolve after completing the antibiotic.  I can see that you have been through a lot in the past 2 weeks. Congratulations on your newborn and I hope you start feeling better soon. The antibiotic is safe while breastfeeding. Please consider being seen in person if your symptoms persist.   Urinary tract infections can be prevented by drinking plenty of water to keep your body hydrated.  Also be sure when you wipe, wipe from front to back and don't hold it in!  If possible, empty your bladder every 4 hours.  Your e-visit answers were reviewed by a board certified advanced clinical practitioner to complete your personal care plan.  Depending on the condition, your plan could have included both over the counter or prescription medications.  If there is a problem please reply  once you have received a response from your provider.  Your safety is important to [place].  If you have drug allergies check your prescription carefully.    You can use MyChart to ask questions about today's visit, request a non-urgent call back, or ask for a work or school excuse for 24 hours related to this e-Visit. If it has been greater than 24 hours you will need to follow up with your provider, or enter a new e-Visit to address those concerns.   You will get an e-mail in the next two days  asking about your experience.  I hope that your e-visit has been valuable and will speed your recovery. Thank you for using e-visits.  Greater than 5 minutes, yet less than 10 minutes of time have been spent researching, coordinating, and implementing care for this patient.

## 2020-11-26 ENCOUNTER — Ambulatory Visit: Payer: Commercial Managed Care - PPO | Admitting: Internal Medicine

## 2020-11-26 ENCOUNTER — Other Ambulatory Visit: Payer: Self-pay

## 2020-11-26 VITALS — BP 108/78 | HR 48 | Temp 98.3°F | Ht 67.01 in | Wt 218.0 lb

## 2020-11-26 DIAGNOSIS — N3000 Acute cystitis without hematuria: Secondary | ICD-10-CM

## 2020-11-26 DIAGNOSIS — R002 Palpitations: Secondary | ICD-10-CM | POA: Diagnosis not present

## 2020-11-26 DIAGNOSIS — I493 Ventricular premature depolarization: Secondary | ICD-10-CM | POA: Diagnosis not present

## 2020-11-26 DIAGNOSIS — E041 Nontoxic single thyroid nodule: Secondary | ICD-10-CM

## 2020-11-26 DIAGNOSIS — D649 Anemia, unspecified: Secondary | ICD-10-CM | POA: Diagnosis not present

## 2020-11-26 DIAGNOSIS — I2699 Other pulmonary embolism without acute cor pulmonale: Secondary | ICD-10-CM

## 2020-11-26 DIAGNOSIS — O903 Peripartum cardiomyopathy: Secondary | ICD-10-CM

## 2020-11-26 NOTE — Progress Notes (Signed)
Patient ID: Michele [name], female   DOB: 04-17-1991, 29 y.o.   MRN: [id number]   Subjective:    Patient ID: Michele [name], female    DOB: [birth year], 29 y.o.   MRN: [id number]  HPI This visit occurred during the SARS-CoV-2 public health emergency.  Safety protocols were in place, including screening questions prior to the visit, additional usage of staff PPE, and extensive cleaning of exam room while observing appropriate contact time as indicated for disinfecting solutions.  Patient here for a hospital follow up.  Admitted 11/10/20 - 11/13/20.  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 CTA chest 11/10/20 - right middle lobe subsegmental PE.  Started on lovenox - given breast feeding.  Also diagnosed with postpartum cardiomyopathy.  Had been experiencing orthopnea for the last 3 months of pregnancy that worsened the week before her presentation to ER.  Had noticed wheezing and dyspnea - lying flat.  TTE 11/11/20 - EF 35-40% with normal RV systolic function.  Cardiology consulted and she was started on enalapril, metoprolol and lasix prn. Incidentally found to have thyroid nodule 1cm.  Since her discharge, she has done relatively well.  Breathing is stable.  No chest pain.  No increased cough or congestion reported.  No abdominal pain.  Previous diarrhea.  Had been taking coloce. Was recently evaluated and diagnosed with UTI.  On abx.  Eating yogurt.  Has noticed some palpitations.  Has a history of PVCs.  Feels a little different, but no chest pain or syncope associated.  Overall feels things are relatively stable.      Past Medical History:  Diagnosis Date  . History of chicken pox   . Hx of migraines   . Hx: UTI (urinary tract infection)   . Hyperlipidemia   . Palpitations    Past Surgical History:  Procedure Laterality Date  . TONSILLECTOMY AND ADENOIDECTOMY  1997   Family History  Problem Relation Age of Onset  . Hyperlipidemia Father   .  Hypertension Father   . Hypertension Mother   . Breast cancer Maternal Grandmother   . Prostate cancer Maternal Grandfather   . Heart disease Maternal Grandfather   . Hypertension Maternal Grandfather   . Diabetes Maternal Grandfather   . Breast cancer Paternal Grandmother   . Hyperlipidemia Paternal Grandmother   . Hypertension Paternal Grandmother   . Diabetes Paternal Grandmother   . Mental illness Paternal Grandmother   . Hyperlipidemia Paternal Grandfather   . Stroke Paternal Grandfather   . Hypertension Paternal Grandfather    Social History   Socioeconomic History  . Marital status: Married    Spouse name: Not on file  . Number of children: 0  . Years of education: Not on file  . Highest education level: Not on file  Occupational History  . Not on file  Tobacco Use  . Smoking status: Never Smoker  . Smokeless tobacco: Never Used  Vaping Use  . Vaping Use: Never used  Substance and Sexual Activity  . Alcohol use: Yes    Alcohol/week: 0.0 standard drinks  . Drug use: No  . Sexual activity: Yes  Other Topics Concern  . Not on file  Social History Narrative  . Not on file   Social Determinants of Health   Financial Resource Strain:   . Difficulty of Paying Living Expenses: Not on file  Food Insecurity:   . Worried About Programme researcher, broadcasting/film/video in the Last Year: Not on  file  . Ran Out of Food in the Last Year: Not on file  Transportation Needs:   . Lack of Transportation (Medical): Not on file  . Lack of Transportation (Non-Medical): Not on file  Physical Activity:   . Days of Exercise per Week: Not on file  . Minutes of Exercise per Session: Not on file  Stress:   . Feeling of Stress : Not on file  Social Connections:   . Frequency of Communication with Friends and Family: Not on file  . Frequency of Social Gatherings with Friends and Family: Not on file  . Attends Religious Services: Not on file  . Active Member of Clubs or Organizations: Not on file  .  Attends Banker Meetings: Not on file  . Marital Status: Not on file    Outpatient Encounter Medications as of 11/26/2020  Medication Sig  . acetaminophen (TYLENOL) 500 MG tablet Take by mouth.  Marland Kitchen aluminum chloride (DRYSOL) 20 % external solution Apply topically at bedtime.  . cephALEXin (KEFLEX) 500 MG capsule Take 1 capsule (500 mg total) by mouth 2 (two) times daily.  . enalapril (VASOTEC) 5 MG tablet enalapril maleate 5 mg tablet  Take 1 tablet every day by oral route.  . enoxaparin (LOVENOX) 120 MG/0.8ML injection Inject into the skin.  . famotidine (PEPCID) 20 MG tablet Take 1 tablet (20 mg total) by mouth 2 (two) times daily.  . furosemide (LASIX) 20 MG tablet Take by mouth.  Marland Kitchen ketoconazole (NIZORAL) 2 % shampoo   . Magnesium 500 MG TABS Take 1,000 mg by mouth daily.  Marland Kitchen MELATONIN PO Take by mouth as needed (for sleep).  . metoprolol succinate (TOPROL-XL) 50 MG 24 hr tablet Take by mouth.  . nebivolol (BYSTOLIC) 5 MG tablet Take 5 mg by mouth as needed.  Marland Kitchen omeprazole (PRILOSEC) 20 MG capsule TK ONE C PO  30 MINUTES PRIOR TO BREAKFAST   No facility-administered encounter medications on file as of 11/26/2020.    Review of Systems  Constitutional: Positive for fatigue. Negative for appetite change and unexpected weight change.  HENT: Negative for congestion and sinus pressure.   Respiratory: Negative for cough and chest tightness.        Breathing stable.   Cardiovascular: Positive for palpitations. Negative for chest pain and leg swelling.  Gastrointestinal: Negative for abdominal pain, diarrhea, nausea and vomiting.  Genitourinary: Negative for difficulty urinating and dysuria.  Musculoskeletal: Negative for joint swelling and myalgias.  Skin: Negative for color change and rash.  Neurological: Negative for dizziness, light-headedness and headaches.  Psychiatric/Behavioral: Negative for agitation and dysphoric mood.       Objective:    Physical Exam Vitals  reviewed.  Constitutional:      General: She is not in acute distress.    Appearance: Normal appearance.  HENT:     Head: Normocephalic and atraumatic.     Right Ear: External ear normal.     Left Ear: External ear normal.  Eyes:     General: No scleral icterus.       Right eye: No discharge.        Left eye: No discharge.     Conjunctiva/sclera: Conjunctivae normal.  Neck:     Thyroid: No thyromegaly.  Cardiovascular:     Rate and Rhythm: Normal rate and regular rhythm.  Pulmonary:     Effort: No respiratory distress.     Breath sounds: Normal breath sounds. No wheezing.  Abdominal:     General: Bowel  sounds are normal.     Palpations: Abdomen is soft.     Tenderness: There is no abdominal tenderness.  Musculoskeletal:        General: No swelling or tenderness.     Cervical back: Neck supple. No tenderness.  Lymphadenopathy:     Cervical: No cervical adenopathy.  Skin:    Findings: No erythema or rash.  Neurological:     Mental Status: She is alert.  Psychiatric:        Mood and Affect: Mood normal.        Behavior: Behavior normal.     BP 108/78 (BP Location: Left Arm, Patient Position: Sitting)   Pulse (!) 48   Temp 98.3 F (36.8 C)   Ht 5' 7.01" (1.702 m)   Wt 218 lb (98.9 kg)   SpO2 98%   BMI 34.14 kg/m  Wt Readings from Last 3 Encounters:  11/26/20 218 lb (98.9 kg)  02/08/20 220 lb (99.8 kg)  12/13/19 220 lb (99.8 kg)     Lab Results  Component Value Date   WBC 8.5 02/08/2020   HGB 15.4 02/08/2020   HCT 43.7 02/08/2020   PLT 319 02/08/2020   GLUCOSE 78 07/21/2019   CHOL 176 10/12/2018   TRIG 88.0 10/12/2018   HDL 50.00 10/12/2018   LDLCALC 108 (H) 10/12/2018   ALT 14 10/12/2018   AST 12 10/12/2018   NA 138 07/21/2019   K 4.2 07/21/2019   CL 105 07/21/2019   CREATININE 0.80 07/21/2019   BUN 8 07/21/2019   CO2 23 07/21/2019   TSH 1.95 07/21/2019   HGBA1C 5.1 07/21/2019       Assessment & Plan:   Problem List Items Addressed This  Visit    UTI (urinary tract infection)    Currently on abx for UTI.  Follow.       Thyroid nodule    Found on recent CT.  Check thyroid ultrasound and tsh.       Relevant Medications   metoprolol succinate (TOPROL-XL) 50 MG 24 hr tablet   Other Relevant Orders   US THYROID   Pulmonary embolism (HCC)    Recently admitted and diagnosed with PE.  On lovenox. Per note, to continue for three months.  Follow.       Relevant Medications   enalapril (VASOTEC) 5 MG tablet   enoxaparin (LOVENOX) 120 MG/0.8ML injection   furosemide (LASIX) 20 MG tablet   metoprolol succinate (TOPROL-XL) 50 MG 24 hr tablet   Postpartum cardiomyopathy    EF 35-40%.  Evaluated by cardiology.  Continue enalapril, metoprolol and prn lasix.  Breathing stable.  Follow.  Keep f/u with cardiology next week.        Relevant Medications   enalapril (VASOTEC) 5 MG tablet   enoxaparin (LOVENOX) 120 MG/0.8ML injection   furosemide (LASIX) 20 MG tablet   metoprolol succinate (TOPROL-XL) 50 MG 24 hr tablet   Palpitations - Primary    Symptoms as outlined.  EKG as outlined - bigeminy, non specific changes.  F/u with cardiology next week.  Continue metoprolol.  Check cbc and electrolytes.        Relevant Orders   EKG 12-Lead (Completed)   TSH   Basic metabolic panel   Frequent PVCs    Has a history of frequent PVCs.  EKG - bigeminy noted, non specific changes noted.  Has an appt with cardiology next week.  Keep appt.  Follow symptoms.        Relevant Medications  enalapril (VASOTEC) 5 MG tablet   enoxaparin (LOVENOX) 120 MG/0.8ML injection   furosemide (LASIX) 20 MG tablet   metoprolol succinate (TOPROL-XL) 50 MG 24 hr tablet   Anemia    Recent hgb 10.  Recheck cbc.       Relevant Orders   CBC with Differential/Platelet       Dale Alvan, MD

## 2020-11-27 ENCOUNTER — Encounter: Payer: Self-pay | Admitting: Internal Medicine

## 2020-11-27 DIAGNOSIS — O903 Peripartum cardiomyopathy: Secondary | ICD-10-CM | POA: Insufficient documentation

## 2020-11-27 DIAGNOSIS — I2699 Other pulmonary embolism without acute cor pulmonale: Secondary | ICD-10-CM | POA: Insufficient documentation

## 2020-11-27 DIAGNOSIS — E041 Nontoxic single thyroid nodule: Secondary | ICD-10-CM | POA: Insufficient documentation

## 2020-11-27 LAB — CBC WITH DIFFERENTIAL/PLATELET
Basophils Absolute: 0.1 10*3/uL (ref 0.0–0.1)
Basophils Relative: 1.3 % (ref 0.0–3.0)
Eosinophils Absolute: 0.3 10*3/uL (ref 0.0–0.7)
Eosinophils Relative: 5.3 % — ABNORMAL HIGH (ref 0.0–5.0)
HCT: 37.7 % (ref 36.0–46.0)
Hemoglobin: 12.3 g/dL (ref 12.0–15.0)
Lymphocytes Relative: 43.3 % (ref 12.0–46.0)
Lymphs Abs: 2.8 10*3/uL (ref 0.7–4.0)
MCHC: 32.7 g/dL (ref 30.0–36.0)
MCV: 91.1 fl (ref 78.0–100.0)
Monocytes Absolute: 0.5 10*3/uL (ref 0.1–1.0)
Monocytes Relative: 8 % (ref 3.0–12.0)
Neutro Abs: 2.7 10*3/uL (ref 1.4–7.7)
Neutrophils Relative %: 42.1 % — ABNORMAL LOW (ref 43.0–77.0)
Platelets: 404 10*3/uL — ABNORMAL HIGH (ref 150.0–400.0)
RBC: 4.14 Mil/uL (ref 3.87–5.11)
RDW: 15.3 % (ref 11.5–15.5)
WBC: 6.4 10*3/uL (ref 4.0–10.5)

## 2020-11-27 LAB — BASIC METABOLIC PANEL
BUN: 10 mg/dL (ref 6–23)
CO2: 27 mEq/L (ref 19–32)
Calcium: 9.5 mg/dL (ref 8.4–10.5)
Chloride: 102 mEq/L (ref 96–112)
Creatinine, Ser: 0.8 mg/dL (ref 0.40–1.20)
GFR: 98.58 mL/min (ref >60.00)
Glucose, Bld: 75 mg/dL (ref 70–99)
Potassium: 5.1 mEq/L (ref 3.5–5.1)
Sodium: 138 mEq/L (ref 135–145)

## 2020-11-27 LAB — TSH: TSH: 0.72 u[IU]/mL (ref 0.35–4.50)

## 2020-11-27 NOTE — Assessment & Plan Note (Signed)
Currently on abx for UTI.  Follow.

## 2020-11-27 NOTE — Assessment & Plan Note (Signed)
Recent hgb 10.  Recheck cbc.

## 2020-11-27 NOTE — Assessment & Plan Note (Signed)
EF 35-40%.  Evaluated by cardiology.  Continue enalapril, metoprolol and prn lasix.  Breathing stable.  Follow.  Keep f/u with cardiology next week.

## 2020-11-27 NOTE — Assessment & Plan Note (Signed)
Found on recent CT.  Check thyroid ultrasound and tsh.

## 2020-11-27 NOTE — Assessment & Plan Note (Addendum)
Symptoms as outlined.  EKG as outlined - bigeminy, non specific changes.  F/u with cardiology next week.  Continue metoprolol.  Check cbc and electrolytes.

## 2020-11-27 NOTE — Assessment & Plan Note (Signed)
Recently admitted and diagnosed with PE.  On lovenox. Per note, to continue for three months.  Follow.

## 2020-11-27 NOTE — Assessment & Plan Note (Signed)
Has a history of frequent PVCs.  EKG - bigeminy noted, non specific changes noted.  Has an appt with cardiology next week.  Keep appt.  Follow symptoms.

## 2020-11-28 ENCOUNTER — Other Ambulatory Visit: Payer: Self-pay | Admitting: Internal Medicine

## 2020-11-28 DIAGNOSIS — E875 Hyperkalemia: Secondary | ICD-10-CM

## 2020-11-28 DIAGNOSIS — I82409 Acute embolism and thrombosis of unspecified deep veins of unspecified lower extremity: Secondary | ICD-10-CM

## 2020-11-28 DIAGNOSIS — I2699 Other pulmonary embolism without acute cor pulmonale: Secondary | ICD-10-CM

## 2020-11-28 NOTE — Progress Notes (Signed)
Order placed for hematology referral.  

## 2020-11-28 NOTE — Progress Notes (Signed)
Order placed for f/u potassium.  

## 2020-12-04 ENCOUNTER — Inpatient Hospital Stay: Payer: Commercial Managed Care - PPO | Attending: Internal Medicine | Admitting: Internal Medicine

## 2020-12-04 ENCOUNTER — Encounter: Payer: Self-pay | Admitting: Internal Medicine

## 2020-12-04 ENCOUNTER — Other Ambulatory Visit: Payer: Self-pay

## 2020-12-04 ENCOUNTER — Inpatient Hospital Stay: Payer: Commercial Managed Care - PPO

## 2020-12-04 DIAGNOSIS — Z823 Family history of stroke: Secondary | ICD-10-CM

## 2020-12-04 DIAGNOSIS — Z803 Family history of malignant neoplasm of breast: Secondary | ICD-10-CM | POA: Diagnosis not present

## 2020-12-04 DIAGNOSIS — I2693 Single subsegmental pulmonary embolism without acute cor pulmonale: Secondary | ICD-10-CM | POA: Diagnosis not present

## 2020-12-04 DIAGNOSIS — E041 Nontoxic single thyroid nodule: Secondary | ICD-10-CM | POA: Insufficient documentation

## 2020-12-04 DIAGNOSIS — Z833 Family history of diabetes mellitus: Secondary | ICD-10-CM | POA: Diagnosis not present

## 2020-12-04 DIAGNOSIS — Z79899 Other long term (current) drug therapy: Secondary | ICD-10-CM

## 2020-12-04 DIAGNOSIS — I428 Other cardiomyopathies: Secondary | ICD-10-CM | POA: Diagnosis not present

## 2020-12-04 DIAGNOSIS — Z8042 Family history of malignant neoplasm of prostate: Secondary | ICD-10-CM | POA: Insufficient documentation

## 2020-12-04 DIAGNOSIS — I82462 Acute embolism and thrombosis of left calf muscular vein: Secondary | ICD-10-CM | POA: Diagnosis present

## 2020-12-04 DIAGNOSIS — R5383 Other fatigue: Secondary | ICD-10-CM | POA: Diagnosis not present

## 2020-12-04 DIAGNOSIS — Z87440 Personal history of urinary (tract) infections: Secondary | ICD-10-CM | POA: Diagnosis not present

## 2020-12-04 DIAGNOSIS — Z7901 Long term (current) use of anticoagulants: Secondary | ICD-10-CM | POA: Diagnosis not present

## 2020-12-04 DIAGNOSIS — Z8249 Family history of ischemic heart disease and other diseases of the circulatory system: Secondary | ICD-10-CM | POA: Diagnosis not present

## 2020-12-04 DIAGNOSIS — Z8349 Family history of other endocrine, nutritional and metabolic diseases: Secondary | ICD-10-CM | POA: Diagnosis not present

## 2020-12-04 DIAGNOSIS — Z818 Family history of other mental and behavioral disorders: Secondary | ICD-10-CM | POA: Diagnosis not present

## 2020-12-04 DIAGNOSIS — I2699 Other pulmonary embolism without acute cor pulmonale: Secondary | ICD-10-CM

## 2020-12-04 MED ORDER — ENOXAPARIN SODIUM 100 MG/ML ~~LOC~~ SOLN: 100.0000 mg | Freq: Two times a day (BID) | SUBCUTANEOUS | 2 refills | Status: DC

## 2020-12-04 NOTE — Progress Notes (Signed)
[facility] CONSULT NOTE  Patient Care Team: Dale Meeker, MD as PCP - General (Internal Medicine)  CHIEF COMPLAINTS/PURPOSE OF CONSULTATION: DVT/PE  # NOV 22nd 2021- [POST PARTUM] [facility]-Final Interpretation  Right  There is no evidence of DVT in the lower extremity.  Left  There is evidence of acute DVT in the intramuscular calf veins.CT A 11/20/2021Ffilling defect in right middle lobe subsegmental pulmonary artery and concerning for small pulmonary emboli.   # thyroid nodule- needs work up.     Oncology History   No history exists.   HISTORY OF PRESENTING ILLNESS:  Michele [name] 29 y.o.  female with a new diagnosis of DVT/PE is here for new consultation.  Patient states that approximately 3 weeks ago [2 weeks postpartum postpartum]-patient noted to have worsening shortness of breath/orthopnea.  Also complained of left lower extremity cramping.  This led to evaluation the emergency room at [facility] showed right middle lobe subsegmental pulmonary embolus.  Patient has been on Lovenox twice a day.  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  Patient was evaluated by cardiology started on ACE inhibitor/beta-blocker.  With regards risk factors: post partUm Long distance travel-none Immobilization/trauma: none Previous history of DVT/PE: none  Family history: none Birth control pills: never    Review of Systems  Constitutional: Positive for malaise/fatigue. Negative for chills, diaphoresis, fever and weight loss.  HENT: Negative for nosebleeds and sore throat.   Eyes: Negative for double vision.  Respiratory: Negative for cough, hemoptysis, sputum production, shortness of breath and wheezing.   Cardiovascular: Negative for chest pain, palpitations, orthopnea and leg swelling.  Gastrointestinal: Negative for abdominal pain, blood in stool, constipation, diarrhea, heartburn,  melena, nausea and vomiting.  Genitourinary: Negative for dysuria, frequency and urgency.  Musculoskeletal: Negative for back pain and joint pain.  Skin: Negative.  Negative for itching and rash.  Neurological: Negative for dizziness, tingling, focal weakness, weakness and headaches.  Endo/Heme/Allergies: Does not bruise/bleed easily.  Psychiatric/Behavioral: Negative for depression. The patient is not nervous/anxious and does not have insomnia.      MEDICAL HISTORY:  Past Medical History:  Diagnosis Date  . History of chicken pox   . Hx of migraines   . Hx: UTI (urinary tract infection)   . Hyperlipidemia   . Palpitations     SURGICAL HISTORY: Past Surgical History:  Procedure Laterality Date  . TONSILLECTOMY AND ADENOIDECTOMY  1997    SOCIAL HISTORY: Social History   Socioeconomic History  . Marital status: Married    Spouse name: Not on file  . Number of children: 0  . Years of education: Not on file  . Highest education level: Not on file  Occupational History  . Not on file  Tobacco Use  . Smoking status: Never Smoker  . Smokeless tobacco: Never Used  Vaping Use  . Vaping Use: Never used  Substance and Sexual Activity  . Alcohol use: Yes    Alcohol/week: 0.0 standard drinks  . Drug use: No  . Sexual activity: Yes  Other Topics Concern  . 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    Social Determinants of Health   Financial Resource Strain: Not on file  Food Insecurity: Not on file  Transportation Needs: Not on file  Physical Activity: Not on file  Stress: Not on file  Social Connections: Not  on file  Intimate Partner Violence: Not on file    FAMILY HISTORY: Family History  Problem Relation Age of Onset  . Hyperlipidemia Father   . Hypertension Father   . Hypertension Mother   . Breast cancer Maternal Grandmother   . Prostate cancer Maternal  Grandfather   . Heart disease Maternal Grandfather   . Hypertension Maternal Grandfather   . Diabetes Maternal Grandfather   . Breast cancer Paternal Grandmother   . Hyperlipidemia Paternal Grandmother   . Hypertension Paternal Grandmother   . Diabetes Paternal Grandmother   . Mental illness Paternal Grandmother   . Hyperlipidemia Paternal Grandfather   . Stroke Paternal Grandfather   . Hypertension Paternal Grandfather     ALLERGIES:  has No Known Allergies.  MEDICATIONS:  Current Outpatient Medications  Medication Sig Dispense Refill  . acetaminophen (TYLENOL) 500 MG tablet Take by mouth.    . enalapril (VASOTEC) 5 MG tablet enalapril maleate 5 mg tablet  Take 1 tablet every day by oral route.    . famotidine (PEPCID) 20 MG tablet Take 1 tablet (20 mg total) by mouth 2 (two) times daily. 60 tablet 0  . furosemide (LASIX) 20 MG tablet Take by mouth.    . Magnesium 500 MG TABS Take 1,000 mg by mouth daily.    Marland Kitchen MELATONIN PO Take by mouth as needed (for sleep).    . metoprolol succinate (TOPROL-XL) 50 MG 24 hr tablet Take by mouth.    Marland Kitchen omeprazole (PRILOSEC) 20 MG capsule TK ONE C PO  30 MINUTES PRIOR TO BREAKFAST  1  . enoxaparin (LOVENOX) 100 MG/ML injection Inject 1 mL (100 mg total) into the skin every 12 (twelve) hours. 60 mL 2   No current facility-administered medications for this visit.      Marland Kitchen  PHYSICAL EXAMINATION:  Vitals:   12/04/20 1128  BP: 114/74  Pulse: 81  Resp: 16  Temp: 99.2 F (37.3 C)  SpO2: 100%   Filed Weights   12/04/20 1128  Weight: 216 lb (98 kg)    Physical Exam HENT:     Head: Normocephalic and atraumatic.     Mouth/Throat:     Mouth: Oropharynx is clear and moist.     Pharynx: No oropharyngeal exudate.  Eyes:     Pupils: Pupils are equal, round, and reactive to light.  Cardiovascular:     Rate and Rhythm: Normal rate and regular rhythm.  Pulmonary:     Effort: No respiratory distress.     Breath sounds: No wheezing.   Abdominal:     General: Bowel sounds are normal. There is no distension.     Palpations: Abdomen is soft. There is no mass.     Tenderness: There is no abdominal tenderness. There is no guarding or rebound.  Musculoskeletal:        General: No tenderness or edema. Normal range of motion.     Cervical back: Normal range of motion and neck supple.  Skin:    General: Skin is warm.  Neurological:     Mental Status: She is alert and oriented to person, place, and time.  Psychiatric:        Mood and Affect: Affect normal.      LABORATORY DATA:  I have reviewed the data as listed Lab Results  Component Value Date   WBC 6.4 11/26/2020   HGB 12.3 11/26/2020   HCT 37.7 11/26/2020   MCV 91.1 11/26/2020   PLT 404.0 (H) 11/26/2020   Recent  Labs    11/26/20 1607  NA 138  K 5.1  CL 102  CO2 27  GLUCOSE 75  BUN 10  CREATININE 0.80  CALCIUM 9.5    RADIOGRAPHIC STUDIES: I have personally reviewed the radiological images as listed and agreed with the findings in the report. No results found.  ASSESSMENT & PLAN:   Pulmonary embolus, right (HCC) #Provoked/postpartum right middle lobe subsegmental PE-currently on Lovenox twice a day.  I renewed the prescription for Lovenox 1 mg/kg twice daily for 3 months.  Discussed that in general anticoagulation is recommended 3 to 6 months. [Also await cardiac evaluation].  I discussed option of switching over to Coumadin; which is safe in breast-feeding.  However discussed the concerns of therapeutic monitoring/dose changes diet restrictions etc.  #Cardiomyopathy-postpartum clinically stable; on ACE inhibitor beta-blocker.  Appointment with cardiology next week  Thank you Dr.Scott for allowing me to participate in the care of your pleasant patient. Please do not hesitate to contact me with questions or concerns in the interim.  # DISPOSITION: # follow up in 3 months; MD; labs- cbc/cmp-Dr.B  All questions were answered. The patient knows to  call the clinic with any problems, questions or concerns.     Earna Coder, MD 12/04/2020 1:59 PM

## 2020-12-04 NOTE — Assessment & Plan Note (Addendum)
#  Provoked/postpartum right middle lobe subsegmental PE-currently on Lovenox twice a day.  I renewed the prescription for Lovenox 1 mg/kg twice daily for 3 months.  Discussed that in general anticoagulation is recommended 3 to 6 months. [Also await cardiac evaluation].  I discussed option of switching over to Coumadin; which is safe in breast-feeding.  However discussed the concerns of therapeutic monitoring/dose changes diet restrictions etc.  #Cardiomyopathy-postpartum clinically stable; on ACE inhibitor beta-blocker.  Appointment with cardiology next week  Thank you Dr.Scott for allowing me to participate in the care of your pleasant patient. Please do not hesitate to contact me with questions or concerns in the interim.  # DISPOSITION: # follow up in 3 months; MD; labs- cbc/cmp-Dr.B

## 2020-12-05 ENCOUNTER — Telehealth: Payer: Self-pay | Admitting: Internal Medicine

## 2020-12-05 ENCOUNTER — Other Ambulatory Visit: Payer: Self-pay | Admitting: Internal Medicine

## 2020-12-05 ENCOUNTER — Ambulatory Visit
Admission: RE | Admit: 2020-12-05 | Discharge: 2020-12-05 | Disposition: A | Discharge status: Home / Self Care | Payer: Commercial Managed Care - PPO | Source: Ambulatory Visit | Attending: Internal Medicine | Admitting: Internal Medicine

## 2020-12-05 ENCOUNTER — Other Ambulatory Visit: Payer: Self-pay

## 2020-12-05 ENCOUNTER — Other Ambulatory Visit (INDEPENDENT_AMBULATORY_CARE_PROVIDER_SITE_OTHER): Payer: Commercial Managed Care - PPO

## 2020-12-05 DIAGNOSIS — R944 Abnormal results of kidney function studies: Secondary | ICD-10-CM

## 2020-12-05 DIAGNOSIS — E041 Nontoxic single thyroid nodule: Secondary | ICD-10-CM | POA: Insufficient documentation

## 2020-12-05 DIAGNOSIS — R300 Dysuria: Secondary | ICD-10-CM

## 2020-12-05 LAB — URINALYSIS, ROUTINE W REFLEX MICROSCOPIC
Bilirubin Urine: NEGATIVE
Ketones, ur: NEGATIVE
Nitrite: NEGATIVE
Specific Gravity, Urine: 1.02 (ref 1.000–1.030)
Total Protein, Urine: NEGATIVE
Urine Glucose: NEGATIVE
Urobilinogen, UA: 0.2 (ref 0.0–1.0)
pH: 6.5 (ref 5.0–8.0)

## 2020-12-05 NOTE — Telephone Encounter (Signed)
Patient is coming in to leave urine sample this PM

## 2020-12-05 NOTE — Telephone Encounter (Signed)
Patient is complaining of burning with urination, frequency and urgency. Saw you last week. Wanted to know if she could come in today and leave a urine sample to send off for a culture.

## 2020-12-05 NOTE — Progress Notes (Signed)
Order placed for f/u met b 

## 2020-12-05 NOTE — Telephone Encounter (Signed)
Pt called she was seen last week and still is having UTI symptoms and wanted to know if she could come do another urine

## 2020-12-05 NOTE — Telephone Encounter (Signed)
Ok

## 2020-12-05 NOTE — Addendum Note (Signed)
Addended by: Larry Sierras on: 12/05/2020 01:10 PM   Modules accepted: Orders

## 2020-12-06 ENCOUNTER — Other Ambulatory Visit: Payer: Self-pay

## 2020-12-06 LAB — URINE CULTURE
MICRO NUMBER:: 11321413
SPECIMEN QUALITY:: ADEQUATE

## 2020-12-06 MED ORDER — CEPHALEXIN 500 MG PO CAPS: 500.0000 mg | ORAL_CAPSULE | Freq: Two times a day (BID) | ORAL | 0 refills | Status: DC

## 2020-12-08 ENCOUNTER — Other Ambulatory Visit: Payer: Self-pay | Admitting: Internal Medicine

## 2020-12-08 DIAGNOSIS — E041 Nontoxic single thyroid nodule: Secondary | ICD-10-CM

## 2020-12-08 NOTE — Progress Notes (Signed)
Order placed for endocrinology referral.  

## 2020-12-11 ENCOUNTER — Other Ambulatory Visit: Payer: Self-pay

## 2020-12-11 ENCOUNTER — Other Ambulatory Visit (INDEPENDENT_AMBULATORY_CARE_PROVIDER_SITE_OTHER): Payer: Commercial Managed Care - PPO

## 2020-12-11 DIAGNOSIS — E875 Hyperkalemia: Secondary | ICD-10-CM

## 2020-12-11 DIAGNOSIS — I2699 Other pulmonary embolism without acute cor pulmonale: Secondary | ICD-10-CM

## 2020-12-11 DIAGNOSIS — R944 Abnormal results of kidney function studies: Secondary | ICD-10-CM

## 2020-12-11 LAB — BASIC METABOLIC PANEL
BUN: 9 mg/dL (ref 6–23)
CO2: 27 mEq/L (ref 19–32)
Calcium: 9.4 mg/dL (ref 8.4–10.5)
Chloride: 105 mEq/L (ref 96–112)
Creatinine, Ser: 0.86 mg/dL (ref 0.40–1.20)
GFR: 90.36 mL/min (ref >60.00)
Glucose, Bld: 83 mg/dL (ref 70–99)
Potassium: 4.9 mEq/L (ref 3.5–5.1)
Sodium: 139 mEq/L (ref 135–145)

## 2020-12-25 ENCOUNTER — Encounter: Payer: Commercial Managed Care - PPO | Admitting: Internal Medicine

## 2020-12-26 ENCOUNTER — Ambulatory Visit
Admission: RE | Admit: 2020-12-26 | Discharge: 2020-12-26 | Disposition: A | Discharge status: Home / Self Care | Payer: Federal, State, Local not specified - PPO | Source: Ambulatory Visit | Attending: Internal Medicine | Admitting: Internal Medicine

## 2020-12-26 ENCOUNTER — Other Ambulatory Visit: Payer: Self-pay

## 2020-12-26 ENCOUNTER — Ambulatory Visit: Payer: Federal, State, Local not specified - PPO | Admitting: Internal Medicine

## 2020-12-26 ENCOUNTER — Encounter: Payer: Self-pay | Admitting: Internal Medicine

## 2020-12-26 VITALS — BP 98/76 | HR 36 | Temp 97.9°F | Resp 15 | Ht 67.0 in | Wt 215.8 lb

## 2020-12-26 DIAGNOSIS — O903 Peripartum cardiomyopathy: Secondary | ICD-10-CM

## 2020-12-26 DIAGNOSIS — R42 Dizziness and giddiness: Secondary | ICD-10-CM

## 2020-12-26 DIAGNOSIS — M79662 Pain in left lower leg: Secondary | ICD-10-CM | POA: Insufficient documentation

## 2020-12-26 DIAGNOSIS — E7800 Pure hypercholesterolemia, unspecified: Secondary | ICD-10-CM

## 2020-12-26 DIAGNOSIS — I499 Cardiac arrhythmia, unspecified: Secondary | ICD-10-CM | POA: Diagnosis not present

## 2020-12-26 DIAGNOSIS — M79605 Pain in left leg: Secondary | ICD-10-CM | POA: Diagnosis not present

## 2020-12-26 DIAGNOSIS — I493 Ventricular premature depolarization: Secondary | ICD-10-CM

## 2020-12-26 DIAGNOSIS — I2699 Other pulmonary embolism without acute cor pulmonale: Secondary | ICD-10-CM

## 2020-12-26 DIAGNOSIS — D649 Anemia, unspecified: Secondary | ICD-10-CM

## 2020-12-26 DIAGNOSIS — E041 Nontoxic single thyroid nodule: Secondary | ICD-10-CM

## 2020-12-26 LAB — CBC WITH DIFFERENTIAL/PLATELET
Basophils Absolute: 0 10*3/uL (ref 0.0–0.1)
Basophils Relative: 0.5 % (ref 0.0–3.0)
Eosinophils Absolute: 0.3 10*3/uL (ref 0.0–0.7)
Eosinophils Relative: 4.9 % (ref 0.0–5.0)
HCT: 39.4 % (ref 36.0–46.0)
Hemoglobin: 13.3 g/dL (ref 12.0–15.0)
Lymphocytes Relative: 53.4 % — ABNORMAL HIGH (ref 12.0–46.0)
Lymphs Abs: 3.5 10*3/uL (ref 0.7–4.0)
MCHC: 33.6 g/dL (ref 30.0–36.0)
MCV: 90.7 fl (ref 78.0–100.0)
Monocytes Absolute: 0.4 10*3/uL (ref 0.1–1.0)
Monocytes Relative: 6.2 % (ref 3.0–12.0)
Neutro Abs: 2.3 10*3/uL (ref 1.4–7.7)
Neutrophils Relative %: 35 % — ABNORMAL LOW (ref 43.0–77.0)
Platelets: 313 10*3/uL (ref 150.0–400.0)
RBC: 4.35 Mil/uL (ref 3.87–5.11)
RDW: 15 % (ref 11.5–15.5)
WBC: 6.6 10*3/uL (ref 4.0–10.5)

## 2020-12-26 LAB — BASIC METABOLIC PANEL
BUN: 10 mg/dL (ref 6–23)
CO2: 27 mEq/L (ref 19–32)
Calcium: 9.3 mg/dL (ref 8.4–10.5)
Chloride: 104 mEq/L (ref 96–112)
Creatinine, Ser: 0.88 mg/dL (ref 0.40–1.20)
GFR: 87.88 mL/min (ref >60.00)
Glucose, Bld: 85 mg/dL (ref 70–99)
Potassium: 4.6 mEq/L (ref 3.5–5.1)
Sodium: 139 mEq/L (ref 135–145)

## 2020-12-26 NOTE — Assessment & Plan Note (Signed)
Found on recent CT.  Recent thyroid ultrasound as outlined.  Has appt planned with endocrinology with question of need for biopsy.

## 2020-12-26 NOTE — Assessment & Plan Note (Addendum)
EKG as outlined.  Further w/up as outlined.  Check cbc and metabolic panel.  Recent TSH wnl.

## 2020-12-26 NOTE — Assessment & Plan Note (Signed)
History of DVT.  On lovenox.  Check ultrasound to confirm no DVT.

## 2020-12-26 NOTE — Progress Notes (Signed)
Patient ID: Michele [name], female   DOB: 07-11-1991, 30 y.o.   MRN: [id number]   Subjective:    Patient ID: Michele [name], female    DOB: 01-19-1991, 30 y.o.   MRN: [id number]  HPI This visit occurred during the SARS-CoV-2 public health emergency.  Safety protocols were in place, including screening questions prior to the visit, additional usage of staff PPE, and extensive cleaning of exam room while observing appropriate contact time as indicated for disinfecting solutions.  Patient here for her physical exam.  Recently admitted 10/2020 and diagnosed with DVT, PE and HFrEF.  On lovenox.  Breast feeding.  Gave birth 7 weeks ago.  Diagnosed with postpartum cardiomyopathy.  TTE 11/11/20 - EF 35-40% with normal RV systolic function.  On enalapril, metoprolol and lasix.  She recently saw cardiology - f/u of her CM and palpitations.  Zio monitor placed.  Has f/u planned with cardiology at the end of this month.  She reports noticing left calf cramping.  Just localized to left calf.  Has a history of DVT.  Feels "in same area".  No redness.  No swelling.  No trauma or new activity other than some light yoga over the last week.  She also reports increased pressure - top of her head.  Notices after she bends her head back and then looks forward.  Some noticed with bending forward - with stretches.  Also some light headedness.  Intermittent.  No increased chest pain.  Breathing overall appears to be stable.  No headache or vision change.  Has a fissure and is scheduled to see uro gyn.  Just had f/u regarding thyroid nodule.     Past Medical History:  Diagnosis Date  . History of chicken pox   . Hx of migraines   . Hx: UTI (urinary tract infection)   . Hyperlipidemia   . Palpitations    Past Surgical History:  Procedure Laterality Date  . TONSILLECTOMY AND ADENOIDECTOMY  1997   Family History  Problem Relation Age of Onset  . Hyperlipidemia Father   . Hypertension Father   . Hypertension  Mother   . Breast cancer Maternal Grandmother   . Prostate cancer Maternal Grandfather   . Heart disease Maternal Grandfather   . Hypertension Maternal Grandfather   . Diabetes Maternal Grandfather   . Breast cancer Paternal Grandmother   . Hyperlipidemia Paternal Grandmother   . Hypertension Paternal Grandmother   . Diabetes Paternal Grandmother   . Mental illness Paternal Grandmother   . Hyperlipidemia Paternal Grandfather   . Stroke Paternal Grandfather   . Hypertension Paternal Grandfather    Social History   Socioeconomic History  . Marital status: Married    Spouse name: Not on file  . Number of children: 0  . Years of education: Not on file  . Highest education level: Not on file  Occupational History  . Not on file  Tobacco Use  . Smoking status: Never Smoker  . Smokeless tobacco: Never Used  Vaping Use  . Vaping Use: Never used  Substance and Sexual Activity  . Alcohol use: Yes    Alcohol/week: 0.0 standard drinks  . Drug use: No  . Sexual activity: Yes  Other Topics Concern  . 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    Social Determinants of Health   Financial Resource Strain: Not on file  Food Insecurity: Not on  file  Transportation Needs: Not on file  Physical Activity: Not on file  Stress: Not on file  Social Connections: Not on file    Outpatient Encounter Medications as of 12/26/2020  Medication Sig  . acetaminophen (TYLENOL) 500 MG tablet Take by mouth.  . enalapril (VASOTEC) 5 MG tablet enalapril maleate 5 mg tablet  Take 1 tablet every day by oral route.  . enoxaparin (LOVENOX) 100 MG/ML injection Inject 1 mL (100 mg total) into the skin every 12 (twelve) hours.  . furosemide (LASIX) 20 MG tablet Take 20 mg by mouth as needed.  . Magnesium 500 MG TABS Take 1,000 mg by mouth daily.  Marland Kitchen MELATONIN PO Take by mouth as needed (for sleep).  .  metoprolol succinate (TOPROL-XL) 50 MG 24 hr tablet Take by mouth.  Marland Kitchen omeprazole (PRILOSEC) 20 MG capsule Take 20 mg by mouth as needed.  . [DISCONTINUED] cephALEXin (KEFLEX) 500 MG capsule Take 1 capsule (500 mg total) by mouth 2 (two) times daily. (Patient not taking: Reported on 12/26/2020)  . [DISCONTINUED] famotidine (PEPCID) 20 MG tablet Take 1 tablet (20 mg total) by mouth 2 (two) times daily. (Patient not taking: Reported on 12/26/2020)   No facility-administered encounter medications on file as of 12/26/2020.    Review of Systems  Constitutional: Negative for appetite change and unexpected weight change.  HENT: Negative for congestion, sinus pressure and sore throat.   Eyes: Negative for visual disturbance.  Respiratory: Negative for cough and chest tightness.        Breathing stable.    Cardiovascular: Negative for chest pain and leg swelling.       Palpitations as outlined previously.   Gastrointestinal: Negative for abdominal pain, diarrhea, nausea and vomiting.  Genitourinary: Negative for difficulty urinating and dysuria.  Musculoskeletal: Negative for joint swelling and myalgias.  Neurological: Positive for light-headedness. Negative for headaches.  Psychiatric/Behavioral: Negative for agitation and dysphoric mood.       Objective:    Physical Exam Vitals reviewed.  Constitutional:      General: She is not in acute distress.    Appearance: Normal appearance.  HENT:     Head: Normocephalic and atraumatic.     Right Ear: External ear normal.     Left Ear: External ear normal.     Mouth/Throat:     Mouth: Oropharynx is clear and moist.  Eyes:     Comments: Left eye - redness.  No acute infection.   Neck:     Thyroid: No thyromegaly.  Cardiovascular:     Rate and Rhythm: Normal rate.     Comments: Appears to be regular with premature beats.  Pulmonary:     Effort: No respiratory distress.     Breath sounds: Normal breath sounds. No wheezing.  Abdominal:      General: Bowel sounds are normal.     Palpations: Abdomen is soft.     Tenderness: There is no abdominal tenderness.  Musculoskeletal:        General: No swelling, tenderness or edema.     Cervical back: Neck supple. No tenderness.  Lymphadenopathy:     Cervical: No cervical adenopathy.  Skin:    Findings: No erythema or rash.  Neurological:     Mental Status: She is alert.  Psychiatric:        Mood and Affect: Mood normal.        Behavior: Behavior normal.     BP 98/76 (BP Location: Left Arm, Patient Position: Sitting, Cuff Size:  Normal)   Pulse (!) 36   Temp 97.9 F (36.6 C) (Oral)   Resp 15   Ht 5\' 7"  (1.702 m)   Wt 215 lb 12.8 oz (97.9 kg)   SpO2 98%   BMI 33.80 kg/m  Wt Readings from Last 3 Encounters:  12/26/20 215 lb 12.8 oz (97.9 kg)  12/04/20 216 lb (98 kg)  11/26/20 218 lb (98.9 kg)     Lab Results  Component Value Date   WBC 6.4 11/26/2020   HGB 12.3 11/26/2020   HCT 37.7 11/26/2020   PLT 404.0 (H) 11/26/2020   GLUCOSE 83 12/11/2020   CHOL 176 10/12/2018   TRIG 88.0 10/12/2018   HDL 50.00 10/12/2018   LDLCALC 108 (H) 10/12/2018   ALT 14 10/12/2018   AST 12 10/12/2018   NA 139 12/11/2020   K 4.9 12/11/2020   CL 105 12/11/2020   CREATININE 0.86 12/11/2020   BUN 9 12/11/2020   CO2 27 12/11/2020   TSH 0.72 11/26/2020   HGBA1C 5.1 07/21/2019    US THYROID  Result Date: 12/06/2020 CLINICAL DATA:  Incidental on CT. EXAM: THYROID ULTRASOUND TECHNIQUE: Ultrasound examination of the thyroid gland and adjacent soft tissues was performed. COMPARISON:  None. FINDINGS: Parenchymal Echotexture: Moderately heterogenous Isthmus: 0.8 cm Right lobe: 6.0 x 2.1 x 2.2 cm Left lobe: 5.9 x 1.9 x 1.8 cm _________________________________________________________ Estimated total number of nodules >/= 1 cm: 1 Number of spongiform nodules >/=  2 cm not described below (TR1): 0 Number of mixed cystic and solid nodules >/= 1.5 cm not described below (TR2): 0  _________________________________________________________ Nodule # 1: Location: Right; Mid Maximum size: 1.0 cm; Other 2 dimensions: 1.0 x 0.9 cm Composition: solid/almost completely solid (2) Echogenicity: hypoechoic (2) Shape: not taller-than-wide (0) Margins: smooth (0) Echogenic foci: none (0) ACR TI-RADS total points: 4. ACR TI-RADS risk category: TR4 (4-6 points). ACR TI-RADS recommendations: *Given size (>/= 1 - 1.4 cm) and appearance, a follow-up ultrasound in 1 year should be considered based on TI-RADS criteria. _________________________________________________________ IMPRESSION: Enlarged and heterogeneous thyroid gland with solitary right mid nodule measuring 1.0 cm which meets criteria (TI-RADS category 4) for 1 year ultrasound follow-up. The above is in keeping with the ACR TI-RADS recommendations - J Am Coll Radiol 2017;14:587-595. Ruthann Cancer, MD [facility] Electronically Signed   By: Ruthann Cancer MD   On: 12/06/2020 10:03       Assessment & Plan:   Problem List Items Addressed This Visit    Hypercholesterolemia    Low cholesterol diet and exercise.  Follow lipid panel.       Relevant Medications   metoprolol succinate (TOPROL-XL) 50 MG 24 hr tablet   Frequent PVCs    Has a history of frequent PVCs - bigeminy noted on EKG.  Just saw cardiology.  Unclear etiology of light headedness.  zio monitor recently placed.  Waiting for results.  Forward information to cardiology and discuss monitor results, etc - regarding question of need for further intervention.  On metoprolol.        Relevant Medications   metoprolol succinate (TOPROL-XL) 50 MG 24 hr tablet   Anemia    Recent hgb wnl.  Recheck to confirm wnl.       Thyroid nodule    Found on recent CT.  Recent thyroid ultrasound as outlined.  Has appt planned with endocrinology with question of need for biopsy.        Relevant Medications  metoprolol succinate  (TOPROL-XL) 50 MG 24 hr tablet   Pulmonary embolus, right (HCC)    On lovenox bid.  (provoked/postpartum RML subsegmental PE).        Relevant Medications   metoprolol succinate (TOPROL-XL) 50 MG 24 hr tablet   Postpartum cardiomyopathy - Primary    Evaluated by cardiology.  EF 35-40%.  Breathing overall stable.  Some increased light headedness.  Just saw cardiology.  Zio monitor placed.  EKG today - bigeminy.  Blood pressure 108-116 systolic readings.  Hold on changing medication.  D/w cardiology.        Relevant Medications   metoprolol succinate (TOPROL-XL) 50 MG 24 hr tablet   Other Relevant Orders   CBC with Differential/Platelet   Basic metabolic panel   Arrhythmia    EKG as outlined.  Further w/up as outlined.  Check cbc and metabolic panel.  Recent TSH wnl.       Relevant Medications   metoprolol succinate (TOPROL-XL) 50 MG 24 hr tablet   Other Relevant Orders   EKG 12-Lead   Pain of left calf    History of DVT.  On lovenox.  Check ultrasound to confirm no DVT.        Relevant Orders   US Venous Img Lower Unilateral Left (Completed)   Light headedness    Light headedness and head pressure as outlined.  Discussed with her today.  She is not sleeping well - new baby.  Blood pressure today ok.  Question if possible related to underlying heart rhythm change.  Recently wore zio monitor.  Results pending.  EKG as outlined.  D/w cardiology.  Also post partum.  Question if possibly related - given head fullness with leaning forward,light headedness.  No sinus symptoms.  May need neurology evaluation.           I spent 45 minutes with the patient and more than 50% of the time was spent in consultation regarding the above.  Time spent discussing her current concerns and symptoms.  Time also spent discussing further evaluation and treatment.   Dale Granger, MD

## 2020-12-26 NOTE — Assessment & Plan Note (Signed)
Has a history of frequent PVCs - bigeminy noted on EKG.  Just saw cardiology.  Unclear etiology of light headedness.  zio monitor recently placed.  Waiting for results.  Forward information to cardiology and discuss monitor results, etc - regarding question of need for further intervention.  On metoprolol.

## 2020-12-26 NOTE — Assessment & Plan Note (Signed)
Recent hgb wnl.  Recheck to confirm wnl.

## 2020-12-26 NOTE — Assessment & Plan Note (Signed)
Light headedness and head pressure as outlined.  Discussed with her today.  She is not sleeping well - new baby.  Blood pressure today ok.  Question if possible related to underlying heart rhythm change.  Recently wore zio monitor.  Results pending.  EKG as outlined.  D/w cardiology.  Also post partum.  Question if possibly related - given head fullness with leaning forward,light headedness.  No sinus symptoms.  May need neurology evaluation.

## 2020-12-26 NOTE — Assessment & Plan Note (Signed)
Evaluated by cardiology.  EF 35-40%.  Breathing overall stable.  Some increased light headedness.  Just saw cardiology.  Zio monitor placed.  EKG today - bigeminy.  Blood pressure 108-116 systolic readings.  Hold on changing medication.  D/w cardiology.

## 2020-12-26 NOTE — Assessment & Plan Note (Signed)
On lovenox bid.  (provoked/postpartum RML subsegmental PE).   

## 2020-12-26 NOTE — Assessment & Plan Note (Signed)
Low cholesterol diet and exercise.  Follow lipid panel.   

## 2020-12-31 DIAGNOSIS — N393 Stress incontinence (female) (male): Secondary | ICD-10-CM | POA: Diagnosis not present

## 2021-01-01 DIAGNOSIS — H93A3 Pulsatile tinnitus, bilateral: Secondary | ICD-10-CM | POA: Diagnosis not present

## 2021-01-01 DIAGNOSIS — R42 Dizziness and giddiness: Secondary | ICD-10-CM | POA: Diagnosis not present

## 2021-01-01 DIAGNOSIS — R519 Headache, unspecified: Secondary | ICD-10-CM | POA: Diagnosis not present

## 2021-01-02 ENCOUNTER — Other Ambulatory Visit: Payer: Self-pay | Admitting: Neurology

## 2021-01-02 ENCOUNTER — Telehealth: Payer: Self-pay | Admitting: Internal Medicine

## 2021-01-02 DIAGNOSIS — H93A3 Pulsatile tinnitus, bilateral: Secondary | ICD-10-CM

## 2021-01-02 DIAGNOSIS — R519 Headache, unspecified: Secondary | ICD-10-CM

## 2021-01-02 DIAGNOSIS — R42 Dizziness and giddiness: Secondary | ICD-10-CM

## 2021-01-02 DIAGNOSIS — R002 Palpitations: Secondary | ICD-10-CM | POA: Diagnosis not present

## 2021-01-02 NOTE — Telephone Encounter (Signed)
Pt called to go over what the cardiologist said and possible getting a second opinion

## 2021-01-02 NOTE — Telephone Encounter (Signed)
Patient scheduled for Friday to talk to you about her appointment. She is wanting to see Dr Mariah Milling.

## 2021-01-04 ENCOUNTER — Encounter: Payer: Self-pay | Admitting: Internal Medicine

## 2021-01-04 ENCOUNTER — Telehealth (INDEPENDENT_AMBULATORY_CARE_PROVIDER_SITE_OTHER): Payer: Federal, State, Local not specified - PPO | Admitting: Internal Medicine

## 2021-01-04 ENCOUNTER — Telehealth: Payer: Self-pay

## 2021-01-04 DIAGNOSIS — O903 Peripartum cardiomyopathy: Secondary | ICD-10-CM | POA: Diagnosis not present

## 2021-01-04 DIAGNOSIS — K625 Hemorrhage of anus and rectum: Secondary | ICD-10-CM | POA: Diagnosis not present

## 2021-01-04 DIAGNOSIS — I2699 Other pulmonary embolism without acute cor pulmonale: Secondary | ICD-10-CM | POA: Diagnosis not present

## 2021-01-04 NOTE — Progress Notes (Signed)
Patient ID: Michele [name], female   DOB: [birth year], 30 y.o.   MRN: [id number]   Virtual Visit via video Note  This visit type was conducted due to national recommendations for restrictions regarding the COVID-19 pandemic (e.g. social distancing).  This format is felt to be most appropriate for this patient at this time.  All issues noted in this document were discussed and addressed.  No physical exam was performed (except for noted visual exam findings with Video Visits).   I connected with Festus Holts by a video enabled telemedicine application and verified that I am speaking with the correct person using two identifiers. Location patient: home Location provider: work  Persons participating in the virtual visit: patient, provider  The limitations, risks, security and privacy concerns of performing an evaluation and management service by video and the availability of in person appointments have been discussed.  It has also been discussed with the patient that there may be a patient responsible charge related to this service. The patient expressed understanding and agreed to proceed.   Reason for visit: work in appt  HPI: Work in to discuss her recent cardiac evaluation.  She recently gave birth 7-8 weeks ago.  Was admitted 10/2020 and diagnosed with DVT, PE and HFrEF.  on lovenox.  Diagnosed with postpartum cardiomyopathy.  has seen Dr Gladstone Lighter at [facility].  had zio monitor.  results are in care everywhere.  (max HR 207, bigeminy and trigeminy and one run of VT (17 beats).  He wants to do an ablation.  Scheduled to see EP at [facility]. Wanted to discuss getting a second opinion about the ablation.  Request to see Dr Mariah Milling.  No chest pain.  Breathing stable.  No nausea or vomiting.  Dizziness/light headedness improved.    ROS: See pertinent positives and negatives per HPI.  Past Medical History:  Diagnosis Date  . History of chicken pox   . Hx of migraines   . Hx: UTI (urinary tract infection)    . Hyperlipidemia   . Palpitations     Past Surgical History:  Procedure Laterality Date  . TONSILLECTOMY AND ADENOIDECTOMY  1997    Family History  Problem Relation Age of Onset  . Hyperlipidemia Father   . Hypertension Father   . Hypertension Mother   . Breast cancer Maternal Grandmother   . Prostate cancer Maternal Grandfather   . Heart disease Maternal Grandfather   . Hypertension Maternal Grandfather   . Diabetes Maternal Grandfather   . Breast cancer Paternal Grandmother   . Hyperlipidemia Paternal Grandmother   . Hypertension Paternal Grandmother   . Diabetes Paternal Grandmother   . Mental illness Paternal Grandmother   . Hyperlipidemia Paternal Grandfather   . Stroke Paternal Grandfather   . Hypertension Paternal Grandfather     SOCIAL HX: reviewed.    Current Outpatient Medications:  .  acetaminophen (TYLENOL) 500 MG tablet, Take by mouth., Disp: , Rfl:  .  enalapril (VASOTEC) 5 MG tablet, enalapril maleate 5 mg tablet  Take 1 tablet every day by oral route., Disp: , Rfl:  .  enoxaparin (LOVENOX) 100 MG/ML injection, Inject 1 mL (100 mg total) into the skin every 12 (twelve) hours., Disp: 60 mL, Rfl: 2 .  furosemide (LASIX) 20 MG tablet, Take 20 mg by mouth as needed., Disp: , Rfl:  .  Magnesium 500 MG TABS, Take 1,000 mg by mouth daily., Disp: , Rfl:  .  MELATONIN PO, Take by mouth as needed (for sleep)., Disp: ,  Rfl:  .  metoprolol succinate (TOPROL-XL) 50 MG 24 hr tablet, Take by mouth., Disp: , Rfl:  .  omeprazole (PRILOSEC) 20 MG capsule, Take 20 mg by mouth as needed., Disp: , Rfl: 1  EXAM:  GENERAL: alert, oriented, appears well and in no acute distress  HEENT: atraumatic, conjunttiva clear, no obvious abnormalities on inspection of external nose and ears  NECK: normal movements of the head and neck  LUNGS: on inspection no signs of respiratory distress, breathing rate appears normal, no obvious gross SOB, gasping or wheezing  CV: no obvious  cyanosis  PSYCH/NEURO: pleasant and cooperative, no obvious depression or anxiety, speech and thought processing grossly intact  ASSESSMENT AND PLAN:  Discussed the following assessment and plan:  Problem List Items Addressed This Visit    Postpartum cardiomyopathy    EF 35-40%.  Continues on lasix, metoprolol and vasotec.  No sob.  Recent zio monitor placed - maximum HR 207, bigeminy and trigeminy and one run of VT (17 beats).  Cardiology recommended ablation.  Request second opinion.  appt made with Dr Mariah Milling.        Pulmonary embolus, right (HCC)    On lovenox bid.  (provoked/postpartum RML subsegmental PE).        Rectal bleeding    See last note.  Referred to GI.       Relevant Orders   Ambulatory referral to Gastroenterology       I discussed the assessment and treatment plan with the patient. The patient was provided an opportunity to ask questions and all were answered. The patient agreed with the plan and demonstrated an understanding of the instructions.   The patient was advised to call back or seek an in-person evaluation if the symptoms worsen or if the condition fails to improve as anticipated.   Dale [place], MD

## 2021-01-04 NOTE — Telephone Encounter (Signed)
Able to get in touch with pt, advised of Dr. Westley Hummer Scott's referral to Dr. Mariah Milling, she was concern d/t being 8wks postpartum having cardiac issues, Zio monitor worn with results of max HR 207, bigeminy and trigeminy, w/ one run of VT lasting 17 beats. Admitted 10/2020 dx of DVT, PR, and HRrEF. Dr. Gladstone Lighter w/[facility] recommended an ablation, has an appt with EP at [facility], although pt would like Dr. Windell Hummingbird advice, last seen 07/11/2016 in office.  Appt made with Dr. Mariah Milling next Wed 1/19 at 10:20am. Pt will call back for anything further or to reschedule if needed

## 2021-01-06 ENCOUNTER — Encounter: Payer: Self-pay | Admitting: Internal Medicine

## 2021-01-06 ENCOUNTER — Telehealth: Payer: Self-pay | Admitting: Internal Medicine

## 2021-01-06 NOTE — Assessment & Plan Note (Signed)
See last note.  Referred to GI.

## 2021-01-06 NOTE — Telephone Encounter (Signed)
Order placed for GI referral.   

## 2021-01-06 NOTE — Assessment & Plan Note (Signed)
On lovenox bid.  (provoked/postpartum RML subsegmental PE).

## 2021-01-06 NOTE — Assessment & Plan Note (Signed)
EF 35-40%.  Continues on lasix, metoprolol and vasotec.  No sob.  Recent zio monitor placed - maximum HR 207, bigeminy and trigeminy and one run of VT (17 beats).  Cardiology recommended ablation.  Request second opinion.  appt made with Dr Mariah Milling.

## 2021-01-07 NOTE — Telephone Encounter (Signed)
Michele [name] sent a my chart message requesting Dr Norma Fredrickson at [facility].  I had placed order for [name].  Do I need to place another order?

## 2021-01-07 NOTE — Telephone Encounter (Signed)
Yes. She wants to see Dr Norma Fredrickson at [facility].  Do I need to place another order for referral.  Thank you

## 2021-01-07 NOTE — Telephone Encounter (Signed)
Ok, so pt wants a referral to [facility] gast? Please advise and thank you!

## 2021-01-08 ENCOUNTER — Telehealth: Payer: Self-pay | Admitting: Cardiovascular Disease

## 2021-01-08 DIAGNOSIS — I429 Cardiomyopathy, unspecified: Secondary | ICD-10-CM | POA: Diagnosis not present

## 2021-01-08 DIAGNOSIS — Z6832 Body mass index (BMI) 32.0-32.9, adult: Secondary | ICD-10-CM | POA: Diagnosis not present

## 2021-01-08 NOTE — Telephone Encounter (Signed)
Good morning!  No its ok I sent to [facility] gastro. YW! Thank you!

## 2021-01-08 NOTE — Telephone Encounter (Signed)
  Patient Consent for Virtual Visit         Michele [name] has provided verbal consent on 01/08/2021 for a virtual visit (video or telephone).   CONSENT FOR VIRTUAL VISIT FOR:  Michele [name]  By participating in this virtual visit I agree to the following:  I hereby voluntarily request, consent and authorize [facility] and its employed or contracted physicians, physician assistants, nurse practitioners or other licensed health care professionals (the Practitioner), to provide me with telemedicine health care services (the "Services") as deemed necessary by the treating Practitioner. I acknowledge and consent to receive the Services by the Practitioner via telemedicine. I understand that the telemedicine visit will involve communicating with the Practitioner through live audiovisual communication technology and the disclosure of certain medical information by electronic transmission. I acknowledge that I have been given the opportunity to request an in-person assessment or other available alternative prior to the telemedicine visit and am voluntarily participating in the telemedicine visit.  I understand that I have the right to withhold or withdraw my consent to the use of telemedicine in the course of my care at any time, without affecting my right to future care or treatment, and that the Practitioner or I may terminate the telemedicine visit at any time. I understand that I have the right to inspect all information obtained and/or recorded in the course of the telemedicine visit and may receive copies of available information for a reasonable fee.  I understand that some of the potential risks of receiving the Services via telemedicine include:  Marland Kitchen Delay or interruption in medical evaluation due to technological equipment failure or disruption; . Information transmitted may not be sufficient (e.g. poor resolution of images) to allow for appropriate medical decision making by the  Practitioner; and/or  . In rare instances, security protocols could fail, causing a breach of personal health information.  Furthermore, I acknowledge that it is my responsibility to provide information about my medical history, conditions and care that is complete and accurate to the best of my ability. I acknowledge that Practitioner's advice, recommendations, and/or decision may be based on factors not within their control, such as incomplete or inaccurate data provided by me or distortions of diagnostic images or specimens that may result from electronic transmissions. I understand that the practice of medicine is not an exact science and that Practitioner makes no warranties or guarantees regarding treatment outcomes. I acknowledge that a copy of this consent can be made available to me via my patient portal [facility] MyChart), or I can request a printed copy by calling the office of [facility].    I understand that my insurance will be billed for this visit.   I have read or had this consent read to me. . I understand the contents of this consent, which adequately explains the benefits and risks of the Services being provided via telemedicine.  . I have been provided ample opportunity to ask questions regarding this consent and the Services and have had my questions answered to my satisfaction. . I give my informed consent for the services to be provided through the use of telemedicine in my medical care

## 2021-01-09 ENCOUNTER — Telehealth: Payer: Federal, State, Local not specified - PPO | Admitting: Cardiovascular Disease

## 2021-01-09 ENCOUNTER — Other Ambulatory Visit: Payer: Self-pay

## 2021-01-09 ENCOUNTER — Encounter: Payer: Self-pay | Admitting: Family Medicine

## 2021-01-09 ENCOUNTER — Other Ambulatory Visit (INDEPENDENT_AMBULATORY_CARE_PROVIDER_SITE_OTHER): Payer: Federal, State, Local not specified - PPO

## 2021-01-09 ENCOUNTER — Telehealth (INDEPENDENT_AMBULATORY_CARE_PROVIDER_SITE_OTHER): Payer: Federal, State, Local not specified - PPO | Admitting: Family Medicine

## 2021-01-09 DIAGNOSIS — R509 Fever, unspecified: Secondary | ICD-10-CM | POA: Insufficient documentation

## 2021-01-09 LAB — POCT INFLUENZA A/B
Influenza A, POC: NEGATIVE
Influenza B, POC: NEGATIVE

## 2021-01-09 NOTE — Progress Notes (Signed)
Virtual Visit via video Note  This visit type was conducted due to national recommendations for restrictions regarding the COVID-19 pandemic (e.g. social distancing).  This format is felt to be most appropriate for this patient at this time.  All issues noted in this document were discussed and addressed.  No physical exam was performed (except for noted visual exam findings with Video Visits).   I connected with Michele [name] today at  1:45 PM EST by a video enabled telemedicine application and verified that I am speaking with the correct person using two identifiers. Location patient: home Location provider: home office Persons participating in the virtual visit: patient, provider  I discussed the limitations, risks, security and privacy concerns of performing an evaluation and management service by telephone and the availability of in person appointments. I also discussed with the patient that there may be a patient responsible charge related to this service. The patient expressed understanding and agreed to proceed.   Reason for visit: Fever  HPI: Respiratory illness: Onset of symptoms 01/08/2019 1 in the afternoon.  Started with body aches and dizziness.  She did have a PE and DVT following the birth of her child about 2 months ago.  She remains on Lovenox.  She was also found to have heart failure possibly related to PVCs and has a planned ablation in early February.  She does note 1 missed dose of Lovenox several weeks ago.  She notes the DVT was resolved on follow-up imaging.  Patient notes today she woke up with a headache that has resolved and she has had no other additional symptoms today.  Cough-no  Congestion-no   Sinus-no   Chest-no  Post nasal drip-no  Sore throat-no  Shortness of breath-minimal yesterday with exertion  Fever-100.6 F  Taste disturbance-no  Smell disturbance-no  Covid exposure-no  Covid vaccination-yes  Covid booster-no  Medications-Tylenol    ROS:  See pertinent positives and negatives per HPI.  Past Medical History:  Diagnosis Date  . History of chicken pox   . Hx of migraines   . Hx: UTI (urinary tract infection)   . Hyperlipidemia   . Palpitations     Past Surgical History:  Procedure Laterality Date  . TONSILLECTOMY AND ADENOIDECTOMY  1997    Family History  Problem Relation Age of Onset  . Hyperlipidemia Father   . Hypertension Father   . Hypertension Mother   . Breast cancer Maternal Grandmother   . Prostate cancer Maternal Grandfather   . Heart disease Maternal Grandfather   . Hypertension Maternal Grandfather   . Diabetes Maternal Grandfather   . Breast cancer Paternal Grandmother   . Hyperlipidemia Paternal Grandmother   . Hypertension Paternal Grandmother   . Diabetes Paternal Grandmother   . Mental illness Paternal Grandmother   . Hyperlipidemia Paternal Grandfather   . Stroke Paternal Grandfather   . Hypertension Paternal Grandfather     SOCIAL HX: Non-smoker   Current Outpatient Medications:  .  acetaminophen (TYLENOL) 500 MG tablet, Take by mouth., Disp: , Rfl:  .  enalapril (VASOTEC) 5 MG tablet, enalapril maleate 5 mg tablet  Take 1 tablet every day by oral route., Disp: , Rfl:  .  enoxaparin (LOVENOX) 100 MG/ML injection, Inject 1 mL (100 mg total) into the skin every 12 (twelve) hours., Disp: 60 mL, Rfl: 2 .  furosemide (LASIX) 20 MG tablet, Take 20 mg by mouth as needed., Disp: , Rfl:  .  Magnesium 500 MG TABS, Take 1,000 mg by  mouth daily., Disp: , Rfl:  .  MELATONIN PO, Take by mouth as needed (for sleep)., Disp: , Rfl:  .  metoprolol succinate (TOPROL-XL) 50 MG 24 hr tablet, Take by mouth., Disp: , Rfl:  .  omeprazole (PRILOSEC) 20 MG capsule, Take 20 mg by mouth as needed., Disp: , Rfl: 1  EXAM:  VITALS per patient if applicable:  GENERAL: alert, oriented, appears well and in no acute distress  HEENT: atraumatic, conjunttiva clear, no obvious abnormalities on inspection of external  nose and ears  NECK: normal movements of the head and neck  LUNGS: on inspection no signs of respiratory distress, breathing rate appears normal, no obvious gross SOB, gasping or wheezing  CV: no obvious cyanosis  MS: moves all visible extremities without noticeable abnormality  PSYCH/NEURO: pleasant and cooperative, no obvious depression or anxiety, speech and thought processing grossly intact  ASSESSMENT AND PLAN:  Discussed the following assessment and plan:  Problem List Items Addressed This Visit    Fever    Patient with fever, body aches, and rhinorrhea of sudden onset yesterday.  Concerning for COVID-19 versus influenza versus other viral illness.  We will have her tested for COVID-19 and influenza.  She will remain quarantined at home to last at least until we get the test result back and contact her with further guidance.  She does have a 19-month-old at home and I advised her to wear a mask around them.  Her husband is vaccinated and has had his booster and thus he does not need to quarantine though I did advise that he wear a mask when he is around anyone and if he develops symptoms he should be tested.  Discussed that if she test positive he should be tested on day 5 after onset of her symptoms.  She will seek medical attention for worsening breathing issues, chest pain, or fevers of 103 F or higher.      Relevant Orders   Novel Coronavirus, NAA ([facility])   POCT Influenza A/B       I discussed the assessment and treatment plan with the patient. The patient was provided an opportunity to ask questions and all were answered. The patient agreed with the plan and demonstrated an understanding of the instructions.   The patient was advised to call back or seek an in-person evaluation if the symptoms worsen or if the condition fails to improve as anticipated.   Marikay Alar, MD

## 2021-01-09 NOTE — Assessment & Plan Note (Signed)
Patient with fever, body aches, and rhinorrhea of sudden onset yesterday.  Concerning for COVID-19 versus influenza versus other viral illness.  We will have her tested for COVID-19 and influenza.  She will remain quarantined at home to last at least until we get the test result back and contact her with further guidance.  She does have a 42-month-old at home and I advised her to wear a mask around them.  Her husband is vaccinated and has had his booster and thus he does not need to quarantine though I did advise that he wear a mask when he is around anyone and if he develops symptoms he should be tested.  Discussed that if she test positive he should be tested on day 5 after onset of her symptoms.  She will seek medical attention for worsening breathing issues, chest pain, or fevers of 103 F or higher.

## 2021-01-11 ENCOUNTER — Ambulatory Visit: Payer: Federal, State, Local not specified - PPO | Admitting: Cardiovascular Disease

## 2021-01-11 LAB — NOVEL CORONAVIRUS, NAA: SARS-CoV-2, NAA: NOT DETECTED

## 2021-01-11 LAB — SARS-COV-2, NAA 2 DAY TAT

## 2021-01-14 NOTE — Progress Notes (Unsigned)
Cardiology Office Note  Date:  01/15/2021   ID:  Michele [name], DOB 03/07/1991, MRN [id number]  PCP:  Dale [place], MD   Chief Complaint  Patient presents with  . New Patient (Initial Visit)    Establish care with provider for heart rhythm issues. Medications verbally reviewed with patient.     HPI:  Michele [name] is a very pleasant 30 year old woman with  history of PVCs, prior workup by cardiology in 2006 with Holter monitor at that time, echocardiogram and stress test She presents by referral from Dr. 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 The left ventricle is moderately to severely dilated in size with normal wall thickness. 2. The left ventricular systolic function is mildly to moderately decreased, LVEF is visually estimated at 35-40%. 3. The right ventricle is mildly dilated in size, with normal systolic Function.  She remains on Lovenox for PE Reports some shortness of breath on exertion, palpitations No regular exercise program, relatively sedentary given recent events  EKG personally reviewed by myself on todays visit Shows normal sinus rhythm with PVCs in a bigeminal pattern rate 87 bpm  EKG on today's visit showing normal sinus rhythm with rate 92 bpm, PVCs in a bigeminal pattern, no other significant ST or T-wave changes   PMH:   has a past medical history of History of chicken pox, migraines, UTI (urinary tract infection), Hyperlipidemia, and Palpitations.  PSH:    Past Surgical History:  Procedure Laterality Date  . TONSILLECTOMY AND ADENOIDECTOMY  1997     Current Outpatient Medications  Medication Sig Dispense Refill  . acetaminophen (TYLENOL) 500 MG tablet Take by mouth.    . enalapril (VASOTEC) 5 MG tablet Take 5 mg by mouth 2 (two) times daily.    Marland Kitchen enoxaparin (LOVENOX) 100 MG/ML injection Inject 1 mL (100 mg total) into the skin every 12 (twelve) hours. 60 mL 2  . furosemide (LASIX) 20 MG tablet Take 20 mg by mouth as needed.    . Magnesium 500 MG TABS Take 1,000 mg by mouth daily.    Marland Kitchen MELATONIN PO Take by mouth as needed (for sleep).    . metoprolol succinate (TOPROL-XL) 50 MG 24 hr tablet Take 50 mg by mouth daily.    Marland Kitchen omeprazole (PRILOSEC) 20 MG capsule Take 20 mg by mouth as needed.  1   No current facility-administered medications for this visit.     Allergies:   Patient has no known allergies.   Social History:  The patient  reports that she has never smoked. She has never used smokeless tobacco. She reports current alcohol use. She reports that she does not use drugs.   Family History:   family history includes Breast cancer in her maternal grandmother and paternal grandmother; Diabetes in her maternal grandfather and paternal grandmother; Heart disease in her maternal grandfather; Hyperlipidemia in her father, paternal grandfather, and paternal grandmother; Hypertension in her father, maternal grandfather, mother, paternal grandfather, and paternal grandmother; Mental illness in her paternal grandmother; Prostate cancer in her maternal grandfather; Stroke in her paternal grandfather.    Review of Systems: Review of Systems  Constitutional: Negative.  HENT: Negative.   Respiratory: Negative.   Cardiovascular: Negative.   Gastrointestinal: Negative.   Musculoskeletal: Negative.   Neurological: Negative.   Psychiatric/Behavioral: Negative.   All other systems reviewed and are negative.    PHYSICAL EXAM: VS:  BP 112/68 (BP Location: Left Arm, Patient Position: Sitting, Cuff Size: Normal)   Pulse 87   Ht 5\' 8"   (1.727 m)   Wt 213 lb (96.6 kg)   SpO2 99%   BMI 32.39 kg/m  , BMI Body mass index is 32.39 kg/m. GEN: Well nourished, well developed, in no acute distress HEENT: normal Neck: no JVD, carotid bruits, or masses Cardiac: RRR; ectopy appreciated, no murmurs, rubs, or gallops,no edema  Respiratory:  clear to auscultation bilaterally, normal work of breathing GI: soft, nontender, nondistended, + BS MS: no deformity or atrophy Skin: warm and dry, no rash Neuro:  Strength and sensation are intact Psych: euthymic mood, full affect   Recent Labs: 11/26/2020: TSH 0.72 12/26/2020: BUN 10; Creatinine, Ser 0.88; Hemoglobin 13.3; Platelets 313.0; Potassium 4.6; Sodium 139    Lipid Panel Lab Results  Component Value Date   CHOL 176 10/12/2018   HDL 50.00 10/12/2018   LDLCALC 108 (H) 10/12/2018   TRIG 88.0 10/12/2018      Wt Readings from Last 3 Encounters:  01/15/21 213 lb (96.6 kg)  01/09/21 215 lb (97.5 kg)  12/26/20 215 lb 12.8 oz (97.9 kg)      ASSESSMENT AND PLAN:  Problem List Items Addressed This Visit      Cardiology Problems   Pulmonary embolus, right (HCC)   Hypercholesterolemia   Frequent PVCs - Primary    Other Visit Diagnoses    Dilated cardiomyopathy (HCC)         Frequent PVCs In the setting of recently diagnosed cardiomyopathy Repeat echocardiogram in 2 days time to estimate ejection fraction Has follow-up with EP to discuss ablation Given her young age, she is looking for a more permanent solution for her ectopy  Dilated cardiomyopathy Difficult time for her in the setting of postpartum with DVT PE, Unable to exclude other factors as a cause of her cardiomyopathy including PVCs Repeat study pending to help guide therapy -Tolerating metoprolol with very low-dose ACE inhibitor Some orthostasis at times, no further medication changes made Recommend she monitor blood pressure at home,  If ejection fraction remains low, and if there is room on her blood  pressure ideally would prefer Entresto.   DVT, pulmonary embolism On Lovenox, presumably for 6 months Appeared to be provoked in the setting of postpartum setting    Total encounter time more than 60 minutes  Greater than 50% was spent in counseling and coordination of care with the patient  Patient seen in consultation for Dr. 02/23/21 will be referred back to her office for ongoing care of the issues detailed above  Signed, Dale Conway, M.D., Ph.D. [facility] [place], [place]

## 2021-01-15 ENCOUNTER — Ambulatory Visit: Payer: Federal, State, Local not specified - PPO | Admitting: Cardiovascular Disease

## 2021-01-15 ENCOUNTER — Other Ambulatory Visit: Payer: Self-pay

## 2021-01-15 ENCOUNTER — Encounter: Payer: Self-pay | Admitting: Cardiovascular Disease

## 2021-01-15 VITALS — BP 112/68 | HR 87 | Ht 68.0 in | Wt 213.0 lb

## 2021-01-15 DIAGNOSIS — I420 Dilated cardiomyopathy: Secondary | ICD-10-CM

## 2021-01-15 DIAGNOSIS — I2699 Other pulmonary embolism without acute cor pulmonale: Secondary | ICD-10-CM | POA: Diagnosis not present

## 2021-01-15 DIAGNOSIS — I493 Ventricular premature depolarization: Secondary | ICD-10-CM

## 2021-01-15 DIAGNOSIS — E7800 Pure hypercholesterolemia, unspecified: Secondary | ICD-10-CM

## 2021-01-15 NOTE — Patient Instructions (Signed)
Medication Instructions:  No changes  If you need a refill on your cardiac medications before your next appointment, please call your pharmacy.    Lab work: No new labs needed   If you have labs (blood work) drawn today and your tests are completely normal, you will receive your results only by: . MyChart Message (if you have MyChart) OR . A paper copy in the mail If you have any lab test that is abnormal or we need to change your treatment, we will call you to review the results.   Testing/Procedures: No new testing needed   Follow-Up: At [facility], you and your health needs are our priority.  As part of our continuing mission to provide you with exceptional heart care, we have created designated Provider Care Teams.  These Care Teams include your primary Cardiologist (physician) and Advanced Practice Providers (APPs -  Physician Assistants and Nurse Practitioners) who all work together to provide you with the care you need, when you need it.  . You will need a follow up appointment as needed  . Providers on your designated Care Team:   . Christopher Berge, NP . Ryan Dunn, PA-C . Jacquelyn Visser, PA-C  Any Other Special Instructions Will Be Listed Below (If Applicable).  COVID-19 Vaccine Information can be found at: [email or web address] For questions related to vaccine distribution or appointments, please email [email or web address] or call [phone].     

## 2021-01-17 DIAGNOSIS — I493 Ventricular premature depolarization: Secondary | ICD-10-CM | POA: Diagnosis not present

## 2021-01-17 DIAGNOSIS — Z6832 Body mass index (BMI) 32.0-32.9, adult: Secondary | ICD-10-CM | POA: Diagnosis not present

## 2021-01-17 DIAGNOSIS — R931 Abnormal findings on diagnostic imaging of heart and coronary circulation: Secondary | ICD-10-CM | POA: Diagnosis not present

## 2021-01-17 DIAGNOSIS — I2693 Single subsegmental pulmonary embolism without acute cor pulmonale: Secondary | ICD-10-CM | POA: Diagnosis not present

## 2021-01-17 DIAGNOSIS — I429 Cardiomyopathy, unspecified: Secondary | ICD-10-CM | POA: Diagnosis not present

## 2021-01-24 DIAGNOSIS — Z79899 Other long term (current) drug therapy: Secondary | ICD-10-CM | POA: Diagnosis not present

## 2021-01-24 DIAGNOSIS — I10 Essential (primary) hypertension: Secondary | ICD-10-CM | POA: Diagnosis not present

## 2021-01-24 DIAGNOSIS — Z86711 Personal history of pulmonary embolism: Secondary | ICD-10-CM | POA: Diagnosis not present

## 2021-01-24 DIAGNOSIS — I429 Cardiomyopathy, unspecified: Secondary | ICD-10-CM | POA: Diagnosis not present

## 2021-01-24 DIAGNOSIS — I493 Ventricular premature depolarization: Secondary | ICD-10-CM | POA: Diagnosis not present

## 2021-01-24 DIAGNOSIS — Z86718 Personal history of other venous thrombosis and embolism: Secondary | ICD-10-CM | POA: Diagnosis not present

## 2021-01-24 DIAGNOSIS — R9431 Abnormal electrocardiogram [ECG] [EKG]: Secondary | ICD-10-CM | POA: Diagnosis not present

## 2021-02-05 DIAGNOSIS — R159 Full incontinence of feces: Secondary | ICD-10-CM | POA: Diagnosis not present

## 2021-02-05 DIAGNOSIS — L905 Scar conditions and fibrosis of skin: Secondary | ICD-10-CM | POA: Diagnosis not present

## 2021-02-05 DIAGNOSIS — N3946 Mixed incontinence: Secondary | ICD-10-CM | POA: Diagnosis not present

## 2021-02-06 DIAGNOSIS — Q048 Other specified congenital malformations of brain: Secondary | ICD-10-CM | POA: Diagnosis not present

## 2021-02-06 DIAGNOSIS — E236 Other disorders of pituitary gland: Secondary | ICD-10-CM | POA: Diagnosis not present

## 2021-02-06 DIAGNOSIS — R42 Dizziness and giddiness: Secondary | ICD-10-CM | POA: Diagnosis not present

## 2021-02-12 DIAGNOSIS — L905 Scar conditions and fibrosis of skin: Secondary | ICD-10-CM | POA: Diagnosis not present

## 2021-02-12 DIAGNOSIS — R159 Full incontinence of feces: Secondary | ICD-10-CM | POA: Diagnosis not present

## 2021-02-12 DIAGNOSIS — N3946 Mixed incontinence: Secondary | ICD-10-CM | POA: Diagnosis not present

## 2021-02-14 ENCOUNTER — Ambulatory Visit: Payer: Federal, State, Local not specified - PPO | Admitting: Internal Medicine

## 2021-02-14 ENCOUNTER — Other Ambulatory Visit: Payer: Self-pay

## 2021-02-14 DIAGNOSIS — R002 Palpitations: Secondary | ICD-10-CM | POA: Diagnosis not present

## 2021-02-14 DIAGNOSIS — I2699 Other pulmonary embolism without acute cor pulmonale: Secondary | ICD-10-CM | POA: Diagnosis not present

## 2021-02-14 DIAGNOSIS — E041 Nontoxic single thyroid nodule: Secondary | ICD-10-CM

## 2021-02-14 DIAGNOSIS — O903 Peripartum cardiomyopathy: Secondary | ICD-10-CM | POA: Diagnosis not present

## 2021-02-14 DIAGNOSIS — D649 Anemia, unspecified: Secondary | ICD-10-CM

## 2021-02-14 DIAGNOSIS — E7800 Pure hypercholesterolemia, unspecified: Secondary | ICD-10-CM

## 2021-02-14 DIAGNOSIS — R21 Rash and other nonspecific skin eruption: Secondary | ICD-10-CM

## 2021-02-14 NOTE — Progress Notes (Signed)
Patient ID: Taqwa Deem, female   DOB: 1991/01/22, 30 y.o.   MRN: [id number]   Subjective:    Patient ID: [name], female    DOB: [birth year], 30 y.o.   MRN: [id number]  HPI This visit occurred during the SARS-CoV-2 public health emergency.  Safety protocols were in place, including screening questions prior to the visit, additional usage of staff PPE, and extensive cleaning of exam room while observing appropriate contact time as indicated for disinfecting solutions.  Patient here for a scheduled follow up.  Here to follow up regarding her recent diagnosis of cardiomyopathy, dizziness and frequent PVCs.    Saw cardiology 01/15/21.  Recommended continuing metoprolol and low dose ace inhibitor.  Continues on lovenox.  Breathing stable.  S/p ablation 01/24/21.  No significant problems with palpitations or tachycardia now.  Breathing stable.  Eating.  No nausea or vomiting.  Bowels moving.   Saw Dr Sherryll Burger for head fullness/ light headedness.  MRI discussed.  Has f/u scheduled.  No headache.  No significant head fullness.  Seeing gyn in f/u for tear.  Recommended pelvic floor therapy.  She is concerned regarding rash/itching scalp/hair line.  Has seen dermatology.  Request earlier appt.    Past Medical History:  Diagnosis Date  . History of chicken pox   . Hx of migraines   . Hx: UTI (urinary tract infection)   . Hyperlipidemia   . Palpitations    Past Surgical History:  Procedure Laterality Date  . TONSILLECTOMY AND ADENOIDECTOMY  1997   Family History  Problem Relation Age of Onset  . Hyperlipidemia Father   . Hypertension Father   . Hypertension Mother   . Breast cancer Maternal Grandmother   . Prostate cancer Maternal Grandfather   . Heart disease Maternal Grandfather   . Hypertension Maternal Grandfather   . Diabetes Maternal Grandfather   . Breast cancer Paternal Grandmother   . Hyperlipidemia Paternal Grandmother   . Hypertension Paternal Grandmother   . Diabetes  Paternal Grandmother   . Mental illness Paternal Grandmother   . Hyperlipidemia Paternal Grandfather   . Stroke Paternal Grandfather   . Hypertension Paternal Grandfather    Social History   Socioeconomic History  . Marital status: Married    Spouse name: Not on file  . Number of children: 0  . Years of education: Not on file  . Highest education level: Not on file  Occupational History  . Not on file  Tobacco Use  . Smoking status: Never Smoker  . Smokeless tobacco: Never Used  Vaping Use  . Vaping Use: Never used  Substance and Sexual Activity  . Alcohol use: Yes    Alcohol/week: 0.0 standard drinks  . Drug use: No  . Sexual activity: Yes  Other Topics Concern  . 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    Social Determinants of Health   Financial Resource Strain: Not on file  Food Insecurity: Not on file  Transportation Needs: Not on file  Physical Activity: Not on file  Stress: Not on file  Social Connections: Not on file    Outpatient Encounter Medications as of 02/14/2021  Medication Sig  . acetaminophen (TYLENOL) 500 MG tablet Take by mouth.  . enalapril (VASOTEC) 5 MG tablet Take 5 mg by mouth 2 (two) times daily.  Marland Kitchen enoxaparin (LOVENOX) 100 MG/ML injection Inject 1 mL (100 mg total) into the  skin every 12 (twelve) hours.  . furosemide (LASIX) 20 MG tablet Take 20 mg by mouth as needed.  . Magnesium 500 MG TABS Take 1,000 mg by mouth daily.  Marland Kitchen MELATONIN PO Take by mouth as needed (for sleep).  . metoprolol succinate (TOPROL-XL) 50 MG 24 hr tablet Take 50 mg by mouth daily.  Marland Kitchen omeprazole (PRILOSEC) 20 MG capsule Take 20 mg by mouth as needed.   No facility-administered encounter medications on file as of 02/14/2021.    Review of Systems  Constitutional: Negative for appetite change and unexpected weight change.  HENT: Negative for congestion and sinus  pressure.   Respiratory: Negative for cough, chest tightness and shortness of breath.   Cardiovascular: Negative for chest pain, palpitations and leg swelling.  Gastrointestinal: Negative for abdominal pain, diarrhea, nausea and vomiting.  Genitourinary: Negative for difficulty urinating and dysuria.  Musculoskeletal: Negative for joint swelling and myalgias.  Skin: Negative for color change and rash.  Neurological: Negative for dizziness, light-headedness and headaches.  Psychiatric/Behavioral: Negative for agitation and dysphoric mood.       Objective:    Physical Exam Vitals reviewed.  Constitutional:      General: She is not in acute distress.    Appearance: Normal appearance.  HENT:     Head: Normocephalic and atraumatic.     Right Ear: External ear normal.     Left Ear: External ear normal.     Mouth/Throat:     Mouth: Oropharynx is clear and moist.  Eyes:     General: No scleral icterus.       Right eye: No discharge.        Left eye: No discharge.     Conjunctiva/sclera: Conjunctivae normal.  Neck:     Thyroid: No thyromegaly.  Cardiovascular:     Rate and Rhythm: Normal rate and regular rhythm.  Pulmonary:     Effort: No respiratory distress.     Breath sounds: Normal breath sounds. No wheezing.  Abdominal:     General: Bowel sounds are normal.     Palpations: Abdomen is soft.     Tenderness: There is no abdominal tenderness.  Musculoskeletal:        General: No swelling, tenderness or edema.     Cervical back: Neck supple. No tenderness.  Lymphadenopathy:     Cervical: No cervical adenopathy.  Skin:    Findings: No erythema.     Comments: Rash/scaling - hairline/scalp.   Neurological:     Mental Status: She is alert.  Psychiatric:        Mood and Affect: Mood normal.        Behavior: Behavior normal.     BP 116/70   Pulse 80   Temp 98 F (36.7 C) (Oral)   Resp 16   Ht 5\' 8"  (1.727 m)   Wt 211 lb 9.6 oz (96 kg)   SpO2 99%   BMI 32.17 kg/m   Wt Readings from Last 3 Encounters:  02/14/21 211 lb 9.6 oz (96 kg)  01/15/21 213 lb (96.6 kg)  01/09/21 215 lb (97.5 kg)     Lab Results  Component Value Date   WBC 6.6 12/26/2020   HGB 13.3 12/26/2020   HCT 39.4 12/26/2020   PLT 313.0 12/26/2020   GLUCOSE 85 12/26/2020   CHOL 176 10/12/2018   TRIG 88.0 10/12/2018   HDL 50.00 10/12/2018   LDLCALC 108 (H) 10/12/2018   ALT 14 10/12/2018   AST 12 10/12/2018   NA  139 12/26/2020   K 4.6 12/26/2020   CL 104 12/26/2020   CREATININE 0.88 12/26/2020   BUN 10 12/26/2020   CO2 27 12/26/2020   TSH 0.72 11/26/2020   HGBA1C 5.1 07/21/2019    US Venous Img Lower Unilateral Left  Result Date: 12/26/2020 CLINICAL DATA:  Left leg pain.  History of DVT and P. EXAM: Left LOWER EXTREMITY VENOUS DOPPLER ULTRASOUND TECHNIQUE: Gray-scale sonography with compression, as well as color and duplex ultrasound, were performed to evaluate the deep venous system(s) from the level of the common femoral vein through the popliteal and proximal calf veins. COMPARISON:  03/07/2018 FINDINGS: VENOUS Normal compressibility of the common femoral, superficial femoral, and popliteal veins, as well as the visualized calf veins. Visualized portions of profunda femoral vein and great saphenous vein unremarkable. No filling defects to suggest DVT on grayscale or color Doppler imaging. Doppler waveforms show normal direction of venous flow, normal respiratory plasticity and response to augmentation. Limited views of the contralateral common femoral vein are unremarkable. OTHER None. Limitations: none IMPRESSION: Negative. Electronically Signed   By: Paulina Fusi M.D.   On: 12/26/2020 15:12       Assessment & Plan:   Problem List Items Addressed This Visit    Anemia    Follow cbc.        Hypercholesterolemia    Low cholesterol diet and exercise.  Follow lipid panel.       Palpitations    S/p ablation and doing well.  Follow.       Postpartum cardiomyopathy     EF 35-40%.  Continues on lasix, metoprolol and vasotec.  No sob.  S/p ablation.  Breathing stable.  No sob.  No chest pain.  Follow.       Pulmonary embolus, right (HCC)    Remains on lovenox.        Rash    Localized to hairline/scalp.  Request earlier appt with [place] [facility]ology.        Thyroid nodule    Found on recent CT.  Had thyroid ultrasound as outlined previously.  F/u with endocrinology.           Dale [place], MD

## 2021-02-16 ENCOUNTER — Encounter: Payer: Self-pay | Admitting: Internal Medicine

## 2021-02-16 DIAGNOSIS — R21 Rash and other nonspecific skin eruption: Secondary | ICD-10-CM | POA: Insufficient documentation

## 2021-02-16 NOTE — Assessment & Plan Note (Signed)
Follow cbc.  

## 2021-02-16 NOTE — Assessment & Plan Note (Signed)
Localized to hairline/scalp.  Request earlier appt with [place] [facility].

## 2021-02-16 NOTE — Assessment & Plan Note (Signed)
EF 35-40%.  Continues on lasix, metoprolol and vasotec.  No sob.  S/p ablation.  Breathing stable.  No sob.  No chest pain.  Follow.

## 2021-02-16 NOTE — Assessment & Plan Note (Signed)
Remains on lovenox.

## 2021-02-16 NOTE — Assessment & Plan Note (Signed)
Found on recent CT.  Had thyroid ultrasound as outlined previously.  F/u with endocrinology.

## 2021-02-16 NOTE — Assessment & Plan Note (Signed)
Low cholesterol diet and exercise.  Follow lipid panel.   

## 2021-02-16 NOTE — Assessment & Plan Note (Signed)
S/p ablation and doing well.  Follow.

## 2021-02-19 DIAGNOSIS — E041 Nontoxic single thyroid nodule: Secondary | ICD-10-CM | POA: Diagnosis not present

## 2021-02-25 ENCOUNTER — Encounter: Payer: Self-pay | Admitting: Internal Medicine

## 2021-03-01 DIAGNOSIS — H93A3 Pulsatile tinnitus, bilateral: Secondary | ICD-10-CM | POA: Diagnosis not present

## 2021-03-01 DIAGNOSIS — R519 Headache, unspecified: Secondary | ICD-10-CM | POA: Diagnosis not present

## 2021-03-01 DIAGNOSIS — R42 Dizziness and giddiness: Secondary | ICD-10-CM | POA: Diagnosis not present

## 2021-03-05 ENCOUNTER — Inpatient Hospital Stay (HOSPITAL_BASED_OUTPATIENT_CLINIC_OR_DEPARTMENT_OTHER): Payer: Federal, State, Local not specified - PPO | Admitting: Internal Medicine

## 2021-03-05 ENCOUNTER — Encounter: Payer: Self-pay | Admitting: Internal Medicine

## 2021-03-05 ENCOUNTER — Inpatient Hospital Stay: Payer: Federal, State, Local not specified - PPO | Attending: Internal Medicine

## 2021-03-05 VITALS — BP 111/65 | HR 71 | Temp 97.5°F | Resp 16 | Wt 210.6 lb

## 2021-03-05 DIAGNOSIS — Z8249 Family history of ischemic heart disease and other diseases of the circulatory system: Secondary | ICD-10-CM | POA: Insufficient documentation

## 2021-03-05 DIAGNOSIS — I2699 Other pulmonary embolism without acute cor pulmonale: Secondary | ICD-10-CM

## 2021-03-05 DIAGNOSIS — Z823 Family history of stroke: Secondary | ICD-10-CM | POA: Diagnosis not present

## 2021-03-05 DIAGNOSIS — Z86718 Personal history of other venous thrombosis and embolism: Secondary | ICD-10-CM | POA: Insufficient documentation

## 2021-03-05 DIAGNOSIS — Z79899 Other long term (current) drug therapy: Secondary | ICD-10-CM | POA: Insufficient documentation

## 2021-03-05 DIAGNOSIS — Z87440 Personal history of urinary (tract) infections: Secondary | ICD-10-CM | POA: Insufficient documentation

## 2021-03-05 DIAGNOSIS — Z803 Family history of malignant neoplasm of breast: Secondary | ICD-10-CM | POA: Diagnosis not present

## 2021-03-05 DIAGNOSIS — Z7901 Long term (current) use of anticoagulants: Secondary | ICD-10-CM | POA: Insufficient documentation

## 2021-03-05 DIAGNOSIS — D6859 Other primary thrombophilia: Secondary | ICD-10-CM | POA: Diagnosis not present

## 2021-03-05 DIAGNOSIS — Z8349 Family history of other endocrine, nutritional and metabolic diseases: Secondary | ICD-10-CM | POA: Diagnosis not present

## 2021-03-05 DIAGNOSIS — Z833 Family history of diabetes mellitus: Secondary | ICD-10-CM | POA: Diagnosis not present

## 2021-03-05 DIAGNOSIS — Z818 Family history of other mental and behavioral disorders: Secondary | ICD-10-CM | POA: Diagnosis not present

## 2021-03-05 DIAGNOSIS — R5383 Other fatigue: Secondary | ICD-10-CM | POA: Insufficient documentation

## 2021-03-05 DIAGNOSIS — Z8042 Family history of malignant neoplasm of prostate: Secondary | ICD-10-CM | POA: Diagnosis not present

## 2021-03-05 DIAGNOSIS — E041 Nontoxic single thyroid nodule: Secondary | ICD-10-CM | POA: Diagnosis not present

## 2021-03-05 LAB — CBC WITH DIFFERENTIAL/PLATELET
Abs Immature Granulocytes: 0.02 10*3/uL (ref 0.00–0.07)
Basophils Absolute: 0 10*3/uL (ref 0.0–0.1)
Basophils Relative: 1 %
Eosinophils Absolute: 0.3 10*3/uL (ref 0.0–0.5)
Eosinophils Relative: 4 %
HCT: 42.4 % (ref 36.0–46.0)
Hemoglobin: 14.3 g/dL (ref 12.0–15.0)
Immature Granulocytes: 0 %
Lymphocytes Relative: 44 %
Lymphs Abs: 2.8 10*3/uL (ref 0.7–4.0)
MCH: 29.4 pg (ref 26.0–34.0)
MCHC: 33.7 g/dL (ref 30.0–36.0)
MCV: 87.2 fL (ref 80.0–100.0)
Monocytes Absolute: 0.4 10*3/uL (ref 0.1–1.0)
Monocytes Relative: 7 %
Neutro Abs: 2.8 10*3/uL (ref 1.7–7.7)
Neutrophils Relative %: 44 %
Platelets: 280 10*3/uL (ref 150–400)
RBC: 4.86 MIL/uL (ref 3.87–5.11)
RDW: 13.3 % (ref 11.5–15.5)
WBC: 6.4 10*3/uL (ref 4.0–10.5)
nRBC: 0 % (ref 0.0–0.2)

## 2021-03-05 LAB — COMPREHENSIVE METABOLIC PANEL
ALT: 19 U/L (ref 0–44)
AST: 15 U/L (ref 15–41)
Albumin: 4.2 g/dL (ref 3.5–5.0)
Alkaline Phosphatase: 55 U/L (ref 38–126)
Anion gap: 12 (ref 5–15)
BUN: 10 mg/dL (ref 6–20)
CO2: 24 mmol/L (ref 22–32)
Calcium: 9.1 mg/dL (ref 8.9–10.3)
Chloride: 103 mmol/L (ref 98–111)
Creatinine, Ser: 0.68 mg/dL (ref 0.44–1.00)
GFR, Estimated: >60 mL/min (ref >60)
Glucose, Bld: 87 mg/dL (ref 70–99)
Potassium: 4.3 mmol/L (ref 3.5–5.1)
Sodium: 139 mmol/L (ref 135–145)
Total Bilirubin: 0.5 mg/dL (ref 0.3–1.2)
Total Protein: 7.3 g/dL (ref 6.5–8.1)

## 2021-03-05 NOTE — Patient Instructions (Signed)
#   STOP LOVENOX

## 2021-03-05 NOTE — Progress Notes (Signed)
Patient here for follow up no complaints today. 

## 2021-03-05 NOTE — Assessment & Plan Note (Addendum)
#  Provoked/postpartum right middle lobe subsegmental PE-currently on Lovenox [1mg /kg] twice a day.  Patient has been on anticoagulation for approximately 4 months now.  I think is reasonable to stop anticoagulation at this time.  # ETIOLOGY: Provoked postpartum state [n k] however I would recommend checking hypercoagulable work-up number 3 months.  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 Echo today with probably normal function on meds. on ACE inhibitor beta-blocker.   # DISPOSITION: # follow up in 3 months; MD-virtual-; 2 weeks-PRIOR-cbc/bmp/hypercoagulable work up-Dr.B

## 2021-03-05 NOTE — Progress Notes (Signed)
[facility] CONSULT NOTE  Patient Care Team: Dale [place], MD as PCP - General (Internal Medicine)  CHIEF COMPLAINTS/PURPOSE OF CONSULTATION: DVT/PE  # NOV 22nd 2021- [POST PARTUM] [facility]-Right  There is no evidence of DVT in the lower extremity.  Left  There is evidence of acute DVT in the intramuscular calf veins.CT A 11/20/2021Ffilling defect in right middle lobe subsegmental pulmonary artery and concerning for small pulmonary emboli. LOVENOX 1mg  /kg BID; last 03/05/2021.   # left ventricular ejection fraction 35 to 40% concerning for postpartum cardiomyopathy [s/p ablation Dr.gehi; [facility]]; REPEAT ECHO-IMPROVED  # thyroid nodule- needs work up.     Oncology History   No history exists.   HISTORY OF PRESENTING ILLNESS:  Michele [name] 30 y.o.  female history of provoked PE/DVT postpartum-currently on Lovenox is here for follow-up.  In the interim patient was evaluated by cardiology-had cardiac ablation.  Patient states her cardiomyopathy is improved.  Complains of bruising/knots at the site of Lovenox injections.  No blood in stools black or stools.  Review of Systems  Constitutional: Positive for malaise/fatigue. Negative for chills, diaphoresis, fever and weight loss.  HENT: Negative for nosebleeds and sore throat.   Eyes: Negative for double vision.  Respiratory: Negative for cough, hemoptysis, sputum production, shortness of breath and wheezing.   Cardiovascular: Negative for chest pain, palpitations, orthopnea and leg swelling.  Gastrointestinal: Negative for abdominal pain, blood in stool, constipation, diarrhea, heartburn, melena, nausea and vomiting.  Genitourinary: Negative for dysuria, frequency and urgency.  Musculoskeletal: Negative for back pain and joint pain.  Skin: Negative.  Negative for itching and rash.  Neurological: Negative for dizziness, tingling, focal weakness, weakness and headaches.  Endo/Heme/Allergies: Does not bruise/bleed easily.   Psychiatric/Behavioral: Negative for depression. The patient is not nervous/anxious and does not have insomnia.      MEDICAL HISTORY:  Past Medical History:  Diagnosis Date  . History of chicken pox   . Hx of migraines   . Hx: UTI (urinary tract infection)   . Hyperlipidemia   . Palpitations     SURGICAL HISTORY: Past Surgical History:  Procedure Laterality Date  . TONSILLECTOMY AND ADENOIDECTOMY  1997    SOCIAL HISTORY: Social History   Socioeconomic History  . Marital status: Married    Spouse name: Not on file  . Number of children: 0  . Years of education: Not on file  . Highest education level: Not on file  Occupational History  . Not on file  Tobacco Use  . Smoking status: Never Smoker  . Smokeless tobacco: Never Used  Vaping Use  . Vaping Use: Never used  Substance and Sexual Activity  . Alcohol use: Yes    Alcohol/week: 0.0 standard drinks  . Drug use: No  . Sexual activity: Yes  Other Topics Concern  . 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    Social Determinants of Health   Financial Resource Strain: Not on file  Food Insecurity: Not on file  Transportation Needs: Not on file  Physical Activity: Not on file  Stress: Not on file  Social Connections: Not on file  Intimate Partner Violence: Not on file    FAMILY HISTORY: Family History  Problem Relation Age of Onset  . Hyperlipidemia Father   . Hypertension Father   . Hypertension Mother   . Breast cancer Maternal Grandmother   . Prostate cancer Maternal Grandfather   .  Heart disease Maternal Grandfather   . Hypertension Maternal Grandfather   . Diabetes Maternal Grandfather   . Breast cancer Paternal Grandmother   . Hyperlipidemia Paternal Grandmother   . Hypertension Paternal Grandmother   . Diabetes Paternal Grandmother   . Mental illness Paternal Grandmother   . Hyperlipidemia  Paternal Grandfather   . Stroke Paternal Grandfather   . Hypertension Paternal Grandfather     ALLERGIES:  has No Known Allergies.  MEDICATIONS:  Current Outpatient Medications  Medication Sig Dispense Refill  . acetaminophen (TYLENOL) 500 MG tablet Take by mouth.    . enalapril (VASOTEC) 5 MG tablet Take 5 mg by mouth 2 (two) times daily.    Marland Kitchen enoxaparin (LOVENOX) 100 MG/ML injection Inject 1 mL (100 mg total) into the skin every 12 (twelve) hours. 60 mL 2  . Magnesium 500 MG TABS Take 1,000 mg by mouth daily.    Marland Kitchen MELATONIN PO Take by mouth as needed (for sleep).    . metoprolol succinate (TOPROL-XL) 50 MG 24 hr tablet Take 50 mg by mouth daily.    Marland Kitchen omeprazole (PRILOSEC) 20 MG capsule Take 20 mg by mouth as needed.  1   No current facility-administered medications for this visit.      Marland Kitchen  PHYSICAL EXAMINATION:  Vitals:   03/05/21 1036  BP: 111/65  Pulse: 71  Resp: 16  Temp: (!) 97.5 F (36.4 C)  SpO2: 100%   Filed Weights   03/05/21 1036  Weight: 210 lb 9.6 oz (95.5 kg)    Physical Exam HENT:     Head: Normocephalic and atraumatic.     Mouth/Throat:     Pharynx: No oropharyngeal exudate.  Eyes:     Pupils: Pupils are equal, round, and reactive to light.  Cardiovascular:     Rate and Rhythm: Normal rate and regular rhythm.  Pulmonary:     Effort: No respiratory distress.     Breath sounds: No wheezing.  Abdominal:     General: Bowel sounds are normal. There is no distension.     Palpations: Abdomen is soft. There is no mass.     Tenderness: There is no abdominal tenderness. There is no guarding or rebound.  Musculoskeletal:        General: No tenderness. Normal range of motion.     Cervical back: Normal range of motion and neck supple.  Skin:    General: Skin is warm.  Neurological:     Mental Status: She is alert and oriented to person, place, and time.  Psychiatric:        Mood and Affect: Affect normal.      LABORATORY DATA:  I have reviewed  the data as listed Lab Results  Component Value Date   WBC 6.4 03/05/2021   HGB 14.3 03/05/2021   HCT 42.4 03/05/2021   MCV 87.2 03/05/2021   PLT 280 03/05/2021   Recent Labs    12/11/20 0958 12/26/20 1413 03/05/21 0955  NA 139 139 139  K 4.9 4.6 4.3  CL 105 104 103  CO2 27 27 24   GLUCOSE 83 85 87  BUN 9 10 10   CREATININE 0.86 0.88 0.68  CALCIUM 9.4 9.3 9.1  GFRNONAA  --   --  >60  PROT  --   --  7.3  ALBUMIN  --   --  4.2  AST  --   --  15  ALT  --   --  19  ALKPHOS  --   --  55  BILITOT  --   --  0.5    RADIOGRAPHIC STUDIES: I have personally reviewed the radiological images as listed and agreed with the findings in the report. No results found.  ASSESSMENT & PLAN:   Pulmonary embolus, right (HCC) #Provoked/postpartum right middle lobe subsegmental PE-currently on Lovenox [1mg /kg] twice a day.  Patient has been on anticoagulation for approximately 4 months now.  I think is reasonable to stop anticoagulation at this time.  # ETIOLOGY: Provoked postpartum state [n k] however I would recommend checking hypercoagulable work-up number 3 months.  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 Echo today with probably normal function on meds. on ACE inhibitor beta-blocker.   # DISPOSITION: # follow up in 3 months; MD-virtual-; 2 weeks-PRIOR-cbc/bmp/hypercoagulable work up-Dr.B  All questions were answered. The patient knows to call the clinic with any problems, questions or concerns.     Earna Coder, MD 03/05/2021 12:33 PM

## 2021-03-14 DIAGNOSIS — R198 Other specified symptoms and signs involving the digestive system and abdomen: Secondary | ICD-10-CM | POA: Diagnosis not present

## 2021-03-14 DIAGNOSIS — K648 Other hemorrhoids: Secondary | ICD-10-CM | POA: Diagnosis not present

## 2021-03-14 DIAGNOSIS — K921 Melena: Secondary | ICD-10-CM | POA: Diagnosis not present

## 2021-03-18 ENCOUNTER — Encounter: Payer: Self-pay | Admitting: Internal Medicine

## 2021-03-19 ENCOUNTER — Other Ambulatory Visit: Payer: Self-pay

## 2021-03-19 DIAGNOSIS — D6859 Other primary thrombophilia: Secondary | ICD-10-CM

## 2021-03-19 DIAGNOSIS — I2699 Other pulmonary embolism without acute cor pulmonale: Secondary | ICD-10-CM

## 2021-03-28 DIAGNOSIS — I493 Ventricular premature depolarization: Secondary | ICD-10-CM | POA: Diagnosis not present

## 2021-03-28 DIAGNOSIS — I5020 Unspecified systolic (congestive) heart failure: Secondary | ICD-10-CM | POA: Diagnosis not present

## 2021-03-28 DIAGNOSIS — Z6831 Body mass index (BMI) 31.0-31.9, adult: Secondary | ICD-10-CM | POA: Diagnosis not present

## 2021-04-11 ENCOUNTER — Telehealth (INDEPENDENT_AMBULATORY_CARE_PROVIDER_SITE_OTHER): Payer: Federal, State, Local not specified - PPO | Admitting: Internal Medicine

## 2021-04-11 ENCOUNTER — Encounter: Payer: Self-pay | Admitting: Internal Medicine

## 2021-04-11 ENCOUNTER — Other Ambulatory Visit: Payer: Self-pay

## 2021-04-11 VITALS — Ht 68.0 in | Wt 210.0 lb

## 2021-04-11 DIAGNOSIS — O903 Peripartum cardiomyopathy: Secondary | ICD-10-CM | POA: Diagnosis not present

## 2021-04-11 DIAGNOSIS — I493 Ventricular premature depolarization: Secondary | ICD-10-CM | POA: Diagnosis not present

## 2021-04-11 DIAGNOSIS — R197 Diarrhea, unspecified: Secondary | ICD-10-CM

## 2021-04-11 DIAGNOSIS — E7800 Pure hypercholesterolemia, unspecified: Secondary | ICD-10-CM | POA: Diagnosis not present

## 2021-04-11 NOTE — Telephone Encounter (Signed)
Scheduled with Dr Scott 

## 2021-04-11 NOTE — Progress Notes (Signed)
Patient ID: Michele [name], female   DOB: [birth year], 30 y.o.   MRN: [id number]   Virtual Visit via video Note  This visit type was conducted due to national recommendations for restrictions regarding the COVID-19 pandemic (e.g. social distancing).  This format is felt to be most appropriate for this patient at this time.  All issues noted in this document were discussed and addressed.  No physical exam was performed (except for noted visual exam findings with Video Visits).   I connected with Festus Holts by a video enabled telemedicine application and verified that I am speaking with the correct person using two identifiers. Location patient: work Location provider: home office Persons participating in the virtual visit: patient, provider  The limitations, risks, security and privacy concerns of performing an evaluation and management service by video and the availability of in person appointments have been discussed.  It has also been discussed with the patient that there may be a patient responsible charge related to this service. The patient expressed understanding and agreed to proceed.   Reason for visit: work in appt.   HPI: Work in appt for persistent diarrhea.  States started having diarrhea for the last 10 days.  No other sick contacts.  No fever.  No nausea or vomiting.  Eating.  Occurs multiple times per day.  No blood.  No abdominal pain.  Drinks citty water.  No known exposures.  She is taking a new magnesium pill.  Discussed changing.  Continues to breast feed.  Just saw cardiology 03/28/21 - f/u regarding cardiomyopathy.  EF improved on last TTE.  Symptoms improved.  S/p ablation 01/24/21.  Continues on toprol and enalapril.     ROS: See pertinent positives and negatives per HPI.  Past Medical History:  Diagnosis Date  . History of chicken pox   . Hx of migraines   . Hx: UTI (urinary tract infection)   . Hyperlipidemia   . Palpitations     Past Surgical History:   Procedure Laterality Date  . TONSILLECTOMY AND ADENOIDECTOMY  1997    Family History  Problem Relation Age of Onset  . Hyperlipidemia Father   . Hypertension Father   . Hypertension Mother   . Breast cancer Maternal Grandmother   . Prostate cancer Maternal Grandfather   . Heart disease Maternal Grandfather   . Hypertension Maternal Grandfather   . Diabetes Maternal Grandfather   . Breast cancer Paternal Grandmother   . Hyperlipidemia Paternal Grandmother   . Hypertension Paternal Grandmother   . Diabetes Paternal Grandmother   . Mental illness Paternal Grandmother   . Hyperlipidemia Paternal Grandfather   . Stroke Paternal Grandfather   . Hypertension Paternal Grandfather     SOCIAL HX: reviewed.    Current Outpatient Medications:  .  acetaminophen (TYLENOL) 500 MG tablet, Take by mouth., Disp: , Rfl:  .  Cholecalciferol 25 MCG (1000 UT) capsule, Take 1,000 Units by mouth daily., Disp: , Rfl:  .  enalapril (VASOTEC) 5 MG tablet, Take 5 mg by mouth 2 (two) times daily., Disp: , Rfl:  .  Magnesium 500 MG TABS, Take 1,000 mg by mouth daily., Disp: , Rfl:  .  MELATONIN PO, Take by mouth as needed (for sleep)., Disp: , Rfl:  .  metoprolol succinate (TOPROL-XL) 50 MG 24 hr tablet, Take 50 mg by mouth daily., Disp: , Rfl:  .  Omega-3 Fatty Acids (FISH OIL) 1000 MG CAPS, Take 1 capsule by mouth daily., Disp: , Rfl:  .  omeprazole (PRILOSEC) 20 MG capsule, Take 20 mg by mouth as needed., Disp: , Rfl: 1 .  Prenatal Vit-Fe Fumarate-FA (PRENATAL PO), Take 1 capsule by mouth daily., Disp: , Rfl:   EXAM:  GENERAL: alert, oriented, appears well and in no acute distress  HEENT: atraumatic, conjunttiva clear, no obvious abnormalities on inspection of external nose and ears  NECK: normal movements of the head and neck  LUNGS: on inspection no signs of respiratory distress, breathing rate appears normal, no obvious gross SOB, gasping or wheezing  CV: no obvious cyanosis  PSYCH/NEURO:  pleasant and cooperative, no obvious depression or anxiety, speech and thought processing grossly intact  ASSESSMENT AND PLAN:  Discussed the following assessment and plan:  Problem List Items Addressed This Visit    Diarrhea - Primary    Persistent diarrhea.  benefiber as directed.  Yogurt.  Change magnesium as discussed.  Check stool studies.  Call with update.  Has appt with GI in May.        Relevant Orders   Gastrointestinal Panel by PCR , Stool   Frequent PVCs    Continue toprol.  Stable.       Hypercholesterolemia    Low cholesterol diet and exercise.  Follow lipid panel.       Postpartum cardiomyopathy    EF improved on last echo.  Continue on enalapril and toprol.  Breathing stable.  No sob.  Follow.           I discussed the assessment and treatment plan with the patient. The patient was provided an opportunity to ask questions and all were answered. The patient agreed with the plan and demonstrated an understanding of the instructions.   The patient was advised to call back or seek an in-person evaluation if the symptoms worsen or if the condition fails to improve as anticipated.   Dale [place], MD

## 2021-04-12 ENCOUNTER — Other Ambulatory Visit
Admission: RE | Admit: 2021-04-12 | Discharge: 2021-04-12 | Disposition: A | Discharge status: Home / Self Care | Payer: Federal, State, Local not specified - PPO | Source: Ambulatory Visit | Attending: Internal Medicine | Admitting: Internal Medicine

## 2021-04-12 DIAGNOSIS — R197 Diarrhea, unspecified: Secondary | ICD-10-CM | POA: Diagnosis not present

## 2021-04-12 LAB — GASTROINTESTINAL PANEL BY PCR, STOOL (REPLACES STOOL CULTURE)

## 2021-04-14 ENCOUNTER — Encounter: Payer: Self-pay | Admitting: Internal Medicine

## 2021-04-14 DIAGNOSIS — R197 Diarrhea, unspecified: Secondary | ICD-10-CM | POA: Insufficient documentation

## 2021-04-14 NOTE — Assessment & Plan Note (Signed)
EF improved on last echo.  Continue on enalapril and toprol.  Breathing stable.  No sob.  Follow.

## 2021-04-14 NOTE — Assessment & Plan Note (Signed)
Continue toprol.  Stable.  

## 2021-04-14 NOTE — Assessment & Plan Note (Signed)
Persistent diarrhea.  benefiber as directed.  Yogurt.  Change magnesium as discussed.  Check stool studies.  Call with update.  Has appt with GI in May.

## 2021-04-14 NOTE — Assessment & Plan Note (Signed)
Low cholesterol diet and exercise.  Follow lipid panel.   

## 2021-04-19 DIAGNOSIS — Z01818 Encounter for other preprocedural examination: Secondary | ICD-10-CM | POA: Diagnosis not present

## 2021-04-23 ENCOUNTER — Encounter: Payer: Self-pay | Admitting: Internal Medicine

## 2021-04-23 DIAGNOSIS — K640 First degree hemorrhoids: Secondary | ICD-10-CM | POA: Diagnosis not present

## 2021-04-23 DIAGNOSIS — K6389 Other specified diseases of intestine: Secondary | ICD-10-CM | POA: Diagnosis not present

## 2021-04-23 DIAGNOSIS — K591 Functional diarrhea: Secondary | ICD-10-CM | POA: Diagnosis not present

## 2021-04-23 DIAGNOSIS — R194 Change in bowel habit: Secondary | ICD-10-CM | POA: Diagnosis not present

## 2021-04-23 LAB — HM COLONOSCOPY

## 2021-04-25 DIAGNOSIS — I493 Ventricular premature depolarization: Secondary | ICD-10-CM | POA: Diagnosis not present

## 2021-04-25 DIAGNOSIS — Z6831 Body mass index (BMI) 31.0-31.9, adult: Secondary | ICD-10-CM | POA: Diagnosis not present

## 2021-04-25 DIAGNOSIS — I5020 Unspecified systolic (congestive) heart failure: Secondary | ICD-10-CM | POA: Diagnosis not present

## 2021-04-25 DIAGNOSIS — R079 Chest pain, unspecified: Secondary | ICD-10-CM | POA: Diagnosis not present

## 2021-05-03 DIAGNOSIS — L905 Scar conditions and fibrosis of skin: Secondary | ICD-10-CM | POA: Diagnosis not present

## 2021-05-03 DIAGNOSIS — N3946 Mixed incontinence: Secondary | ICD-10-CM | POA: Diagnosis not present

## 2021-05-03 DIAGNOSIS — R159 Full incontinence of feces: Secondary | ICD-10-CM | POA: Diagnosis not present

## 2021-05-08 DIAGNOSIS — L538 Other specified erythematous conditions: Secondary | ICD-10-CM | POA: Diagnosis not present

## 2021-05-08 DIAGNOSIS — L920 Granuloma annulare: Secondary | ICD-10-CM | POA: Diagnosis not present

## 2021-05-08 DIAGNOSIS — L659 Nonscarring hair loss, unspecified: Secondary | ICD-10-CM | POA: Diagnosis not present

## 2021-05-14 ENCOUNTER — Ambulatory Visit
Admission: RE | Admit: 2021-05-14 | Discharge: 2021-05-14 | Disposition: A | Discharge status: Home / Self Care | Payer: Federal, State, Local not specified - PPO | Source: Ambulatory Visit | Attending: Internal Medicine | Admitting: Internal Medicine

## 2021-05-14 ENCOUNTER — Other Ambulatory Visit: Payer: Self-pay

## 2021-05-14 ENCOUNTER — Ambulatory Visit (INDEPENDENT_AMBULATORY_CARE_PROVIDER_SITE_OTHER): Payer: Federal, State, Local not specified - PPO | Admitting: Internal Medicine

## 2021-05-14 VITALS — BP 112/70 | HR 81 | Temp 97.1°F | Resp 16 | Ht 68.0 in | Wt 208.6 lb

## 2021-05-14 DIAGNOSIS — O903 Peripartum cardiomyopathy: Secondary | ICD-10-CM | POA: Diagnosis not present

## 2021-05-14 DIAGNOSIS — R197 Diarrhea, unspecified: Secondary | ICD-10-CM | POA: Diagnosis not present

## 2021-05-14 DIAGNOSIS — E041 Nontoxic single thyroid nodule: Secondary | ICD-10-CM

## 2021-05-14 DIAGNOSIS — E7800 Pure hypercholesterolemia, unspecified: Secondary | ICD-10-CM | POA: Diagnosis not present

## 2021-05-14 DIAGNOSIS — D649 Anemia, unspecified: Secondary | ICD-10-CM

## 2021-05-14 DIAGNOSIS — I429 Cardiomyopathy, unspecified: Secondary | ICD-10-CM | POA: Diagnosis not present

## 2021-05-14 NOTE — Progress Notes (Signed)
Patient ID: Michele [name], female   DOB: [birth year], 30 y.o.   MRN: [id number]   Subjective:    Patient ID: Michele [name], female    DOB: October 24, 1991, 30 y.o.   MRN: [id number]  HPI This visit occurred during the SARS-CoV-2 public health emergency.  Safety protocols were in place, including screening questions prior to the visit, additional usage of staff PPE, and extensive cleaning of exam room while observing appropriate contact time as indicated for disinfecting solutions.  Patient here for a scheduled follow up.  Here to follow up regarding her recent evaluation for diarrhea.  Also has post partum cardiomyopathy.  Seeing cardiology. Last EF improved 12/2020.  Has f/u echo planned 07/2021.  She feels from a heart standpoint - stable.  No chest pain.  Does report having intermittent episodes - will feel like her airway is a little smaller.  Will try to cough to help ease the sensation.  May last two hours.  No known triggers. Feels acid reflux is controlled with prilosec.  No chest congestion or sob.  No abdominal pain.  Bowels moving - diarrhea better.  Stool is more formed.  May have soft stool - 1-2x/day.    Past Medical History:  Diagnosis Date  . History of chicken pox   . Hx of migraines   . Hx: UTI (urinary tract infection)   . Hyperlipidemia   . Palpitations    Past Surgical History:  Procedure Laterality Date  . TONSILLECTOMY AND ADENOIDECTOMY  1997   Family History  Problem Relation Age of Onset  . Hyperlipidemia Father   . Hypertension Father   . Hypertension Mother   . Breast cancer Maternal Grandmother   . Prostate cancer Maternal Grandfather   . Heart disease Maternal Grandfather   . Hypertension Maternal Grandfather   . Diabetes Maternal Grandfather   . Breast cancer Paternal Grandmother   . Hyperlipidemia Paternal Grandmother   . Hypertension Paternal Grandmother   . Diabetes Paternal Grandmother   . Mental illness Paternal Grandmother   . Hyperlipidemia  Paternal Grandfather   . Stroke Paternal Grandfather   . Hypertension Paternal Grandfather    Social History   Socioeconomic History  . Marital status: Married    Spouse name: Not on file  . Number of children: 0  . Years of education: Not on file  . Highest education level: Not on file  Occupational History  . Not on file  Tobacco Use  . Smoking status: Never Smoker  . Smokeless tobacco: Never Used  Vaping Use  . Vaping Use: Never used  Substance and Sexual Activity  . Alcohol use: Yes    Alcohol/week: 0.0 standard drinks  . Drug use: No  . Sexual activity: Yes  Other Topics Concern  . 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    Social Determinants of Health   Financial Resource Strain: Not on file  Food Insecurity: Not on file  Transportation Needs: Not on file  Physical Activity: Not on file  Stress: Not on file  Social Connections: Not on file    Outpatient Encounter Medications as of 05/14/2021  Medication Sig  . acetaminophen (TYLENOL) 500 MG tablet Take by mouth.  . Cholecalciferol 25 MCG (1000 UT) capsule Take 1,000 Units by mouth daily.  . enalapril (VASOTEC) 5 MG tablet Take 5 mg by mouth 2 (two) times daily.  . Magnesium 500  MG TABS Take 1,000 mg by mouth daily.  Marland Kitchen MELATONIN PO Take by mouth as needed (for sleep).  . metoprolol succinate (TOPROL-XL) 50 MG 24 hr tablet Take 50 mg by mouth daily.  . Omega-3 Fatty Acids (FISH OIL) 1000 MG CAPS Take 1 capsule by mouth daily.  Marland Kitchen omeprazole (PRILOSEC) 20 MG capsule Take 20 mg by mouth as needed.  . Prenatal Vit-Fe Fumarate-FA (PRENATAL PO) Take 1 capsule by mouth daily.   No facility-administered encounter medications on file as of 05/14/2021.    Review of Systems  Constitutional: Negative for appetite change and unexpected weight change.  HENT: Negative for congestion and sinus pressure.   Respiratory:  Negative for cough, chest tightness and shortness of breath.   Cardiovascular: Negative for chest pain, palpitations and leg swelling.  Gastrointestinal: Negative for abdominal pain, nausea and vomiting.       Soft stool as outlined.   Genitourinary: Negative for difficulty urinating and dysuria.  Musculoskeletal: Negative for joint swelling and myalgias.  Skin: Negative for color change and rash.  Neurological: Negative for dizziness, light-headedness and headaches.  Psychiatric/Behavioral: Negative for agitation and dysphoric mood.       Objective:    Physical Exam Vitals reviewed.  Constitutional:      General: She is not in acute distress.    Appearance: Normal appearance.  HENT:     Head: Normocephalic and atraumatic.     Right Ear: External ear normal.     Left Ear: External ear normal.  Eyes:     General: No scleral icterus.       Right eye: No discharge.        Left eye: No discharge.     Conjunctiva/sclera: Conjunctivae normal.  Neck:     Thyroid: No thyromegaly.  Cardiovascular:     Rate and Rhythm: Normal rate and regular rhythm.  Pulmonary:     Effort: No respiratory distress.     Breath sounds: Normal breath sounds. No wheezing.  Abdominal:     General: Bowel sounds are normal.     Palpations: Abdomen is soft.     Tenderness: There is no abdominal tenderness.  Musculoskeletal:        General: No swelling or tenderness.     Cervical back: Neck supple. No tenderness.  Lymphadenopathy:     Cervical: No cervical adenopathy.  Skin:    Findings: No erythema or rash.  Neurological:     Mental Status: She is alert.  Psychiatric:        Mood and Affect: Mood normal.        Behavior: Behavior normal.     BP 112/70   Pulse 81   Temp (!) 97.1 F (36.2 C) (Temporal)   Resp 16   Ht 5\' 8"  (1.727 m)   Wt 208 lb 9.6 oz (94.6 kg)   SpO2 99%   BMI 31.72 kg/m  Wt Readings from Last 3 Encounters:  05/14/21 208 lb 9.6 oz (94.6 kg)  04/11/21 210 lb (95.3 kg)   03/05/21 210 lb 9.6 oz (95.5 kg)     Lab Results  Component Value Date   WBC 6.4 03/05/2021   HGB 14.3 03/05/2021   HCT 42.4 03/05/2021   PLT 280 03/05/2021   GLUCOSE 87 03/05/2021   CHOL 176 10/12/2018   TRIG 88.0 10/12/2018   HDL 50.00 10/12/2018   LDLCALC 108 (H) 10/12/2018   ALT 19 03/05/2021   AST 15 03/05/2021   NA 139 03/05/2021  K 4.3 03/05/2021   CL 103 03/05/2021   CREATININE 0.68 03/05/2021   BUN 10 03/05/2021   CO2 24 03/05/2021   TSH 0.72 11/26/2020   HGBA1C 5.1 07/21/2019       Assessment & Plan:   Problem List Items Addressed This Visit    Anemia    Follow cbc.       Diarrhea    Diarrhea better.  Colon ok.  Fiber.  Follow.       Hypercholesterolemia    Low cholesterol diet and exercise.  Follow lipid panel.       Postpartum cardiomyopathy - Primary    EF improved on last echo.  Continue enalapril and toprol.  Breathing stable.  No sob.  Follow.       Relevant Orders   DG Chest 2 View (Completed)   Thyroid nodule    Found on CT.  Had thyroid ultrasound.  Followed by endocrinology.            Dale Ascutney, MD

## 2021-05-17 DIAGNOSIS — L905 Scar conditions and fibrosis of skin: Secondary | ICD-10-CM | POA: Diagnosis not present

## 2021-05-17 DIAGNOSIS — Z8759 Personal history of other complications of pregnancy, childbirth and the puerperium: Secondary | ICD-10-CM | POA: Diagnosis not present

## 2021-05-17 DIAGNOSIS — N3946 Mixed incontinence: Secondary | ICD-10-CM | POA: Diagnosis not present

## 2021-05-17 DIAGNOSIS — R159 Full incontinence of feces: Secondary | ICD-10-CM | POA: Diagnosis not present

## 2021-05-20 ENCOUNTER — Encounter: Payer: Self-pay | Admitting: Internal Medicine

## 2021-05-20 NOTE — Assessment & Plan Note (Signed)
Found on CT.  Had thyroid ultrasound.  Followed by endocrinology.

## 2021-05-20 NOTE — Assessment & Plan Note (Signed)
Follow cbc.  

## 2021-05-20 NOTE — Assessment & Plan Note (Signed)
EF improved on last echo.  Continue enalapril and toprol.  Breathing stable.  No sob.  Follow.

## 2021-05-20 NOTE — Assessment & Plan Note (Signed)
Low cholesterol diet and exercise.  Follow lipid panel.   

## 2021-05-20 NOTE — Assessment & Plan Note (Signed)
Diarrhea better.  Colon ok.  Fiber.  Follow.

## 2021-05-22 ENCOUNTER — Inpatient Hospital Stay: Payer: Federal, State, Local not specified - PPO | Attending: Internal Medicine

## 2021-05-22 DIAGNOSIS — D6859 Other primary thrombophilia: Secondary | ICD-10-CM | POA: Insufficient documentation

## 2021-05-22 DIAGNOSIS — I2699 Other pulmonary embolism without acute cor pulmonale: Secondary | ICD-10-CM | POA: Insufficient documentation

## 2021-05-22 LAB — ANTITHROMBIN III: AntiThromb III Func: 112 % (ref 75–120)

## 2021-05-22 LAB — BASIC METABOLIC PANEL
Anion gap: 11 (ref 5–15)
BUN: 10 mg/dL (ref 6–20)
CO2: 25 mmol/L (ref 22–32)
Calcium: 8.9 mg/dL (ref 8.9–10.3)
Chloride: 101 mmol/L (ref 98–111)
Creatinine, Ser: 0.62 mg/dL (ref 0.44–1.00)
GFR, Estimated: >60 mL/min (ref >60)
Glucose, Bld: 112 mg/dL — ABNORMAL HIGH (ref 70–99)
Potassium: 3.7 mmol/L (ref 3.5–5.1)
Sodium: 137 mmol/L (ref 135–145)

## 2021-05-22 LAB — CBC WITH DIFFERENTIAL/PLATELET
Abs Immature Granulocytes: 0.02 10*3/uL (ref 0.00–0.07)
Basophils Absolute: 0.1 10*3/uL (ref 0.0–0.1)
Basophils Relative: 1 %
Eosinophils Absolute: 0.3 10*3/uL (ref 0.0–0.5)
Eosinophils Relative: 4 %
HCT: 41.3 % (ref 36.0–46.0)
Hemoglobin: 14.2 g/dL (ref 12.0–15.0)
Immature Granulocytes: 0 %
Lymphocytes Relative: 42 %
Lymphs Abs: 3.3 10*3/uL (ref 0.7–4.0)
MCH: 31.6 pg (ref 26.0–34.0)
MCHC: 34.4 g/dL (ref 30.0–36.0)
MCV: 91.8 fL (ref 80.0–100.0)
Monocytes Absolute: 0.5 10*3/uL (ref 0.1–1.0)
Monocytes Relative: 6 %
Neutro Abs: 3.7 10*3/uL (ref 1.7–7.7)
Neutrophils Relative %: 47 %
Platelets: 284 10*3/uL (ref 150–400)
RBC: 4.5 MIL/uL (ref 3.87–5.11)
RDW: 13.3 % (ref 11.5–15.5)
WBC: 7.8 10*3/uL (ref 4.0–10.5)
nRBC: 0 % (ref 0.0–0.2)

## 2021-05-22 LAB — D-DIMER, QUANTITATIVE: D-Dimer, Quant: 0.46 ug/mL-FEU (ref 0.00–0.50)

## 2021-05-23 LAB — PROTEIN S, TOTAL: Protein S Ag, Total: 94 % (ref 60–150)

## 2021-05-24 LAB — PROTEIN C, TOTAL: Protein C, Total: 127 % (ref 60–150)

## 2021-05-24 LAB — ANTIPHOSPHOLIPID SYNDROME PROF
Anticardiolipin IgG: <9 GPL U/mL (ref 0–14)
Anticardiolipin IgM: 12 MPL U/mL (ref 0–12)
DRVVT: 30.7 s (ref 0.0–47.0)
PTT Lupus Anticoagulant: 31.2 s (ref 0.0–51.9)

## 2021-05-24 LAB — BETA-2-GLYCOPROTEIN I ABS, IGG/M/A
Beta-2 Glyco I IgG: <9 GPI IgG units (ref 0–20)
Beta-2-Glycoprotein I IgA: <9 GPI IgA units (ref 0–25)
Beta-2-Glycoprotein I IgM: <9 GPI IgM units (ref 0–32)

## 2021-05-27 LAB — PROTHROMBIN GENE MUTATION

## 2021-05-27 LAB — FACTOR 5 LEIDEN

## 2021-06-04 ENCOUNTER — Inpatient Hospital Stay (HOSPITAL_BASED_OUTPATIENT_CLINIC_OR_DEPARTMENT_OTHER): Payer: Federal, State, Local not specified - PPO | Admitting: Internal Medicine

## 2021-06-04 DIAGNOSIS — I2699 Other pulmonary embolism without acute cor pulmonale: Secondary | ICD-10-CM | POA: Diagnosis not present

## 2021-06-04 NOTE — Progress Notes (Signed)
I connected with Michele [name] on 06/04/2021 at  3:00 PM EDT by video enabled telemedicine visit and verified that I am speaking with the correct person using two identifiers.  I discussed the limitations, risks, security and privacy concerns of performing an evaluation and management service by telemedicine and the availability of in-person appointments. I also discussed with the patient that there may be a patient responsible charge related to this service. The patient expressed understanding and agreed to proceed.    Other persons participating in the visit and their role in the encounter: RN/medical reconciliation Patient's location: home Provider's location: office  Oncology History   No history exists.     Chief Complaint: PE   History of present illness:Michele [name] 30 y.o.  female with history of right-sided PE-is here for follow-up/review results of the hypercoagulable work-up  Patient recently diagnosed with COVID.  She has mild symptoms mild cough.  Otherwise no shortness of breath or cough.  She is currently on isolation.  Observation/objective: Alert & oriented x 3. In No acute distress.   Assessment and plan: Pulmonary embolus, right (HCC) #Provoked/postpartum right middle lobe subsegmental PE-s/p Lovenox [1mg /kg] twice a day x4 months.  Currently discontinued.  Reviewed the hypercoagulable work-up off anticoagulation-negative for any predisposing factors.  #Discussed with the patient that she should be cautious in the future if she decides to get pregnant/or if she undergoes any surgery or prolonged immobilization-as high risk of repeated thromboembolic events is high.  However I would not recommend any further anticoagulation at this time.  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 Echo today with probably normal function on meds. on ACE inhibitor beta-blocker.  Stable  # DISPOSITION: # follow  up as needed- Dr.B  Follow-up instructions:  I discussed the assessment and treatment plan with the patient.  The patient was provided an opportunity to ask questions and all were answered.  The patient agreed with the plan and demonstrated understanding of instructions.  The patient was advised to call back or seek an in person evaluation if the symptoms worsen or if the condition fails to improve as anticipated.   Dr. Louretta Shorten [facility] 06/16/2021 8:46 PM

## 2021-06-04 NOTE — Assessment & Plan Note (Addendum)
#  Provoked/postpartum right middle lobe subsegmental PE-s/p Lovenox [1mg /kg] twice a day x4 months.  Currently discontinued.  Reviewed the hypercoagulable work-up off anticoagulation-negative for any predisposing factors.  #Discussed with the patient that she should be cautious in the future if she decides to get pregnant/or if she undergoes any surgery or prolonged immobilization-as high risk of repeated thromboembolic events is high.  However I would not recommend any further anticoagulation at this time.  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 Echo today with probably normal function on meds. on ACE inhibitor beta-blocker.  Stable  # DISPOSITION: # follow up as needed- Dr.B

## 2021-06-06 DIAGNOSIS — I493 Ventricular premature depolarization: Secondary | ICD-10-CM | POA: Diagnosis not present

## 2021-06-06 DIAGNOSIS — Z6831 Body mass index (BMI) 31.0-31.9, adult: Secondary | ICD-10-CM | POA: Diagnosis not present

## 2021-06-19 ENCOUNTER — Encounter: Payer: Self-pay | Admitting: Internal Medicine

## 2021-06-27 DIAGNOSIS — K921 Melena: Secondary | ICD-10-CM | POA: Diagnosis not present

## 2021-06-27 DIAGNOSIS — K580 Irritable bowel syndrome with diarrhea: Secondary | ICD-10-CM | POA: Diagnosis not present

## 2021-06-27 DIAGNOSIS — K648 Other hemorrhoids: Secondary | ICD-10-CM | POA: Diagnosis not present

## 2021-06-27 DIAGNOSIS — K219 Gastro-esophageal reflux disease without esophagitis: Secondary | ICD-10-CM | POA: Diagnosis not present

## 2021-08-01 DIAGNOSIS — I493 Ventricular premature depolarization: Secondary | ICD-10-CM | POA: Diagnosis not present

## 2021-08-01 DIAGNOSIS — I5020 Unspecified systolic (congestive) heart failure: Secondary | ICD-10-CM | POA: Diagnosis not present

## 2021-08-03 DIAGNOSIS — Z20822 Contact with and (suspected) exposure to covid-19: Secondary | ICD-10-CM | POA: Diagnosis not present

## 2021-08-23 ENCOUNTER — Encounter: Payer: Federal, State, Local not specified - PPO | Admitting: Internal Medicine

## 2021-08-28 ENCOUNTER — Other Ambulatory Visit: Payer: Self-pay

## 2021-08-28 ENCOUNTER — Ambulatory Visit (INDEPENDENT_AMBULATORY_CARE_PROVIDER_SITE_OTHER): Payer: Federal, State, Local not specified - PPO | Admitting: Internal Medicine

## 2021-08-28 ENCOUNTER — Ambulatory Visit (INDEPENDENT_AMBULATORY_CARE_PROVIDER_SITE_OTHER): Payer: Federal, State, Local not specified - PPO

## 2021-08-28 VITALS — BP 120/70 | HR 76 | Temp 98.0°F | Resp 16 | Ht 68.0 in | Wt 207.0 lb

## 2021-08-28 DIAGNOSIS — M546 Pain in thoracic spine: Secondary | ICD-10-CM | POA: Diagnosis not present

## 2021-08-28 DIAGNOSIS — M549 Dorsalgia, unspecified: Secondary | ICD-10-CM | POA: Insufficient documentation

## 2021-08-28 DIAGNOSIS — D649 Anemia, unspecified: Secondary | ICD-10-CM

## 2021-08-28 DIAGNOSIS — Z0000 Encounter for general adult medical examination without abnormal findings: Secondary | ICD-10-CM

## 2021-08-28 DIAGNOSIS — O903 Peripartum cardiomyopathy: Secondary | ICD-10-CM

## 2021-08-28 DIAGNOSIS — Z23 Encounter for immunization: Secondary | ICD-10-CM

## 2021-08-28 DIAGNOSIS — E7800 Pure hypercholesterolemia, unspecified: Secondary | ICD-10-CM

## 2021-08-28 DIAGNOSIS — E041 Nontoxic single thyroid nodule: Secondary | ICD-10-CM | POA: Diagnosis not present

## 2021-08-28 LAB — COMPREHENSIVE METABOLIC PANEL
ALT: 12 U/L (ref 0–35)
AST: 11 U/L (ref 0–37)
Albumin: 4.5 g/dL (ref 3.5–5.2)
Alkaline Phosphatase: 71 U/L (ref 39–117)
BUN: 8 mg/dL (ref 6–23)
CO2: 26 mEq/L (ref 19–32)
Calcium: 9.5 mg/dL (ref 8.4–10.5)
Chloride: 105 mEq/L (ref 96–112)
Creatinine, Ser: 0.79 mg/dL (ref 0.40–1.20)
GFR: 99.55 mL/min (ref >60.00)
Glucose, Bld: 77 mg/dL (ref 70–99)
Potassium: 5.3 mEq/L — ABNORMAL HIGH (ref 3.5–5.1)
Sodium: 141 mEq/L (ref 135–145)
Total Bilirubin: 0.8 mg/dL (ref 0.2–1.2)
Total Protein: 7.1 g/dL (ref 6.0–8.3)

## 2021-08-28 LAB — LIPID PANEL
Cholesterol: 203 mg/dL — ABNORMAL HIGH (ref 0–200)
HDL: 67.4 mg/dL (ref >39.00)
LDL Cholesterol: 120 mg/dL — ABNORMAL HIGH (ref 0–99)
NonHDL: 135.19
Total CHOL/HDL Ratio: 3
Triglycerides: 76 mg/dL (ref 0.0–149.0)
VLDL: 15.2 mg/dL (ref 0.0–40.0)

## 2021-08-28 LAB — TSH: TSH: 2.46 u[IU]/mL (ref 0.35–5.50)

## 2021-08-28 NOTE — Progress Notes (Signed)
Patient ID: Michele [name], female   DOB: July 06, 1991, 30 y.o.   MRN: [id number]   Subjective:    Patient ID: Michele [name], female    DOB: 1991/01/10, 31 y.o.   MRN: [id number]  This visit occurred during the SARS-CoV-2 public health emergency.  Safety protocols were in place, including screening questions prior to the visit, additional usage of staff PPE, and extensive cleaning of exam room while observing appropriate contact time as indicated for disinfecting solutions.   Patient here for physical exam.    Chief Complaint  Patient presents with   Annual Exam   .   HPI Here for physical.  Doing relatively well.  Saw cardiology recently for f/u - cardiomyopathy.  Heart function normal.  No chest pain or sob reported.  Off enalapril.  Taking metoprolol.  Continues to breast feed.  No nausea or vomiting.  No abdominal pain.  Bowels moving.  Persistent back pain- with certain positions.  Has tried stretches/her own PT.  Desires xray.  Denies possibility of being pregnant.  Remaining off anticoagulation.  Saw endocrinology - 02/2021 - f/u thyroid nodule.  Recommended f/u end of year.     Past Medical History:  Diagnosis Date   History of chicken pox    Hx of migraines    Hx: UTI (urinary tract infection)    Hyperlipidemia    Palpitations    Past Surgical History:  Procedure Laterality Date   TONSILLECTOMY AND ADENOIDECTOMY  1997   Family History  Problem Relation Age of Onset   Hyperlipidemia Father    Hypertension Father    Hypertension Mother    Breast cancer Maternal Grandmother    Prostate cancer Maternal Grandfather    Heart disease Maternal Grandfather    Hypertension Maternal Grandfather    Diabetes Maternal Grandfather    Breast cancer Paternal Grandmother    Hyperlipidemia Paternal Grandmother    Hypertension Paternal Grandmother    Diabetes Paternal Grandmother    Mental illness Paternal Grandmother    Hyperlipidemia Paternal Grandfather    Stroke Paternal  Grandfather    Hypertension Paternal Grandfather    Social History   Socioeconomic History   Marital status: Married    Spouse name: Not on file   Number of children: 0   Years of education: Not on file   Highest education level: Not on file  Occupational History   Not on file  Tobacco Use   Smoking status: Never   Smokeless tobacco: Never  Vaping Use   Vaping Use: Never used  Substance and Sexual Activity   Alcohol use: Yes    Alcohol/week: 0.0 standard drinks   Drug use: No   Sexual activity: Yes  Other Topics Concern   Not on file  Social History Narrative   PT assistant at assisted living; lives in [place]; 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    Social Determinants of Health   Financial Resource Strain: Not on file  Food Insecurity: Not on file  Transportation Needs: Not on file  Physical Activity: Not on file  Stress: Not on file  Social Connections: Not on file    Review of Systems  Constitutional:  Negative for appetite change and unexpected weight change.  HENT:  Negative for congestion, sinus pressure and sore throat.   Eyes:  Negative for pain and visual disturbance.  Respiratory:  Negative for cough, chest tightness and shortness of breath.   Cardiovascular:  Negative for chest pain, palpitations and leg swelling.  Gastrointestinal:  Negative for abdominal pain, diarrhea, nausea and vomiting.  Genitourinary:  Negative for difficulty urinating and dysuria.  Musculoskeletal:  Positive for back pain. Negative for joint swelling and myalgias.  Skin:  Negative for color change and rash.  Neurological:  Negative for dizziness, light-headedness and headaches.  Hematological:  Negative for adenopathy. Does not bruise/bleed easily.  Psychiatric/Behavioral:  Negative for decreased concentration and dysphoric mood.       Objective:     BP 120/70   Pulse 76   Temp 98 F (36.7 C)   Resp 16   Ht 5\' 8"  (1.727 m)   Wt 207 lb (93.9 kg)   SpO2 99%    BMI 31.47 kg/m  Wt Readings from Last 3 Encounters:  08/28/21 207 lb (93.9 kg)  05/14/21 208 lb 9.6 oz (94.6 kg)  04/11/21 210 lb (95.3 kg)    Physical Exam Vitals reviewed.  Constitutional:      General: She is not in acute distress.    Appearance: Normal appearance. She is well-developed.  HENT:     Head: Normocephalic and atraumatic.     Right Ear: External ear normal.     Left Ear: External ear normal.  Eyes:     General: No scleral icterus.       Right eye: No discharge.        Left eye: No discharge.     Conjunctiva/sclera: Conjunctivae normal.  Neck:     Thyroid: No thyromegaly.  Cardiovascular:     Rate and Rhythm: Normal rate and regular rhythm.  Pulmonary:     Effort: No tachypnea, accessory muscle usage or respiratory distress.     Breath sounds: Normal breath sounds. No decreased breath sounds or wheezing.  Chest:  Breasts:    Right: No inverted nipple, mass, nipple discharge or tenderness (no axillary adenopathy).     Left: No inverted nipple, mass, nipple discharge or tenderness (no axilarry adenopathy).  Abdominal:     General: Bowel sounds are normal.     Palpations: Abdomen is soft.     Tenderness: There is no abdominal tenderness.  Musculoskeletal:        General: No swelling or tenderness.     Cervical back: Neck supple.     Comments: Negative SLR.    Lymphadenopathy:     Cervical: No cervical adenopathy.  Skin:    Findings: No erythema or rash.  Neurological:     Mental Status: She is alert and oriented to person, place, and time.  Psychiatric:        Mood and Affect: Mood normal.        Behavior: Behavior normal.     Outpatient Encounter Medications as of 08/28/2021  Medication Sig   acetaminophen (TYLENOL) 500 MG tablet Take by mouth.   Cholecalciferol 25 MCG (1000 UT) capsule Take 1,000 Units by mouth daily.   Magnesium 500 MG TABS Take 1,000 mg by mouth daily.   MELATONIN PO Take by mouth as needed (for sleep).   Omega-3 Fatty Acids  (FISH OIL) 1000 MG CAPS Take 1 capsule by mouth daily.   omeprazole (PRILOSEC) 20 MG capsule Take 20 mg by mouth as needed.   Prenatal Vit-Fe Fumarate-FA (PRENATAL PO) Take 1 capsule by mouth daily.   [DISCONTINUED] enalapril (VASOTEC) 5 MG tablet Take 5 mg by mouth 2 (two) times daily.   No facility-administered encounter medications on file as of 08/28/2021.     Lab Results  Component Value Date   WBC  7.8 05/22/2021   HGB 14.2 05/22/2021   HCT 41.3 05/22/2021   PLT 284 05/22/2021   GLUCOSE 77 08/28/2021   CHOL 203 (H) 08/28/2021   TRIG 76.0 08/28/2021   HDL 67.40 08/28/2021   LDLCALC 120 (H) 08/28/2021   ALT 12 08/28/2021   AST 11 08/28/2021   NA 141 08/28/2021   K 4.8 08/29/2021   CL 105 08/28/2021   CREATININE 0.79 08/28/2021   BUN 8 08/28/2021   CO2 26 08/28/2021   TSH 2.46 08/28/2021   HGBA1C 5.1 07/21/2019    DG Chest 2 View  Result Date: 05/15/2021 CLINICAL DATA:  30 year old female with postpartum cardiomyopathy EXAM: CHEST - 2 VIEW COMPARISON:  None. FINDINGS: Cardiomediastinal silhouette within normal limits in size and contour. No evidence of central vascular congestion. No interlobular septal thickening. No pneumothorax or pleural effusion. No confluent airspace disease. No acute displaced fracture IMPRESSION: No active cardiopulmonary disease. Electronically Signed   By: Gilmer Mor D.O.   On: 05/15/2021 11:30       Assessment & Plan:   Problem List Items Addressed This Visit     Anemia    Follow cbc.       Back pain    Thoracic back pain - persistent issue.  Has tried exercises.  Check xray.  Discussed formal PT referral.        Relevant Orders   DG Thoracic Spine 2 View (Completed)   Health care maintenance    Physical today 08/28/21.  PAP through gyn.  Colonoscopy 04/23/21.       Hypercholesterolemia    Low cholesterol diet and exercise.  Follow lipid panel.       Relevant Orders   Comprehensive metabolic panel (Completed)   Lipid panel  (Completed)   Postpartum cardiomyopathy    EF improved.  Off enalapril.  On metoprolol.  Breathing stable.  No chest pain.  Follow.        Thyroid nodule    Found on CT.  Saw Dr Gershon Crane in f/u.  Recommended f/u end of year.        Relevant Orders   TSH (Completed)   Other Visit Diagnoses     Routine general medical examination at a health care facility    -  Primary   Need for immunization against influenza       Relevant Orders   Flu Vaccine QUAD 49mo+IM (Fluarix, Fluzone & Alfiuria Quad PF) (Completed)        Dale Glorieta, MD

## 2021-08-29 ENCOUNTER — Other Ambulatory Visit
Admission: RE | Admit: 2021-08-29 | Discharge: 2021-08-29 | Disposition: A | Discharge status: Home / Self Care | Payer: Federal, State, Local not specified - PPO | Source: Ambulatory Visit | Attending: Internal Medicine | Admitting: Internal Medicine

## 2021-08-29 ENCOUNTER — Other Ambulatory Visit: Payer: Self-pay | Admitting: Internal Medicine

## 2021-08-29 ENCOUNTER — Encounter: Payer: Self-pay | Admitting: Internal Medicine

## 2021-08-29 DIAGNOSIS — E875 Hyperkalemia: Secondary | ICD-10-CM | POA: Diagnosis not present

## 2021-08-29 LAB — POTASSIUM: Potassium: 4.8 mmol/L (ref 3.5–5.1)

## 2021-08-29 NOTE — Telephone Encounter (Signed)
See result note to notify pt of labs and xray.  I am ok with referral.  Please see if has preference of which PT she wants to see.

## 2021-08-29 NOTE — Progress Notes (Signed)
Order placed for f/u potassium check.  

## 2021-08-29 NOTE — Progress Notes (Signed)
Left message for patient to return call back for xray results.

## 2021-08-30 ENCOUNTER — Telehealth: Payer: Self-pay

## 2021-08-30 NOTE — Telephone Encounter (Signed)
LMTCB for labs. 

## 2021-08-30 NOTE — Telephone Encounter (Signed)
See result note for documentation

## 2021-08-31 ENCOUNTER — Other Ambulatory Visit: Payer: Self-pay | Admitting: Internal Medicine

## 2021-08-31 DIAGNOSIS — M546 Pain in thoracic spine: Secondary | ICD-10-CM

## 2021-08-31 NOTE — Progress Notes (Signed)
Order placed for physical therapy referral.   

## 2021-09-01 ENCOUNTER — Encounter: Payer: Self-pay | Admitting: Internal Medicine

## 2021-09-01 ENCOUNTER — Telehealth: Payer: Self-pay | Admitting: Internal Medicine

## 2021-09-01 NOTE — Assessment & Plan Note (Signed)
Found on CT.  Saw Dr O'connell in f/u.  Recommended f/u end of year.   

## 2021-09-01 NOTE — Assessment & Plan Note (Signed)
EF improved.  Off enalapril.  On metoprolol.  Breathing stable.  No chest pain.  Follow.

## 2021-09-01 NOTE — Assessment & Plan Note (Signed)
Thoracic back pain - persistent issue.  Has tried exercises.  Check xray.  Discussed formal PT referral.

## 2021-09-01 NOTE — Telephone Encounter (Signed)
Need records from [facility].  Last pap and last couple of office notes and any labs.  Thanks

## 2021-09-01 NOTE — Assessment & Plan Note (Signed)
Follow cbc.  

## 2021-09-01 NOTE — Assessment & Plan Note (Signed)
Low cholesterol diet and exercise.  Follow lipid panel.   

## 2021-09-01 NOTE — Assessment & Plan Note (Signed)
Physical today 08/28/21.  PAP through gyn.  Colonoscopy 04/23/21.

## 2021-09-04 NOTE — Telephone Encounter (Signed)
Records requested

## 2021-09-18 ENCOUNTER — Other Ambulatory Visit: Payer: Self-pay

## 2021-09-18 ENCOUNTER — Ambulatory Visit: Payer: Federal, State, Local not specified - PPO | Attending: Internal Medicine | Admitting: Physical Therapy

## 2021-09-18 ENCOUNTER — Encounter: Payer: Self-pay | Admitting: Physical Therapy

## 2021-09-18 DIAGNOSIS — G8929 Other chronic pain: Secondary | ICD-10-CM | POA: Insufficient documentation

## 2021-09-18 DIAGNOSIS — R52 Pain, unspecified: Secondary | ICD-10-CM | POA: Insufficient documentation

## 2021-09-18 DIAGNOSIS — R279 Unspecified lack of coordination: Secondary | ICD-10-CM | POA: Diagnosis not present

## 2021-09-18 DIAGNOSIS — M546 Pain in thoracic spine: Secondary | ICD-10-CM | POA: Diagnosis not present

## 2021-09-18 NOTE — Therapy (Signed)
[facility] [address] [place], [place], [address] Phone: [phone]   Fax:  [phone]  Physical Therapy Treatment initial Evaluation 09/18/21  Patient Details  Name: Michele [name] MRN: [id number] Date of Birth: November 13, 1991 Referring Provider (PT): Dale [place] MD  Encounter Date: 09/18/2021   PT End of Session - 09/18/21 1001     Visit Number 1    Number of Visits 5    Date for PT Re-Evaluation 10/24/21    Authorization - Visit Number 1    Authorization - Number of Visits 10    Progress Note Due on Visit --    PT Start Time 0731    PT Stop Time 0816    PT Time Calculation (min) 45 min    Activity Tolerance Patient tolerated treatment well    Behavior During Therapy [facility] for tasks assessed/performed             Past Medical History:  Diagnosis Date   History of chicken pox    Hx of migraines    Hx: UTI (urinary tract infection)    Hyperlipidemia    Palpitations     Past Surgical History:  Procedure Laterality Date   TONSILLECTOMY AND ADENOIDECTOMY  1997    There were no vitals filed for this visit.   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 Pt states that it is doing better with regular exercise that include weight training and cardio. Pt has to lean to the L during daily pain for self manipulation for pain reduction and return to work related activity. Pt is currently full time and full duty.    Pertinent History PMH: PE 03/05/21, palpitations, anemia, and postpartum cardiomyopathy. Pt states that she has been cleared from her cardiologist for activity. Pt has a 31 year old boy.    Limitations Lifting    How long can you sit comfortably? WNL    How long can you stand comfortably? WNL    How long can you walk comfortably? WNL    Diagnostic tests x-ray: Normal arthritic changes    Patient Stated Goals  Reduce pain.    Currently in Pain? Yes    Pain Score 0-No pain    Pain Location Thoracic    Pain Orientation Right    Pain Descriptors / Indicators Aching;Sharp;Tightness    Pain Type Chronic pain    Pain Radiating Towards n/a    Pain Onset More than a month ago    Pain Frequency Intermittent    Aggravating Factors  Spinal ext from a flexed posture.    Pain Relieving Factors Self manipulation when leaning to the L, exercise and stretching.    Effect of Pain on Daily Activities pain with job related tasks                [facility] PT Assessment - 09/18/21 0001       Assessment   Medical Diagnosis Thoracic back pain    Referring Provider (PT) Dale [place] MD    Onset Date/Surgical Date 04/21/21    Hand Dominance Right    Next MD Visit none    Prior Therapy none      Precautions   Precautions None      Restrictions   Weight Bearing Restrictions No      Balance Screen  Has the patient fallen in the past 6 months No      Home Environment   Living Environment Private residence    Living Arrangements Children      Prior Function   Level of Independence Independent             SUBJECTIVE  Chief complaint:  Pt is PTA and 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 Pt states that it is doing better with regular exercise that include weight training and cardio. Pt has to lean to the L during daily pain for self manipulation for pain reduction and return to work related activity. Pt is currently full time and full duty.    History: PMH: PE 03/05/21, palpitations, anemia, and postpartum cardiomyopathy. Pt states that she has been cleared from her cardiologist for activity.    Referring Dx: Thoracic back pain  Referring Provider: Dale [place] MD Pain location: Mid back: lumbar thoracic junction. R side more severe then other.  Pain: Present 0/10, Best 0/10, Worst 6/10: Pain quality: Sharp during worse. More tight and ache on the R  side.  Radiating pain: None   Numbness/Tingling: None  24 hour pain behavior: Feels fine in the morning with minor stiffness, increased ache pain during forward bending activity. Feels better with exercise.  Aggravating factors: Bends forwards and return into ext. Easing factors: Self manipulation when leaning to the L, exercise and stretching.  How long can you sit: WNL  How long can you stand: WNL  How long can you walk: WNL  History of back injury, pain, surgery, or therapy:  Follow-up appointment with MD: None  Dominant hand: R hand  Imaging: x-ray: Normal arthritic changes.  Falls in the last 6 months: None  Occupational demands: Lifting, transferring pt, providing therex.  Hobbies: exercise, Being a mother.  Goals: Reduce pain.   Red flags (bowel/bladder changes, saddle paresthesia, personal history of cancer, chills/fever, night sweats, unrelenting pain, first onset of insidious LBP <20 y/o) Negative    OBJECTIVE  Mental Status Patient is oriented to person, place and time.  Recent memory is intact.  Remote memory is intact.  Attention span and concentration are intact.  Expressive speech is intact.  Patient's fund of knowledge is within normal limits for educational level.  SENSATION: Grossly intact to light touch bilateral LEs as determined by testing dermatomes L2-S2 Proprioception and hot/cold testing deferred on this date   MUSCULOSKELETAL: Tremor: None Bulk: Normal Tone: Normal No visible step-off along spinal column  Posture Slight slumped posture that was able to correct well with cueing.  Lumbar lordosis: WNL Iliac crest height: equal bilaterally Lumbar lateral shift: negative Lower crossed syndrome (tight hip flexors and erector spinae; weak gluts and abs): negative  Gait WNL with slightly toed out heel strike R>L. No antalgic gait. Mild bilat hip drop.    Palpation Mild TTP at bilat lumbar and thoracic paraspinals    Strength (out of 5) R/L 5/5  Hip flexion 5/5 Hip ER 5/5 Hip IR 5/5 Hip abduction 5/5 Hip adduction 5/5 Hip extension 5/5 Knee extension 5/5 Knee flexion 5/5 Ankle dorsiflexion  *Indicates pain   AROM (degrees) R/L (all movements include overpressure unless otherwise stated) Lumbar forward flexion (65): WNL  Lumbar extension (30): WNL Lumbar lateral flexion (25): WNL Thoracic and Lumbar rotation (30 degrees):  WNL Hip IR (0-45): WNL Hip ER (0-45):WNL Hip Flexion (0-125) WNL Hip Abduction (0-40): WNL Hip extension (0-15):  WNL Knee Flexion (0-135): WNL Knee Extension (0): WNL    Shoulder MMT and ROM deferred 2/2 to time limitations.   Repeated Movements No centralization or peripheralization of symptoms with repeated lumbar extension or flexion.    Muscle Length Hamstrings: R: Greater than normal limits  L: greater than normal limits.  Passive Accessory Intervertebral Motion (PAIVM) Pt reports mild pain during T5-L5 during CPA and bilat UPA. Hypomobility was found at each level. R UPA stiffness then L UPA.   Passive Physiological Intervertebral Motion (PPIVM) Normal flexion and extension with PPIVM testing   SPECIAL TESTS Lumbar Radiculopathy and Discogenic: Slump (SN 83, -LR 0.32):  neg bilat  SLR (SN 92, -LR 0.29): neg bilat  Crossed SLR (SP 90): neg bilat    Facet Joint: Extension-Rotation (SN 100, -LR 0.0): neg bilat   Hip: FABER (SN 81): neg bilat  FADIR (SN 94): neg bilat  Hip scour (SN 50): neg bilat   SIJ:  Thigh Thrust (SN 88, -LR 0.18): Not warrant during this time of POC.   Piriformis Syndrome: FAIR Test (SN 88, SP 83): neg bilat   Functional Tasks Lifting: Able to life 27 # with mild knee over toes. Proper spinal and hip alignment with repetitive motions. Able to correct with verbal cueing.   Squatting: mild knee over toes. Proper spinal and hip alignment with repetitive motions. Able to correct with verbal cueing.   Stairs: WNL with bilat toe out heel strike.   Sit to  stand: WNL   Forward Step-Down Test: R: Moderate knee valgus  L: mild knee valgus    PT Long Term Goals - 09/18/21 1026       PT LONG TERM GOAL #1   Title Pt will improve FOTO score to predicted improvement value of 82 to measure self reported functional abilites with ADLs.    Baseline 76    Time 5    Period Weeks    Status New    Target Date 10/24/21      PT LONG TERM GOAL #2   Title Pt will complete a 3 back-to-back work days with worse pain less than 3/10, for better adherence to work related activities.    Baseline 6/10    Time 5    Period Weeks    Status New    Target Date 10/24/21      PT LONG TERM GOAL #3   Title Pt will be able to lift 50# from ground to waist height without pain and proper lifting mechanics and alignment of the spine, hips, and bilat LE without cueing.    Baseline Able to life 27 # with mild knee over toes. Proper spinal and hip alignment with repetitive motions. Able to correct with verbal cueing.    Time 5    Period Weeks    Status New    Target Date 10/24/21                Plan - 09/18/21 1006     Clinical Impression Statement Pt is a pleasant 30 year-old female referred for: Thoracic Back pain. PT examination reveals the following deficits: AROM of thoracic, lumbar, and bilat LE WNL and no pain with OP. MMT: 5/5 all LE. Special test: all Neg bilat. FOTO: 76 with predicted value of 82. NPS: worse pain 6/10. Functional mobility: Able to lift 27# with mild knee over toes. Proper spinal and hip alignment with repetitive motions. Able to correct with verbal cueing. Pt case displays as moderate complexity with  good prognosis for optimal return to PLOF due to time from onset, current level of daily physically activity, PMH, and PLOF. Pt will benefit from skilled PT 1x/week for 5 weeks to address deficits and promote safe independence with community and home related mobility and ADLs.    Personal Factors and Comorbidities Comorbidity 3+;Profession;Time  since onset of injury/illness/exacerbation    Comorbidities PE 03/05/21, palpitations, anemia, and postpartum cardiomyopathy. Pt states that she has been cleared from her cardiologist for activity.    Examination-Activity Limitations Caring for Others;Lift;Carry    Examination-Participation Restrictions Occupation    Stability/Clinical Decision Making Evolving/Moderate complexity    Clinical Decision Making Moderate    Rehab Potential Good    PT Frequency 1x / week    PT Duration 4 weeks   5 weeks   PT Treatment/Interventions ADLs/Self Care Home Management;Biofeedback;Electrical Stimulation;Moist Heat;Traction;Ultrasound;Gait training;Stair training;Functional mobility training;Therapeutic activities;Therapeutic exercise;Balance training;Neuromuscular re-education;Manual techniques;Passive range of motion;Dry needling;Energy conservation;Taping;Joint Manipulations;Spinal Manipulations    PT Next Visit Plan Assess scapular and post chain strength and ROM. Assess TrA firing patterns.    PT Home Exercise Plan deferred at this time 2/2 currently active 7 days a week    Recommended Other Services Not at this time.    Consulted and Agree with Plan of Care Patient             Patient will benefit from skilled therapeutic intervention in order to improve the following deficits and impairments:  Abnormal gait, Cardiopulmonary status limiting activity, Decreased balance, Decreased activity tolerance, Decreased coordination, Decreased endurance, Decreased mobility, Decreased strength, Hypomobility, Postural dysfunction, Improper body mechanics, Impaired perceived functional ability, Pain  Visit Diagnosis: Chronic right-sided thoracic back pain  Unspecified lack of coordination  Pain aggravated by lifting     Problem List Patient Active Problem List   Diagnosis Date Noted   Back pain 08/28/2021   Diarrhea 04/14/2021   Rash 02/16/2021   Fever 01/09/2021   Arrhythmia 12/26/2020   Pain of  left calf 12/26/2020   Light headedness 12/26/2020   Thyroid nodule 11/27/2020   Pulmonary embolus, right (HCC) 11/27/2020   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 [address] [place], [place], [address] Phone: [phone]   Fax:  [phone]  Name: Michele [name] MRN: [id number] Date of Birth: November 04, 1991

## 2021-09-24 ENCOUNTER — Ambulatory Visit: Payer: Federal, State, Local not specified - PPO | Admitting: Physical Therapy

## 2021-09-26 ENCOUNTER — Encounter: Payer: Self-pay | Admitting: Physical Therapy

## 2021-09-26 ENCOUNTER — Other Ambulatory Visit: Payer: Self-pay

## 2021-09-26 ENCOUNTER — Ambulatory Visit: Payer: Federal, State, Local not specified - PPO | Attending: Internal Medicine | Admitting: Physical Therapy

## 2021-09-26 DIAGNOSIS — R52 Pain, unspecified: Secondary | ICD-10-CM | POA: Diagnosis not present

## 2021-09-26 DIAGNOSIS — R278 Other lack of coordination: Secondary | ICD-10-CM | POA: Insufficient documentation

## 2021-09-26 DIAGNOSIS — R262 Difficulty in walking, not elsewhere classified: Secondary | ICD-10-CM | POA: Diagnosis not present

## 2021-09-26 DIAGNOSIS — R2681 Unsteadiness on feet: Secondary | ICD-10-CM | POA: Diagnosis not present

## 2021-09-26 DIAGNOSIS — M546 Pain in thoracic spine: Secondary | ICD-10-CM | POA: Diagnosis not present

## 2021-09-26 DIAGNOSIS — R279 Unspecified lack of coordination: Secondary | ICD-10-CM | POA: Insufficient documentation

## 2021-09-26 DIAGNOSIS — G8929 Other chronic pain: Secondary | ICD-10-CM | POA: Insufficient documentation

## 2021-09-26 DIAGNOSIS — R269 Unspecified abnormalities of gait and mobility: Secondary | ICD-10-CM | POA: Diagnosis not present

## 2021-09-26 DIAGNOSIS — R2689 Other abnormalities of gait and mobility: Secondary | ICD-10-CM | POA: Insufficient documentation

## 2021-09-26 DIAGNOSIS — M6281 Muscle weakness (generalized): Secondary | ICD-10-CM | POA: Insufficient documentation

## 2021-09-26 NOTE — Therapy (Signed)
[facility] [address] [place], [place], [address] Phone: [phone]   Fax:  [phone]  Physical Therapy Treatment  Patient Details  Name: Michele [name] MRN: [id number] Date of Birth: 1991-09-09 Referring Provider (PT): Dale [place] MD   Encounter Date: 09/26/2021   PT End of Session - 09/26/21 1801     Visit Number 2    Number of Visits 6    Date for PT Re-Evaluation 10/31/21    Authorization - Visit Number 2    Authorization - Number of Visits 10    PT Start Time 1649    PT Stop Time 1731    PT Time Calculation (min) 42 min    Activity Tolerance Patient tolerated treatment well;No increased pain    Behavior During Therapy WFL for tasks assessed/performed             Past Medical History:  Diagnosis Date   History of chicken pox    Hx of migraines    Hx: UTI (urinary tract infection)    Hyperlipidemia    Palpitations     Past Surgical History:  Procedure Laterality Date   TONSILLECTOMY AND ADENOIDECTOMY  1997    There were no vitals filed for this visit.   Subjective Assessment - 09/26/21 1652     Subjective Pt presents to tx without complaints of pain at the start of tx. Pt states good adherence to daily exercise videos without increase in pain. Pt continues to report  moderate stiffness in R lower thoracic/lumbar.    Pertinent History PMH: PE 03/05/21, palpitations, anemia, and postpartum cardiomyopathy. Pt states that she has been cleared from her cardiologist for activity. Pt has a 31 year old boy.    Limitations Lifting    How long can you sit comfortably? WNL    How long can you stand comfortably? WNL    How long can you walk comfortably? WNL    Diagnostic tests x-ray: Normal arthritic changes    Patient Stated Goals Reduce pain.    Currently in Pain? No/denies    Pain Score 0-No pain    Pain Onset More than a month ago                Treatment/assessment:   Therapeutic  Exercise:   NuStep L4 with UE assist and verbal cueing for pt selected speed and extremity ROM for optimal tissue warm up without increase in back pain.    Shoulder assessment:  Shoulder flex, ext, ER, IR, Abd: WNL bilat  Elbow flex, ext: WNL bilat   UE MMT: All 5/5 without production of pain. Mild aberrant of the R scapular during shoulder elevation.    Supine Ys and Ts x30 with contact cueing to facilitate optimal scapular mobility. BW resistance only.    Supine hook lying TrA activation with towel pull for assessment of firing and pt feedback. Pt verbal counting  10 sec x5 to ensure that the pt was not undergoing valsalva maneuver. Also completed with hip marching 5x10, verbal counting utilized as well.      Manual Therapy 22 mins:   CPA and R UPA of T1-T12 30 sec bouts x 3. Increase stiffness and pain noted at T1-T6. Grade 2-3. Pt was only able to tolerate grade 2 during first bout when completing levels T1-T6.    STM bilat rhomboid, posterior rotator cuff, and levator scapulae. More focused and TrP on L>R,  PT Education - 09/26/21 1801     Education Details Pt was educated on proper TrA firing in supine.    Person(s) Educated Patient    Methods Explanation;Demonstration;Verbal cues    Comprehension Verbalized understanding;Returned demonstration                 PT Long Term Goals - 09/18/21 1026       PT LONG TERM GOAL #1   Title Pt will improve FOTO score to predicted improvement value of 82 to measure self reported functional abilites with ADLs.    Baseline 76    Time 6    Period Weeks    Status New    Target Date 10/24/21      PT LONG TERM GOAL #2   Title Pt will complete a 3 back-to-back work days with worse pain less than 3/10, for better adherence to work related activities.    Baseline 6/10    Time 6    Period Weeks    Status New    Target Date 10/24/21      PT LONG TERM GOAL #3   Title Pt will be able to lift 50# from  ground to waist height without pain and proper lifting mechanics and alignment of the spine, hips, and bilat LE without cueing.    Baseline Able to life 27 # with mild knee over toes. Proper spinal and hip alignment with repetitive motions. Able to correct with verbal cueing.    Time 6    Period Weeks    Status New    Target Date 10/24/21                   Plan - 09/26/21 1703     Clinical Impression Statement Today's tx was focused on a assessment of shoulder strength/ mobility and TrA firing abilities. Pt displayed:   Shoulder AROM: flex, ext, ER, IR, Abd: WNL bilat. Elbow AROM: flex, ext: WNL bilat. UE MMT: All 5/5 without production of pain. Mild aberrant of the R scapular during shoulder elevation. Pt was able to properly fire TrA without valsalva maneuver and towel feedback. Pt continues to display mark lumbar/thoracic endurance, strength, and ROM deficits resulting in decreased safe home/community mobiity, increased pain, and limited access to QOL. Pt. will continue to benefit from skilled physical therapy to progress POC to address remaining deficits to facilitate maximum functional capacity for optimal personal health and wellness for ADLs.    Personal Factors and Comorbidities Comorbidity 3+;Profession;Time since onset of injury/illness/exacerbation    Comorbidities PE 03/05/21, palpitations, anemia, and postpartum cardiomyopathy. Pt states that she has been cleared from her cardiologist for activity.    Examination-Activity Limitations Caring for Others;Lift;Carry    Examination-Participation Restrictions Occupation    Stability/Clinical Decision Making Evolving/Moderate complexity    Clinical Decision Making Moderate    Rehab Potential Good    PT Frequency 1x / week    PT Duration 6 weeks    PT Treatment/Interventions ADLs/Self Care Home Management;Biofeedback;Electrical Stimulation;Moist Heat;Traction;Ultrasound;Gait training;Stair training;Functional mobility  training;Therapeutic activities;Therapeutic exercise;Balance training;Neuromuscular re-education;Manual techniques;Passive range of motion;Dry needling;Energy conservation;Taping;Joint Manipulations;Spinal Manipulations    PT Next Visit Plan reassess soreness after last tx. Assess R hip ante/retro version    PT Home Exercise Plan deferred at this time 2/2 currently active 7 days a week             Patient will benefit from skilled therapeutic intervention in order to improve the following deficits and impairments:  Abnormal gait, Cardiopulmonary  status limiting activity, Decreased balance, Decreased activity tolerance, Decreased coordination, Decreased endurance, Decreased mobility, Decreased strength, Hypomobility, Postural dysfunction, Improper body mechanics, Impaired perceived functional ability, Pain  Visit Diagnosis: Chronic right-sided thoracic back pain  Unspecified lack of coordination  Pain aggravated by lifting     Problem List Patient Active Problem List   Diagnosis Date Noted   Back pain 08/28/2021   Diarrhea 04/14/2021   Rash 02/16/2021   Fever 01/09/2021   Arrhythmia 12/26/2020   Pain of left calf 12/26/2020   Light headedness 12/26/2020   Thyroid nodule 11/27/2020   Pulmonary embolus, right (HCC) 11/27/2020   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   Anemia 11/26/2020   Rectal bleeding 02/08/2020   Increased thirst 07/17/2019   Hair loss 10/12/2018   Menstrual changes 10/12/2018   Low back pain 05/04/2016   Encounter for completion of form with patient 07/31/2015   Health care maintenance 01/31/2015   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 I have  personally read, edited and approve of the note as written. Basilia Jumbo PT, DPT   [facility] [address] [place], [place], [address] Phone: [phone]   Fax:  [phone]  Name: Michele [name] MRN: [id number] Date of Birth: July 17, 1991

## 2021-09-29 ENCOUNTER — Telehealth: Payer: Federal, State, Local not specified - PPO | Admitting: Physician Assistant

## 2021-09-29 ENCOUNTER — Encounter: Payer: Self-pay | Admitting: Physician Assistant

## 2021-09-29 DIAGNOSIS — R399 Unspecified symptoms and signs involving the genitourinary system: Secondary | ICD-10-CM | POA: Diagnosis not present

## 2021-09-29 MED ORDER — CEPHALEXIN 500 MG PO CAPS: 500.0000 mg | ORAL_CAPSULE | Freq: Two times a day (BID) | ORAL | 0 refills | Status: DC

## 2021-09-29 NOTE — Progress Notes (Signed)

## 2021-10-01 ENCOUNTER — Encounter: Payer: Federal, State, Local not specified - PPO | Admitting: Physical Therapy

## 2021-10-03 ENCOUNTER — Ambulatory Visit: Payer: Federal, State, Local not specified - PPO

## 2021-10-03 ENCOUNTER — Other Ambulatory Visit: Payer: Self-pay

## 2021-10-03 DIAGNOSIS — G8929 Other chronic pain: Secondary | ICD-10-CM

## 2021-10-03 DIAGNOSIS — R262 Difficulty in walking, not elsewhere classified: Secondary | ICD-10-CM | POA: Diagnosis not present

## 2021-10-03 DIAGNOSIS — M6281 Muscle weakness (generalized): Secondary | ICD-10-CM | POA: Diagnosis not present

## 2021-10-03 DIAGNOSIS — M546 Pain in thoracic spine: Secondary | ICD-10-CM | POA: Diagnosis not present

## 2021-10-03 DIAGNOSIS — R52 Pain, unspecified: Secondary | ICD-10-CM

## 2021-10-03 DIAGNOSIS — R269 Unspecified abnormalities of gait and mobility: Secondary | ICD-10-CM | POA: Diagnosis not present

## 2021-10-03 DIAGNOSIS — R2689 Other abnormalities of gait and mobility: Secondary | ICD-10-CM | POA: Diagnosis not present

## 2021-10-03 DIAGNOSIS — R279 Unspecified lack of coordination: Secondary | ICD-10-CM | POA: Diagnosis not present

## 2021-10-03 DIAGNOSIS — R278 Other lack of coordination: Secondary | ICD-10-CM | POA: Diagnosis not present

## 2021-10-03 DIAGNOSIS — R2681 Unsteadiness on feet: Secondary | ICD-10-CM | POA: Diagnosis not present

## 2021-10-03 NOTE — Therapy (Signed)
[facility] [address]. [place], [place], [address] Phone: [phone]   Fax:  [phone]  Physical Therapy Treatment  Patient Details  Name: Michele [name] MRN: [id number] Date of Birth: [birth year] Referring Provider (PT): Dale Beardsley MD   Encounter Date: 10/03/2021   PT End of Session - 10/03/21 1719     Visit Number 3    Number of Visits 6    Date for PT Re-Evaluation 10/31/21    Authorization - Visit Number 3    Authorization - Number of Visits 10    PT Start Time 1640    PT Stop Time 1727    PT Time Calculation (min) 47 min    Activity Tolerance Patient tolerated treatment well;No increased pain    Behavior During Therapy WFL for tasks assessed/performed             Past Medical History:  Diagnosis Date   History of chicken pox    Hx of migraines    Hx: UTI (urinary tract infection)    Hyperlipidemia    Palpitations     Past Surgical History:  Procedure Laterality Date   TONSILLECTOMY AND ADENOIDECTOMY  1997    There were no vitals filed for this visit.   Subjective Assessment - 10/03/21 1642     Subjective Pt presents to tx without complaints of pain at the start of tx. Pt states focal bilat local LBP pain during "arching" of her back 3/10, that returned to baseline upon returning to a regular posture. Pt states that she still benefits from lateral flexion self-manipulation.    Pertinent History PMH: PE 03/05/21, palpitations, anemia, and postpartum cardiomyopathy. Pt states that she has been cleared from her cardiologist for activity. Pt has a 31 year old boy.    Limitations Lifting    How long can you sit comfortably? WNL    How long can you stand comfortably? WNL    How long can you walk comfortably? WNL    Diagnostic tests x-ray: Normal arthritic changes    Patient Stated Goals Reduce pain.    Currently in Pain? No/denies    Pain Score 0-No pain    Pain Location Back    Pain Orientation  Lower;Left;Right;Mid    Pain Descriptors / Indicators Aching;Sharp    Pain Onset More than a month ago              Treatment:  NuStep L 4 for 8 mins with discussion of current POC, Pain provocation task, and progression since onset of tx.    Updated HEP  *Sets and reps preformed in clinic to ensure optimal tissue loading and correct mechanics*   Access Code: [facility] URL: [email or web address] Date: 10/03/2021 Prepared by: Dorene Grebe  Exercises  Supine Posterior Pelvic Tilt - 1 x daily - 7 x weekly - 30 reps - 3 sec hold hold Supine March - 1 x daily - 7 x weekly - 30 reps Supine Bridge - 1 x daily - 7 x weekly - 30 reps - 3 sec hold hold Supine Hip Adduction Isometric with Ball - 1 x daily - 7 x weekly - 30 reps - 5 hold Supine Lower Trunk Rotation - 1 x daily - 7 x weekly - 30 reps Supine Double Knee to Chest - 1 x daily - 7 x weekly - 30 reps - 5 sec hold hold Sidelying Hip Abduction - 1 x daily - 7 x weekly - 30 reps     //  bars with 5# ankle weights bilat with only occ light touch assist, majority of movements were completed without UE assist. 3 laps each movement unless otherwise noted. Verbal and contact cueing to facilitate optimal tissue loading and correct biomechanical alignment to ensure efficient and safe movement.  1) hip marching  2) lateral stepping 3) hip ext donkey kicks  4) Step up lunge to BOSU ball x30 each movement    PT Education - 10/03/21 1719     Education Details form/technique with exercise    Person(s) Educated Patient    Methods Explanation;Demonstration;Verbal cues    Comprehension Verbalized understanding;Returned demonstration                 PT Long Term Goals - 09/18/21 1026       PT LONG TERM GOAL #1   Title Pt will improve FOTO score to predicted improvement value of 82 to measure self reported functional abilites with ADLs.    Baseline 76    Time 6    Period Weeks    Status New    Target Date  10/24/21      PT LONG TERM GOAL #2   Title Pt will complete a 3 back-to-back work days with worse pain less than 3/10, for better adherence to work related activities.    Baseline 6/10    Time 6    Period Weeks    Status New    Target Date 10/24/21      PT LONG TERM GOAL #3   Title Pt will be able to lift 50# from ground to waist height without pain and proper lifting mechanics and alignment of the spine, hips, and bilat LE without cueing.    Baseline Able to life 27 # with mild knee over toes. Proper spinal and hip alignment with repetitive motions. Able to correct with verbal cueing.    Time 6    Period Weeks    Status New    Target Date 10/24/21                   Plan - 10/03/21 1729     Clinical Impression Statement Today's tx was focused on resisted standing mobility and provided HEP to target progressive TrA firing in supine. Pt display excellent mechanics with all movement and reported no pain at the end of tx Pt continues to display mark low back/ core  endurance, strength, and ROM deficits resulting in decreased safe home/community mobiity, increased pain, and limited access to QOL. Pt. will continue to benefit from skilled physical therapy to progress POC to address remaining deficits to facilitate maximum functional capacity for optimal personal health and wellness for ADLs.    Personal Factors and Comorbidities Comorbidity 3+;Profession;Time since onset of injury/illness/exacerbation    Comorbidities PE 03/05/21, palpitations, anemia, and postpartum cardiomyopathy. Pt states that she has been cleared from her cardiologist for activity.    Examination-Activity Limitations Caring for Others;Lift;Carry    Examination-Participation Restrictions Occupation    Stability/Clinical Decision Making Evolving/Moderate complexity    Clinical Decision Making Moderate    Rehab Potential Good    PT Frequency 1x / week    PT Duration 6 weeks    PT Treatment/Interventions ADLs/Self  Care Home Management;Biofeedback;Electrical Stimulation;Moist Heat;Traction;Ultrasound;Gait training;Stair training;Functional mobility training;Therapeutic activities;Therapeutic exercise;Balance training;Neuromuscular re-education;Manual techniques;Passive range of motion;Dry needling;Energy conservation;Taping;Joint Manipulations;Spinal Manipulations    PT Next Visit Plan Reassess soreness from previous tx and possibly progress HEP with resisted hip abd    PT Home Exercise Plan  [facility]             Patient will benefit from skilled therapeutic intervention in order to improve the following deficits and impairments:  Abnormal gait, Cardiopulmonary status limiting activity, Decreased balance, Decreased activity tolerance, Decreased coordination, Decreased endurance, Decreased mobility, Decreased strength, Hypomobility, Postural dysfunction, Improper body mechanics, Impaired perceived functional ability, Pain  Visit Diagnosis: Chronic right-sided thoracic back pain  Unspecified lack of coordination  Pain aggravated by lifting     Problem List Patient Active Problem List   Diagnosis Date Noted   Back pain 08/28/2021   Diarrhea 04/14/2021   Rash 02/16/2021   Fever 01/09/2021   Arrhythmia 12/26/2020   Pain of left calf 12/26/2020   Light headedness 12/26/2020   Thyroid nodule 11/27/2020   Pulmonary embolus, right (HCC) 11/27/2020   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 Fairly IV, PT, DPT Physical Therapist-  [place]  [facility]  10/03/2021, 5:41 PM  [facility] [address] [place], [place], [address] Phone: [phone]   Fax:  [phone]  Name: Michele [name] MRN: [id number] Date of Birth: 1991-02-02

## 2021-10-03 NOTE — Patient Instructions (Signed)
Access Code: [facility] URL: [email or web address] Date: 10/03/2021 Prepared by: Dorene Grebe  Exercises  Supine Posterior Pelvic Tilt - 1 x daily - 7 x weekly - 30 reps - 3 sec hold hold Supine March - 1 x daily - 7 x weekly - 30 reps Supine Bridge - 1 x daily - 7 x weekly - 30 reps - 3 sec hold hold Supine Hip Adduction Isometric with Ball - 1 x daily - 7 x weekly - 30 reps - 5 hold Supine Lower Trunk Rotation - 1 x daily - 7 x weekly - 30 reps Supine Double Knee to Chest - 1 x daily - 7 x weekly - 30 reps - 5 sec hold hold Sidelying Hip Abduction - 1 x daily - 7 x weekly - 30 reps

## 2021-10-08 ENCOUNTER — Encounter: Payer: Federal, State, Local not specified - PPO | Admitting: Physical Therapy

## 2021-10-10 ENCOUNTER — Encounter: Payer: Self-pay | Admitting: Physical Therapy

## 2021-10-10 ENCOUNTER — Ambulatory Visit: Payer: Federal, State, Local not specified - PPO | Admitting: Physical Therapy

## 2021-10-10 ENCOUNTER — Other Ambulatory Visit: Payer: Self-pay

## 2021-10-10 DIAGNOSIS — M6281 Muscle weakness (generalized): Secondary | ICD-10-CM

## 2021-10-10 DIAGNOSIS — R52 Pain, unspecified: Secondary | ICD-10-CM | POA: Diagnosis not present

## 2021-10-10 DIAGNOSIS — R2689 Other abnormalities of gait and mobility: Secondary | ICD-10-CM | POA: Diagnosis not present

## 2021-10-10 DIAGNOSIS — R279 Unspecified lack of coordination: Secondary | ICD-10-CM | POA: Diagnosis not present

## 2021-10-10 DIAGNOSIS — R262 Difficulty in walking, not elsewhere classified: Secondary | ICD-10-CM | POA: Diagnosis not present

## 2021-10-10 DIAGNOSIS — R269 Unspecified abnormalities of gait and mobility: Secondary | ICD-10-CM

## 2021-10-10 DIAGNOSIS — M546 Pain in thoracic spine: Secondary | ICD-10-CM | POA: Diagnosis not present

## 2021-10-10 DIAGNOSIS — R278 Other lack of coordination: Secondary | ICD-10-CM

## 2021-10-10 DIAGNOSIS — G8929 Other chronic pain: Secondary | ICD-10-CM | POA: Diagnosis not present

## 2021-10-10 DIAGNOSIS — R2681 Unsteadiness on feet: Secondary | ICD-10-CM

## 2021-10-10 NOTE — Therapy (Signed)
[facility] [address] [place], [place], [address] Phone: [phone]   Fax:  [phone]  Physical Therapy Treatment  Patient Details  Name: Michele [name] MRN: [id number] Date of Birth: [birth year] Referring Provider (PT): Dale [facility] MD   Encounter Date: 10/10/2021   PT End of Session - 10/10/21 1825     Visit Number 4    Number of Visits 6    Date for PT Re-Evaluation 10/31/21    Authorization - Visit Number 3    Authorization - Number of Visits 10    PT Start Time 1641    PT Stop Time 1728    PT Time Calculation (min) 47 min    Activity Tolerance Patient tolerated treatment well;No increased pain    Behavior During Therapy WFL for tasks assessed/performed             Past Medical History:  Diagnosis Date   History of chicken pox    Hx of migraines    Hx: UTI (urinary tract infection)    Hyperlipidemia    Palpitations     Past Surgical History:  Procedure Laterality Date   TONSILLECTOMY AND ADENOIDECTOMY  1997    There were no vitals filed for this visit.   Subjective Assessment - 10/10/21 1643     Subjective Pt states she is doing well today. 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 States her lower thoracic spine is a little "achy" from working all day but denies pain. She also endorses mild L knee pain near the inferior pole of the patella/tibial tuberosity - timing of onset correlates to initation of higher-intensity plyo workouts.    Pertinent History PMH: PE 03/05/21, palpitations, anemia, and postpartum cardiomyopathy. Pt states that she has been cleared from her cardiologist for activity. Pt has a 31 year old boy.    Limitations Lifting    How long can you sit comfortably? WNL    How long can you stand comfortably? WNL    How long can you walk comfortably? WNL    Diagnostic tests x-ray: Normal arthritic changes    Patient Stated Goals Reduce pain.    Currently in Pain? No/denies    Pain Onset More  than a month ago               NuStep L4, 5 minutes (unbilled)  THEREX Squats; 3x10  Squats on BOSU; 3x10 Walking lunge holding 10lb MB overhead; 3x8 steps  Forward lunge on BOSU; 3x5 reps each LE Pistol squat, TRX; 3x10 each LE Warrior 3, TRX; 5x10 second hold each LE Plank - assessment (elbows and toes)   *pt places feet together to assist with core stabilizing; PT cued to space feet ~6 inches apart - pt demo significantly decreased muscular endurance of the core with hips elevated and difficulty attaining posterior pelvic tilt to activate TA, increased fatigue noted with shaking.  Plank on knees (and elbows) with alternating knee lift; 3x20 alternating Core: 90-90 (hips and knees) in supine with heel taps, 3x20 alternating    Next visit (ideas): Lateral step downs with heel tap, 8" step Lateral steps, green TB Forward and backward "monster walk", green RB I-T-Y with trunk extension (chest lift/BLE lift) More core!       Clinical Impression: Pt arrives to PT with excellent motivation, agreeable to all exercises. Session focused on hip and core stabilization/strengthening via interventions including unstable surfaces, single limb exercises, isometric as well as fluent movements and core  specific training. Overall, PT discovered mild weakness in B glute med - L slightly weaker than R noted with contralateral hip drop however pt reports increased fatigue in R glute. The other substantial finding is general core weakness for this patient's work-related demands, daily activities and workout regimen. Pt would benefit from continued hip and core strengthening/endurance activities in order to decrease aches and pains noted in pt lumbar and thoracic spine with lifting demands at work and possibly contribute to decreasing pt recent onset of L knee pain since beginning higher intensity plyometric workouts.              PT Long Term Goals - 09/18/21 1026       PT LONG TERM  GOAL #1   Title Pt will improve FOTO score to predicted improvement value of 82 to measure self reported functional abilites with ADLs.    Baseline 76    Time 6    Period Weeks    Status New    Target Date 10/24/21      PT LONG TERM GOAL #2   Title Pt will complete a 3 back-to-back work days with worse pain less than 3/10, for better adherence to work related activities.    Baseline 6/10    Time 6    Period Weeks    Status New    Target Date 10/24/21      PT LONG TERM GOAL #3   Title Pt will be able to lift 50# from ground to waist height without pain and proper lifting mechanics and alignment of the spine, hips, and bilat LE without cueing.    Baseline Able to life 27 # with mild knee over toes. Proper spinal and hip alignment with repetitive motions. Able to correct with verbal cueing.    Time 6    Period Weeks    Status New    Target Date 10/24/21                   Plan - 10/10/21 1826     Clinical Impression Statement Pt arrives to PT with excellent motivation, agreeable to all exercises. Session focused on hip and core stabilization/strengthening via interventions including unstable surfaces, single limb exercises, isometric as well as fluent movements and core specific training. Overall, PT discovered mild weakness in B glute med - L slightly weaker than R noted with contralateral hip drop however pt reports increased fatigue in R glute. The other substantial finding is general core weakness for this patient's work-related demands, daily activities and workout regimen. Pt would benefit from continued hip and core strengthening/endurance activities in order to decrease aches and pains noted in pt lumbar and thoracic spine with lifting demands at work and possibly contribute to decreasing pt recent onset of L knee pain since beginning higher intensity plyometric workouts.    Personal Factors and Comorbidities Comorbidity 3+;Profession;Time since onset of  injury/illness/exacerbation    Comorbidities PE 03/05/21, palpitations, anemia, and postpartum cardiomyopathy. Pt states that she has been cleared from her cardiologist for activity.    Examination-Activity Limitations Caring for Others;Lift;Carry    Examination-Participation Restrictions Occupation    Stability/Clinical Decision Making Evolving/Moderate complexity    Rehab Potential Good    PT Frequency 1x / week    PT Duration 6 weeks    PT Treatment/Interventions ADLs/Self Care Home Management;Biofeedback;Electrical Stimulation;Moist Heat;Traction;Ultrasound;Gait training;Stair training;Functional mobility training;Therapeutic activities;Therapeutic exercise;Balance training;Neuromuscular re-education;Manual techniques;Passive range of motion;Dry needling;Energy conservation;Taping;Joint Manipulations;Spinal Manipulations    PT Next Visit Plan Reassess soreness  from previous tx and possibly progress HEP with resisted hip abd    PT Home Exercise Plan [facility]             Patient will benefit from skilled therapeutic intervention in order to improve the following deficits and impairments:  Abnormal gait, Cardiopulmonary status limiting activity, Decreased balance, Decreased activity tolerance, Decreased coordination, Decreased endurance, Decreased mobility, Decreased strength, Hypomobility, Postural dysfunction, Improper body mechanics, Impaired perceived functional ability, Pain  Visit Diagnosis: Abnormality of gait and mobility  Difficulty in walking, not elsewhere classified  Muscle weakness (generalized)  Other abnormalities of gait and mobility  Other lack of coordination  Unsteadiness on feet     Problem List Patient Active Problem List   Diagnosis Date Noted   Back pain 08/28/2021   Diarrhea 04/14/2021   Rash 02/16/2021   Fever 01/09/2021   Arrhythmia 12/26/2020   Pain of left calf 12/26/2020   Light headedness 12/26/2020   Thyroid nodule 11/27/2020   Pulmonary  embolus, right (HCC) 11/27/2020   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 [place], [place], [address] Phone: [phone]   Fax:  [phone]  Name: Michele [name] MRN: [id number] Date of Birth: Jan 04, 1991

## 2021-10-15 ENCOUNTER — Encounter: Payer: Federal, State, Local not specified - PPO | Admitting: Physical Therapy

## 2021-10-17 ENCOUNTER — Encounter: Payer: Self-pay | Admitting: Physical Therapy

## 2021-10-17 ENCOUNTER — Other Ambulatory Visit: Payer: Self-pay

## 2021-10-17 ENCOUNTER — Ambulatory Visit: Payer: Federal, State, Local not specified - PPO | Admitting: Physical Therapy

## 2021-10-17 DIAGNOSIS — M6281 Muscle weakness (generalized): Secondary | ICD-10-CM

## 2021-10-17 DIAGNOSIS — R278 Other lack of coordination: Secondary | ICD-10-CM | POA: Diagnosis not present

## 2021-10-17 DIAGNOSIS — R262 Difficulty in walking, not elsewhere classified: Secondary | ICD-10-CM | POA: Diagnosis not present

## 2021-10-17 DIAGNOSIS — G8929 Other chronic pain: Secondary | ICD-10-CM | POA: Diagnosis not present

## 2021-10-17 DIAGNOSIS — R2681 Unsteadiness on feet: Secondary | ICD-10-CM | POA: Diagnosis not present

## 2021-10-17 DIAGNOSIS — L718 Other rosacea: Secondary | ICD-10-CM | POA: Diagnosis not present

## 2021-10-17 DIAGNOSIS — Z85828 Personal history of other malignant neoplasm of skin: Secondary | ICD-10-CM | POA: Diagnosis not present

## 2021-10-17 DIAGNOSIS — R2689 Other abnormalities of gait and mobility: Secondary | ICD-10-CM

## 2021-10-17 DIAGNOSIS — M546 Pain in thoracic spine: Secondary | ICD-10-CM | POA: Diagnosis not present

## 2021-10-17 DIAGNOSIS — R269 Unspecified abnormalities of gait and mobility: Secondary | ICD-10-CM

## 2021-10-17 DIAGNOSIS — L309 Dermatitis, unspecified: Secondary | ICD-10-CM | POA: Diagnosis not present

## 2021-10-17 DIAGNOSIS — R52 Pain, unspecified: Secondary | ICD-10-CM | POA: Diagnosis not present

## 2021-10-17 DIAGNOSIS — R279 Unspecified lack of coordination: Secondary | ICD-10-CM | POA: Diagnosis not present

## 2021-10-17 DIAGNOSIS — D225 Melanocytic nevi of trunk: Secondary | ICD-10-CM | POA: Diagnosis not present

## 2021-10-17 NOTE — Therapy (Signed)
[place] [facility] [address]. [place], [place], [address] Phone: [phone]   Fax:  [phone]  Physical Therapy Treatment  Patient Details  Name: Michele [name] MRN: [id number] Date of Birth: 06-Oct-1991 Referring Provider (PT): Dale Malaga MD   Encounter Date: 10/17/2021   PT End of Session - 10/17/21 1812     Visit Number 5    Number of Visits 6    Date for PT Re-Evaluation 10/31/21    Authorization - Visit Number 3    Authorization - Number of Visits 10    PT Start Time 1639    PT Stop Time 1730    PT Time Calculation (min) 51 min    Activity Tolerance Patient tolerated treatment well;No increased pain    Behavior During Therapy WFL for tasks assessed/performed             Past Medical History:  Diagnosis Date   History of chicken pox    Hx of migraines    Hx: UTI (urinary tract infection)    Hyperlipidemia    Palpitations     Past Surgical History:  Procedure Laterality Date   TONSILLECTOMY AND ADENOIDECTOMY  1997    There were no vitals filed for this visit.   Subjective Assessment - 10/17/21 1637     Subjective Pt states she is doing well today. She has had the day off of work. 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 Denies pain/ache in the thoracic spine. She did endorse muscular fatigue after previous session in BLE.    Pertinent History PMH: PE 03/05/21, palpitations, anemia, and postpartum cardiomyopathy. Pt states that she has been cleared from her cardiologist for activity. Pt has a 31 year old boy.    Limitations Lifting    How long can you sit comfortably? WNL    How long can you stand comfortably? WNL    How long can you walk comfortably? WNL    Diagnostic tests x-ray: Normal arthritic changes    Patient Stated Goals Reduce pain.    Currently in Pain? No/denies    Pain Onset More than a month ago              Warm up:  Step-up to 8" step, 2x20  BLE BW squats, 2x20   THEREX  Forward lunge onto BOSU with trunk rotation; 3x10 reps each LE  Quadruped: Hip extension  Bird-dog Bird-dog with 45* abduction of shoulder and hip Lift B knees (bear pose) > step back into high plank > step forward > reset.  Prone: I-T-Y with trunk extension; 1x10 each trunk extension only, 1x10 each with addition of BLE lift.  Scapular protraction/retraction in high plank position, hands on mat table.   Core: pilates 100 with hips/knees at 90/90; dead bug with PB (leg extended) held between hands and shins.   Eduction provided throughout session on modifications/progressions of each exercise above and others from previous session included in HEP.   Created and provided HEP of advanced level core strengthening and SL activities for improved glute med activation.      Assessed pt body mechanics during MOD-MAX A STS transfers (SPT role playing pt). PT educated on decreased trunk rotation during the lift, improved set up, suggested PT stand in front of pt to remain working within sagittal plane (to prevent lateral flexion and rotation of trunk). Alternate transfer method was practiced. Also emphasized PT safety is as important as pt safety and  to ask for assist when necessary (make the transfer a +2). 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        Clinical Impression: Pt arrives to PT with excellent motivation, agreeable to all exercises. Hip/core stabilization exercises were progressed. Eduction provided throughout session on modifications/progressions of each exercise above and others from previous session included in HEP. Created and provided HEP of advanced level core strengthening and SL activities for improved glute med activation. Assessed pt body mechanics during  MOD-MAX A STS transfers as pt would perform at work and provided modification for safer technique and alternative transfer/lift methods. Overall, pain has decreased significantly while pt is at work. Pt demo good understanding of exercises and carryover from previous session. Suspect pt will be ready to d/c soon. Pt would benefit from continued hip and core strengthening/endurance activities in order to decrease aches and pains noted in pt lumbar and thoracic spine with lifting demands at work and possibly contribute to decreasing pt recent onset of L knee pain since beginning higher intensity plyometric workouts.          PT Long Term Goals - 09/18/21 1026       PT LONG TERM GOAL #1   Title Pt will improve FOTO score to predicted improvement value of 82 to measure self reported functional abilites with ADLs.    Baseline 76    Time 6    Period Weeks    Status New    Target Date 10/24/21      PT LONG TERM GOAL #2   Title Pt will complete a 3 back-to-back work days with worse pain less than 3/10, for better adherence to work related activities.    Baseline 6/10    Time 6    Period Weeks    Status New    Target Date 10/24/21      PT LONG TERM GOAL #3   Title Pt will be able to lift 50# from ground to waist height without pain and proper lifting mechanics and alignment of the spine, hips, and bilat LE without cueing.    Baseline Able to life 27 # with mild knee over toes. Proper spinal and hip alignment with repetitive motions. Able to correct with verbal cueing.    Time 6    Period Weeks    Status New    Target Date 10/24/21                   Plan - 10/17/21 1813     Clinical Impression Statement Pt arrives to PT with excellent motivation, agreeable to all exercises. Hip/core stabilization exercises were progressed. Eduction provided throughout session on modifications/progressions of each exercise above and others from previous session included in HEP. Created and  provided HEP of advanced level core strengthening and SL activities for improved glute med activation. Assessed pt body mechanics during MOD-MAX A STS transfers as pt would perform at work and provided modification for safer technique and alternative transfer/lift methods. Overall, pain has decreased significantly while pt is at work. Pt demo good understanding of exercises and carryover from previous session. Suspect pt will be ready to d/c soon. Pt would benefit from continued hip and core strengthening/endurance activities in order to decrease aches and pains noted in pt lumbar and  thoracic spine with lifting demands at work and possibly contribute to decreasing pt recent onset of L knee pain since beginning higher intensity plyometric workouts.    Personal Factors and Comorbidities Comorbidity 3+;Profession;Time since onset of injury/illness/exacerbation    Comorbidities PE 03/05/21, palpitations, anemia, and postpartum cardiomyopathy. Pt states that she has been cleared from her cardiologist for activity.    Examination-Activity Limitations Caring for Others;Lift;Carry    Examination-Participation Restrictions Occupation    Stability/Clinical Decision Making Evolving/Moderate complexity    Rehab Potential Good    PT Frequency 1x / week    PT Duration 6 weeks    PT Treatment/Interventions ADLs/Self Care Home Management;Biofeedback;Electrical Stimulation;Moist Heat;Traction;Ultrasound;Gait training;Stair training;Functional mobility training;Therapeutic activities;Therapeutic exercise;Balance training;Neuromuscular re-education;Manual techniques;Passive range of motion;Dry needling;Energy conservation;Taping;Joint Manipulations;Spinal Manipulations    PT Next Visit Plan Reassess soreness from previous tx and possibly progress HEP with resisted hip abd    PT Home Exercise Plan [facility]             Patient will benefit from skilled therapeutic intervention in order to improve the following  deficits and impairments:  Abnormal gait, Cardiopulmonary status limiting activity, Decreased balance, Decreased activity tolerance, Decreased coordination, Decreased endurance, Decreased mobility, Decreased strength, Hypomobility, Postural dysfunction, Improper body mechanics, Impaired perceived functional ability, Pain  Visit Diagnosis: Abnormality of gait and mobility  Difficulty in walking, not elsewhere classified  Muscle weakness (generalized)  Other abnormalities of gait and mobility  Other lack of coordination  Unsteadiness on feet     Problem List Patient Active Problem List   Diagnosis Date Noted   Back pain 08/28/2021   Diarrhea 04/14/2021   Rash 02/16/2021   Fever 01/09/2021   Arrhythmia 12/26/2020   Pain of left calf 12/26/2020   Light headedness 12/26/2020   Thyroid nodule 11/27/2020   Pulmonary embolus, right (HCC) 11/27/2020   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 [place], [place], [address] Phone: [phone]   Fax:  [phone]  Name: Michele [name] MRN: [id number] Date of Birth: 04/19/1991

## 2021-10-22 ENCOUNTER — Encounter: Payer: Federal, State, Local not specified - PPO | Admitting: Physical Therapy

## 2021-10-24 ENCOUNTER — Other Ambulatory Visit: Payer: Self-pay

## 2021-10-24 ENCOUNTER — Ambulatory Visit: Payer: Federal, State, Local not specified - PPO | Attending: Internal Medicine

## 2021-10-24 ENCOUNTER — Encounter: Payer: Self-pay | Admitting: Physical Therapy

## 2021-10-24 DIAGNOSIS — R52 Pain, unspecified: Secondary | ICD-10-CM | POA: Diagnosis not present

## 2021-10-24 DIAGNOSIS — R278 Other lack of coordination: Secondary | ICD-10-CM | POA: Diagnosis not present

## 2021-10-24 DIAGNOSIS — R262 Difficulty in walking, not elsewhere classified: Secondary | ICD-10-CM | POA: Insufficient documentation

## 2021-10-24 DIAGNOSIS — R279 Unspecified lack of coordination: Secondary | ICD-10-CM | POA: Insufficient documentation

## 2021-10-24 DIAGNOSIS — R269 Unspecified abnormalities of gait and mobility: Secondary | ICD-10-CM | POA: Insufficient documentation

## 2021-10-24 DIAGNOSIS — M6281 Muscle weakness (generalized): Secondary | ICD-10-CM | POA: Insufficient documentation

## 2021-10-24 DIAGNOSIS — R2681 Unsteadiness on feet: Secondary | ICD-10-CM | POA: Insufficient documentation

## 2021-10-24 DIAGNOSIS — G8929 Other chronic pain: Secondary | ICD-10-CM | POA: Diagnosis not present

## 2021-10-24 DIAGNOSIS — R2689 Other abnormalities of gait and mobility: Secondary | ICD-10-CM | POA: Diagnosis not present

## 2021-10-24 DIAGNOSIS — M546 Pain in thoracic spine: Secondary | ICD-10-CM | POA: Diagnosis not present

## 2021-10-24 NOTE — Therapy (Signed)
[place] [facility] [address]. [place], [place], [address] Phone: [phone]   Fax:  [phone]  Physical Therapy Treatment  Patient Details  Name: Michele [name] MRN: [id number] Date of Birth: 1991-09-13 Referring Provider (PT): Einar Pheasant MD   Encounter Date: 10/24/2021   PT End of Session - 10/24/21 1612     Visit Number 6    Number of Visits 10    Date for PT Re-Evaluation 11/21/21    Authorization - Visit Number 3    Authorization - Number of Visits 10    PT Start Time 0029    PT Stop Time 8473    PT Time Calculation (min) 42 min    Activity Tolerance Patient tolerated treatment well;No increased pain    Behavior During Therapy WFL for tasks assessed/performed             Past Medical History:  Diagnosis Date   History of chicken pox    Hx of migraines    Hx: UTI (urinary tract infection)    Hyperlipidemia    Palpitations     Past Surgical History:  Procedure Laterality Date   TONSILLECTOMY AND ADENOIDECTOMY  1997    There were no vitals filed for this visit.   Subjective Assessment - 10/24/21 1609     Subjective Pt reports 2/10 NPS. Reports flare up over the weekend and requesitng additional visits. Was 4/10 with flare up at work.    Pertinent History PMH: PE 03/05/21, palpitations, anemia, and postpartum cardiomyopathy. Pt states that she has been cleared from her cardiologist for activity. Pt has a 31 year old boy.    Limitations Lifting    How long can you sit comfortably? WNL    How long can you stand comfortably? WNL    How long can you walk comfortably? WNL    Diagnostic tests x-ray: Normal arthritic changes    Patient Stated Goals Reduce pain.    Currently in Pain? Yes    Pain Score 2     Pain Location Back    Pain Orientation Right;Left;Mid    Pain Descriptors / Indicators Aching    Pain Onset More than a month ago    Pain Frequency Intermittent             There.ex:    Reassessment of goals:    FOTO: 96   Worst pain at work with 3 consecutive work days: 4/10 NPS    Lifting mechanics with 50# box to prevent future lifting injury. No noted knees anterior to toes. Good hip hinge and neutral thoracic posture lifting with LE's.   Quadruped exercises:  Hip extension with knee extended: 2x10 bilat  Bird-dog 2x10 bilat  Intermittent cuing needed for reducing thoracic rotation as compensation. Use of cone as tactile cue on sacrum to maintain neutral pelvis/spine  Plank time on elbows: tolerated 1 min total but at 30 sec, noted intermittent loss of form with hips dipping towards mat table and visible shaking in UE's/LE's:       Manual:  Pt in prone for 10 min   IASTM with hypervolt to Low back paraspinals L1-S1 bilat for reduction of muscular tension and pain relief      PT Education - 10/24/21 1611     Education Details form/technique with exercise. POC. Progress towards goals.    Person(s) Educated Patient    Methods Explanation;Demonstration;Tactile cues;Verbal cues    Comprehension Verbalized understanding;Returned demonstration  PT Long Term Goals - 10/24/21 1612       PT LONG TERM GOAL #1   Title Pt will improve FOTO score to predicted improvement value of 82 to measure self reported functional abilites with ADLs.    Baseline 76; 11/3: 94 with target expected of 76    Time 6    Period Weeks    Status Achieved    Target Date 10/24/21      PT LONG TERM GOAL #2   Title Pt will complete a 3 back-to-back work days with worse pain less than 3/10, for better adherence to work related activities.    Baseline 6/10; 11/3: prior to this past saturday, reports able to do with no pain. Past weekend, 4/10    Time 6    Period Weeks    Status Partially Met    Target Date 11/21/21      PT LONG TERM GOAL #3   Title Pt will be able to lift 50# from ground to waist height without pain and proper lifting mechanics and alignment  of the spine, hips, and bilat LE without cueing.    Baseline Able to life 27 # with mild knee over toes. Proper spinal and hip alignment with repetitive motions. Able to correct with verbal cueing.; 11/3: able to complete 50# with keeping hip hinge, upright torso and no pain.    Time 6    Period Weeks    Status Achieved    Target Date 10/24/21      PT LONG TERM GOAL #4   Title Pt will hold plank on elbows for 1 min with no visible shakiness or loss of form to indicate improvement in core strength to prevent future low back injury    Baseline 11/3: 30 sec with dipping of hips and shakiness in Ue's    Time 4    Period Weeks    Status New    Target Date 11/21/21                   Plan - 10/24/21 1651     Clinical Impression Statement Pt returning to PT on last approved visit for PT POC. Reassessment of goals today with achievement in 2/3. Still remains having partial set back in pain after mild exacerbation over the weekend at work. New goal written for objective for core strength with isometric plank. Pt responding well with improved form/technique with lifting mechanics but still remains with weak core and will benefit from additional 4 weeks of PT in attemtps to progress core strength to assist in further prevention of future episodes of LBP or injury.    Personal Factors and Comorbidities Comorbidity 3+;Profession;Time since onset of injury/illness/exacerbation    Comorbidities PE 03/05/21, palpitations, anemia, and postpartum cardiomyopathy. Pt states that she has been cleared from her cardiologist for activity.    Examination-Activity Limitations Caring for Others;Lift;Carry    Examination-Participation Restrictions Occupation    Stability/Clinical Decision Making Evolving/Moderate complexity    Clinical Decision Making Moderate    Rehab Potential Good    PT Frequency 1x / week    PT Duration 4 weeks    PT Treatment/Interventions ADLs/Self Care Home  Management;Biofeedback;Electrical Stimulation;Moist Heat;Traction;Ultrasound;Gait training;Stair training;Functional mobility training;Therapeutic activities;Therapeutic exercise;Balance training;Neuromuscular re-education;Manual techniques;Passive range of motion;Dry needling;Energy conservation;Taping;Joint Manipulations;Spinal Manipulations    PT Next Visit Plan Progress core strength    PT Home Exercise Plan [facility]    Consulted and Agree with Plan of Care Patient  Patient will benefit from skilled therapeutic intervention in order to improve the following deficits and impairments:  Abnormal gait, Cardiopulmonary status limiting activity, Decreased balance, Decreased activity tolerance, Decreased coordination, Decreased endurance, Decreased mobility, Decreased strength, Hypomobility, Postural dysfunction, Improper body mechanics, Impaired perceived functional ability, Pain  Visit Diagnosis: Abnormality of gait and mobility  Difficulty in walking, not elsewhere classified  Muscle weakness (generalized)  Other abnormalities of gait and mobility  Chronic right-sided thoracic back pain     Problem List Patient Active Problem List   Diagnosis Date Noted   Back pain 08/28/2021   Diarrhea 04/14/2021   Rash 02/16/2021   Fever 01/09/2021   Arrhythmia 12/26/2020   Pain of left calf 12/26/2020   Light headedness 12/26/2020   Thyroid nodule 11/27/2020   Pulmonary embolus, right ([place]) 11/27/2020   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 Fairly IV, PT, DPT Physical Therapist- [facility]  10/24/2021, 4:59 PM  [facility] [address] [place], [place], [address] Phone: [phone]   Fax:  [phone]  Name: Michele [name] MRN: [id number] Date of Birth: 31-May-1991

## 2021-10-29 ENCOUNTER — Encounter: Payer: Federal, State, Local not specified - PPO | Admitting: Physical Therapy

## 2021-10-31 ENCOUNTER — Encounter: Payer: Federal, State, Local not specified - PPO | Admitting: Physical Therapy

## 2021-10-31 ENCOUNTER — Encounter: Payer: Self-pay | Admitting: Physical Therapy

## 2021-10-31 ENCOUNTER — Ambulatory Visit: Payer: Federal, State, Local not specified - PPO | Admitting: Physical Therapy

## 2021-10-31 ENCOUNTER — Other Ambulatory Visit: Payer: Self-pay

## 2021-10-31 DIAGNOSIS — G8929 Other chronic pain: Secondary | ICD-10-CM

## 2021-10-31 DIAGNOSIS — R278 Other lack of coordination: Secondary | ICD-10-CM

## 2021-10-31 DIAGNOSIS — R2681 Unsteadiness on feet: Secondary | ICD-10-CM

## 2021-10-31 DIAGNOSIS — R269 Unspecified abnormalities of gait and mobility: Secondary | ICD-10-CM | POA: Diagnosis not present

## 2021-10-31 DIAGNOSIS — R2689 Other abnormalities of gait and mobility: Secondary | ICD-10-CM | POA: Diagnosis not present

## 2021-10-31 DIAGNOSIS — R279 Unspecified lack of coordination: Secondary | ICD-10-CM | POA: Diagnosis not present

## 2021-10-31 DIAGNOSIS — M6281 Muscle weakness (generalized): Secondary | ICD-10-CM

## 2021-10-31 DIAGNOSIS — R262 Difficulty in walking, not elsewhere classified: Secondary | ICD-10-CM

## 2021-10-31 DIAGNOSIS — M546 Pain in thoracic spine: Secondary | ICD-10-CM | POA: Diagnosis not present

## 2021-10-31 DIAGNOSIS — R52 Pain, unspecified: Secondary | ICD-10-CM | POA: Diagnosis not present

## 2021-10-31 NOTE — Therapy (Signed)
[facility] [address] [place], [place], [address] Phone: [phone]   Fax:  [phone]  Physical Therapy Treatment  Patient Details  Name: Michele [name] MRN: [id number] Date of Birth: [birth year] Referring Provider (PT): Einar Pheasant MD   Encounter Date: 10/31/2021   PT End of Session - 10/31/21 1631     Visit Number 7    Number of Visits 10    Date for PT Re-Evaluation 11/21/21    Authorization - Visit Number 7    Authorization - Number of Visits 10    PT Start Time 5631    PT Stop Time 1708    PT Time Calculation (min) 45 min    Activity Tolerance Patient tolerated treatment well;No increased pain    Behavior During Therapy WFL for tasks assessed/performed             Past Medical History:  Diagnosis Date   History of chicken pox    Hx of migraines    Hx: UTI (urinary tract infection)    Hyperlipidemia    Palpitations     Past Surgical History:  Procedure Laterality Date   TONSILLECTOMY AND ADENOIDECTOMY  1997    There were no vitals filed for this visit.   Subjective Assessment - 10/31/21 1624     Subjective Pt presents to tx with 0/10 LBP and no further flare ups from last tx. Pt c/c at this time is forward flexion during a flare up. Pt state no further functional issues when pt is not in a back pain flare up. Pt is agreeable to possible d/c next week 11/07/2021.    Pertinent History PMH: PE 03/05/21, palpitations, anemia, and postpartum cardiomyopathy. Pt states that she has been cleared from her cardiologist for activity. Pt has a 31 year old boy.    Limitations Lifting    How long can you sit comfortably? WNL    How long can you stand comfortably? WNL    How long can you walk comfortably? WNL    Diagnostic tests x-ray: Normal arthritic changes    Patient Stated Goals Reduce pain.    Currently in Pain? No/denies    Pain Score 0-No pain    Pain Onset More than a month ago               THER.EX:  NuStep L 4 without UE assist and verbal cueing to ensure movement without increase in back pain and proper LE alignment.    Blue mat table exercise with verbal and contact cueing to facilitate optimal tissue loading and correct biomechanical alignment to ensure efficient and safe movement.  1) TrA with towel for feedback with 10 depth breaths.  2) TrA with towel for feedback with alternating hip marching 10 each leg. 3) TrA with towel for feedback with dead bug with exercise ball 2x10 each leg  4) Prone extension on forearms 30 seconds x 3 5) Prone forearm plant 20 sec x3  6) Bird dog 10 each side x 3.    TRX lunges x12 each LE. Verbal and contact cueing to facilitate optimal tissue loading and correct biomechanical alignment to ensure efficient and safe movement.    Squat with over head wood dowel to optimal posture alignment. Verbal and contact cueing to facilitate optimal tissue loading and correct biomechanical alignment to ensure efficient and safe movement. 2x15           PT Long Term Goals - 10/24/21 1612  PT LONG TERM GOAL #1   Title Pt will improve FOTO score to predicted improvement value of 82 to measure self reported functional abilites with ADLs.    Baseline 76; 11/3: 94 with target expected of 76    Time 6    Period Weeks    Status Achieved    Target Date 10/24/21      PT LONG TERM GOAL #2   Title Pt will complete a 3 back-to-back work days with worse pain less than 3/10, for better adherence to work related activities.    Baseline 6/10; 11/3: prior to this past saturday, reports able to do with no pain. Past weekend, 4/10    Time 6    Period Weeks    Status Partially Met    Target Date 11/21/21      PT LONG TERM GOAL #3   Title Pt will be able to lift 50# from ground to waist height without pain and proper lifting mechanics and alignment of the spine, hips, and bilat LE without cueing.    Baseline Able to life 27 # with mild knee  over toes. Proper spinal and hip alignment with repetitive motions. Able to correct with verbal cueing.; 11/3: able to complete 50# with keeping hip hinge, upright torso and no pain.    Time 6    Period Weeks    Status Achieved    Target Date 10/24/21      PT LONG TERM GOAL #4   Title Pt will hold plank on elbows for 1 min with no visible shakiness or loss of form to indicate improvement in core strength to prevent future low back injury    Baseline 11/3: 30 sec with dipping of hips and shakiness in Ue's    Time 4    Period Weeks    Status New    Target Date 11/21/21                   Plan - 10/31/21 1631     Clinical Impression Statement Today's tx was focused on progression of core stability during dynamic mobility. Pt completed all tx without increase in pain. Pt is set to d/c next week unless another flare up is noted. Pt continues to display mark core endurance, strength, and ROM deficits resulting in decreased safe home/community mobiity, increased pain, and limited access to QOL. Pt. will continue to benefit from skilled physical therapy to progress POC to address remaining deficits to facilitate maximum functional capacity for optimal personal health and wellness for ADLs.    Personal Factors and Comorbidities Comorbidity 3+;Profession;Time since onset of injury/illness/exacerbation    Comorbidities PE 03/05/21, palpitations, anemia, and postpartum cardiomyopathy. Pt states that she has been cleared from her cardiologist for activity.    Examination-Activity Limitations Caring for Others;Lift;Carry    Examination-Participation Restrictions Occupation    Stability/Clinical Decision Making Evolving/Moderate complexity    Clinical Decision Making Moderate    Rehab Potential Good    PT Frequency 1x / week    PT Duration 4 weeks    PT Treatment/Interventions ADLs/Self Care Home Management;Biofeedback;Electrical Stimulation;Moist Heat;Traction;Ultrasound;Gait training;Stair  training;Functional mobility training;Therapeutic activities;Therapeutic exercise;Balance training;Neuromuscular re-education;Manual techniques;Passive range of motion;Dry needling;Energy conservation;Taping;Joint Manipulations;Spinal Manipulations    PT Next Visit Plan possible d/c    PT Home Exercise Plan [facility]    Consulted and Agree with Plan of Care Patient             Patient will benefit from skilled therapeutic intervention in order to  improve the following deficits and impairments:  Abnormal gait, Cardiopulmonary status limiting activity, Decreased balance, Decreased activity tolerance, Decreased coordination, Decreased endurance, Decreased mobility, Decreased strength, Hypomobility, Postural dysfunction, Improper body mechanics, Impaired perceived functional ability, Pain  Visit Diagnosis: Abnormality of gait and mobility  Chronic right-sided thoracic back pain  Other lack of coordination  Difficulty in walking, not elsewhere classified  Unsteadiness on feet  Muscle weakness (generalized)     Problem List Patient Active Problem List   Diagnosis Date Noted   Back pain 08/28/2021   Diarrhea 04/14/2021   Rash 02/16/2021   Fever 01/09/2021   Arrhythmia 12/26/2020   Pain of left calf 12/26/2020   Light headedness 12/26/2020   Thyroid nodule 11/27/2020   Pulmonary embolus, right ([place]) 11/27/2020   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 [place], [place], [address] Phone: [phone]   Fax:  [phone]  Name: Michele [name] MRN: [id number] Date of Birth: 02-11-1991

## 2021-11-05 ENCOUNTER — Encounter: Payer: Federal, State, Local not specified - PPO | Admitting: Physical Therapy

## 2021-11-07 ENCOUNTER — Other Ambulatory Visit: Payer: Self-pay

## 2021-11-07 ENCOUNTER — Encounter: Payer: Federal, State, Local not specified - PPO | Admitting: Physical Therapy

## 2021-11-07 ENCOUNTER — Ambulatory Visit: Payer: Federal, State, Local not specified - PPO | Admitting: Physical Therapy

## 2021-11-07 ENCOUNTER — Encounter: Payer: Self-pay | Admitting: Physical Therapy

## 2021-11-07 DIAGNOSIS — M546 Pain in thoracic spine: Secondary | ICD-10-CM | POA: Diagnosis not present

## 2021-11-07 DIAGNOSIS — R278 Other lack of coordination: Secondary | ICD-10-CM | POA: Diagnosis not present

## 2021-11-07 DIAGNOSIS — R2681 Unsteadiness on feet: Secondary | ICD-10-CM

## 2021-11-07 DIAGNOSIS — R2689 Other abnormalities of gait and mobility: Secondary | ICD-10-CM

## 2021-11-07 DIAGNOSIS — R279 Unspecified lack of coordination: Secondary | ICD-10-CM

## 2021-11-07 DIAGNOSIS — R52 Pain, unspecified: Secondary | ICD-10-CM

## 2021-11-07 DIAGNOSIS — M6281 Muscle weakness (generalized): Secondary | ICD-10-CM

## 2021-11-07 DIAGNOSIS — G8929 Other chronic pain: Secondary | ICD-10-CM | POA: Diagnosis not present

## 2021-11-07 DIAGNOSIS — R269 Unspecified abnormalities of gait and mobility: Secondary | ICD-10-CM

## 2021-11-07 DIAGNOSIS — R262 Difficulty in walking, not elsewhere classified: Secondary | ICD-10-CM | POA: Diagnosis not present

## 2021-11-07 NOTE — Patient Instructions (Signed)
Access Code: [facility] URL: [email or web address] Date: 11/07/2021 Prepared by: Dorene Grebe  Exercises  Supine Posterior Pelvic Tilt - 1 x daily - 7 x weekly - 30 reps - 3 sec hold hold Supine Bridge - 1 x daily - 7 x weekly - 30 reps - 3 sec hold hold Supine Lower Trunk Rotation - 1 x daily - 7 x weekly - 30 reps Supine Double Knee to Chest - 1 x daily - 7 x weekly - 30 reps - 5 sec hold hold Sidelying Hip Abduction - 1 x daily - 7 x weekly - 30 reps Prone Press Up On Elbows - 1 x daily - 7 x weekly - 3 reps - 15 hold Standard Plank - 1 x daily - 7 x weekly - 3 sets - 3 reps - 30 hold Side Plank on Elbow - 1 x daily - 7 x weekly - 3 reps - 30 hold

## 2021-11-07 NOTE — Therapy (Signed)
[place] [facility] [address] [place], [place], [address] Phone: [phone]   Fax:  [phone]  Physical Therapy Treatment and Discharge summery 09/18/2021-11/07/2021  Patient Details  Name: Michele [name] MRN: [id number] Date of Birth: 1991/05/01 Referring Provider (PT): Einar Pheasant MD   Encounter Date: 11/07/2021   PT End of Session - 11/07/21 1630     Visit Number 8    Number of Visits 10    Date for PT Re-Evaluation 11/21/21    Authorization - Visit Number 8    Authorization - Number of Visits 10    PT Start Time 9371    PT Stop Time 6967    PT Time Calculation (min) 29 min    Activity Tolerance Patient tolerated treatment well;No increased pain    Behavior During Therapy WFL for tasks assessed/performed             Past Medical History:  Diagnosis Date   History of chicken pox    Hx of migraines    Hx: UTI (urinary tract infection)    Hyperlipidemia    Palpitations     Past Surgical History:  Procedure Laterality Date   TONSILLECTOMY AND ADENOIDECTOMY  1997    There were no vitals filed for this visit.   Subjective Assessment - 11/07/21 1644     Subjective Pt presents to tx with 0/10 LBP. Pt states 100% return to PLOF and good understanding on correct HEP and physicla activity adherence. Pt is agreeable and excited to d/c today 11/07/2021    Pertinent History PMH: PE 03/05/21, palpitations, anemia, and postpartum cardiomyopathy. Pt states that she has been cleared from her cardiologist for activity. Pt has a 31 year old boy.    Limitations Lifting    How long can you sit comfortably? WNL    How long can you stand comfortably? WNL    How long can you walk comfortably? WNL    Diagnostic tests x-ray: Normal arthritic changes    Patient Stated Goals Reduce pain.    Currently in Pain? No/denies    Pain Score 0-No pain    Pain Onset More than a month ago                Reasessmen for D/c and  updated HEP.     FOTO: 94   Worse pain during work related tasks: 0/10 during all work related tasks  Functional lifting: 50# lift x10 with proper tech and no pain   Plank: only minor shakiness was observed without dipping of hips      Access Code: [facility] URL: [email or web address] Date: 11/07/2021 Prepared by: Dorcas Carrow  Exercises   Supine Posterior Pelvic Tilt - 1 x daily - 7 x weekly - 30 reps - 3 sec hold hold Supine Bridge - 1 x daily - 7 x weekly - 30 reps - 3 sec hold hold Supine Lower Trunk Rotation - 1 x daily - 7 x weekly - 30 reps Supine Double Knee to Chest - 1 x daily - 7 x weekly - 30 reps - 5 sec hold hold Sidelying Hip Abduction - 1 x daily - 7 x weekly - 30 reps Prone Press Up On Elbows - 1 x daily - 7 x weekly - 3 reps - 15 hold Standard Plank - 1 x daily - 7 x weekly - 3 sets - 3 reps - 30 hold Side Plank on Elbow - 1 x daily - 7 x weekly - 3  reps - 30 hold          PT Education - 11/07/21 1646     Education Details Pt was educated on updated HEP, progression of core stability exercise, and prognosis at d/c    Person(s) Educated Patient    Methods Explanation    Comprehension Verbalized understanding                 PT Long Term Goals - 11/07/21 1630       PT LONG TERM GOAL #1   Title Pt will improve FOTO score to predicted improvement value of 82 to measure self reported functional abilites with ADLs.    Baseline 76; 11/3: 94 with target expected of 76 11/17: 94 achieved again.    Time 6    Period Weeks    Status Achieved    Target Date 11/07/21      PT LONG TERM GOAL #2   Title Pt will complete a 3 back-to-back work days with worse pain less than 3/10, for better adherence to work related activities.    Baseline 6/10; 11/3: prior to this past saturday, reports able to do with no pain. Past weekend, 4/10. 11/17: 0/10 during all work related tasks    Time 6    Period Weeks    Status Achieved    Target  Date 11/07/21      PT LONG TERM GOAL #3   Title Pt will be able to lift 50# from ground to waist height without pain and proper lifting mechanics and alignment of the spine, hips, and bilat LE without cueing.    Baseline Able to life 27 # with mild knee over toes. Proper spinal and hip alignment with repetitive motions. Able to correct with verbal cueing.; 11/3: able to complete 50# with keeping hip hinge, upright torso and no pain. 11/17: 50# lift x10 with proper tech and no pain    Time 6    Period Weeks    Status Achieved    Target Date 10/24/21      PT LONG TERM GOAL #4   Title Pt will hold plank on elbows for 1 min with no visible shakiness or loss of form to indicate improvement in core strength to prevent future low back injury    Baseline 11/3: 30 sec with dipping of hips and shakiness in Ue's 11/17: only minor shakiness was observed without dipping of hips    Time 4    Period Weeks    Status Achieved    Target Date 11/07/21                   Plan - 11/07/21 1647     Clinical Impression Statement Pt was reassessed on this date 11/07/2021. PT reassessment displayed the following findings: FOTO: 94. Worse pain during work related tasks: 0/10 during all work related tasks. Functional lifting: 50# lift x10 with proper tech and no pain. Plank: only minor shakiness was observed without dipping of hips. Pt was d/c with  excellent prognosis for maintenance of full PLOF. Pt was educated on new HEP with technique and form. Pt was also educated on return to PT if functional mobility displays dramatic regression. Pt met all goals and was agreeable and excited for d/c.    Personal Factors and Comorbidities Comorbidity 3+;Profession;Time since onset of injury/illness/exacerbation    Comorbidities PE 03/05/21, palpitations, anemia, and postpartum cardiomyopathy. Pt states that she has been cleared from her cardiologist for activity.    Examination-Activity  Limitations Caring for  Others;Lift;Carry    Examination-Participation Restrictions Occupation    Stability/Clinical Decision Making Evolving/Moderate complexity    Clinical Decision Making Moderate    Rehab Potential Good    PT Frequency 1x / week    PT Duration 4 weeks    PT Treatment/Interventions ADLs/Self Care Home Management;Biofeedback;Electrical Stimulation;Moist Heat;Traction;Ultrasound;Gait training;Stair training;Functional mobility training;Therapeutic activities;Therapeutic exercise;Balance training;Neuromuscular re-education;Manual techniques;Passive range of motion;Dry needling;Energy conservation;Taping;Joint Manipulations;Spinal Manipulations    PT Next Visit Plan pt was d/c with updated HEP.    PT Home Exercise Plan [facility]    Consulted and Agree with Plan of Care Patient             Patient will benefit from skilled therapeutic intervention in order to improve the following deficits and impairments:  Abnormal gait, Cardiopulmonary status limiting activity, Decreased balance, Decreased activity tolerance, Decreased coordination, Decreased endurance, Decreased mobility, Decreased strength, Hypomobility, Postural dysfunction, Improper body mechanics, Impaired perceived functional ability, Pain  Visit Diagnosis: Abnormality of gait and mobility  Unsteadiness on feet  Pain aggravated by lifting  Muscle weakness (generalized)  Chronic right-sided thoracic back pain  Other lack of coordination  Other abnormalities of gait and mobility  Unspecified lack of coordination  Difficulty in walking, not elsewhere classified     Problem List Patient Active Problem List   Diagnosis Date Noted   Back pain 08/28/2021   Diarrhea 04/14/2021   Rash 02/16/2021   Fever 01/09/2021   Arrhythmia 12/26/2020   Pain of left calf 12/26/2020   Light headedness 12/26/2020   Thyroid nodule 11/27/2020   Pulmonary embolus, right ([place]) 11/27/2020   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 [address] [place], [place], [address] Phone: [phone]   Fax:  [phone]  Name: Michele [name] MRN: [id number] Date of Birth: 02-13-1991

## 2021-11-19 DIAGNOSIS — E041 Nontoxic single thyroid nodule: Secondary | ICD-10-CM | POA: Diagnosis not present

## 2021-11-21 ENCOUNTER — Encounter: Payer: Federal, State, Local not specified - PPO | Admitting: Physical Therapy

## 2021-11-27 DIAGNOSIS — E041 Nontoxic single thyroid nodule: Secondary | ICD-10-CM | POA: Diagnosis not present

## 2021-11-28 ENCOUNTER — Encounter: Payer: Federal, State, Local not specified - PPO | Admitting: Physical Therapy

## 2021-12-01 ENCOUNTER — Telehealth: Payer: Federal, State, Local not specified - PPO | Admitting: Nurse Practitioner

## 2021-12-01 DIAGNOSIS — U071 COVID-19: Secondary | ICD-10-CM

## 2021-12-01 DIAGNOSIS — Z20822 Contact with and (suspected) exposure to covid-19: Secondary | ICD-10-CM | POA: Diagnosis not present

## 2021-12-01 MED ORDER — NIRMATRELVIR/RITONAVIR (PAXLOVID)TABLET: 3.0000 | ORAL_TABLET | Freq: Two times a day (BID) | ORAL | 0 refills | Status: AC

## 2021-12-01 MED ORDER — FLUTICASONE PROPIONATE 50 MCG/ACT NA SUSP: 2.0000 | Freq: Every day | NASAL | 0 refills | Status: DC

## 2021-12-01 NOTE — Progress Notes (Signed)
Virtual Visit Consent   Michele [name], you are scheduled for a virtual visit with a [facility] provider today.     Just as with appointments in the office, your consent must be obtained to participate.  Your consent will be active for this visit and any virtual visit you may have with one of our providers in the next 365 days.     If you have a MyChart account, a copy of this consent can be sent to you electronically.  All virtual visits are billed to your insurance company just like a traditional visit in the office.    As this is a virtual visit, video technology does not allow for your provider to perform a traditional examination.  This may limit your provider's ability to fully assess your condition.  If your provider identifies any concerns that need to be evaluated in person or the need to arrange testing (such as labs, EKG, etc.), we will make arrangements to do so.     Although advances in technology are sophisticated, we cannot ensure that it will always work on either your end or our end.  If the connection with a video visit is poor, the visit may have to be switched to a telephone visit.  With either a video or telephone visit, we are not always able to ensure that we have a secure connection.     I need to obtain your verbal consent now.   Are you willing to proceed with your visit today? Yes   Michele [name] has provided verbal consent on 12/01/2021 for a virtual visit (video or telephone).   Michele Ba, NP   Date: 12/01/2021 11:11 AM   Virtual Visit via Video Note   I, Michele [name], connected with  Michele [name]  ([id number], July 23, 1991) on 12/01/21 at 11:15 AM EST by a video-enabled telemedicine application and verified that I am speaking with the correct person using two identifiers.  Location: Patient: Virtual Visit Location Patient: Home Provider: Virtual Visit Location Provider: Home   I discussed the limitations of evaluation and management  by telemedicine and the availability of in person appointments. The patient expressed understanding and agreed to proceed.    History of Present Illness: Michele [name] is a 30 y.o. who identifies as a female who was assigned female at birth, and is being seen today for positive COVID test. Symptoms started on 11/28/21. She complains of nasal congestion, fever- 100, non-productive cough, bodyaches and fatigue. She received 2 COVID vaccines and a booster. She works in a long-term care facility. She has been taking Mucinex for her symptoms. Informs she has a history of heart failure, had an ablation d/t arrhythmias. Currently takes Metoprolol. She is waiting on PCR results.  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   Anemia 11/26/2020   Rectal bleeding 02/08/2020   Increased thirst 07/17/2019   Hair loss 10/12/2018   Menstrual changes 10/12/2018   Low back pain 05/04/2016   Encounter for completion of form with patient 07/31/2015   Health care maintenance 01/31/2015   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   Sinusitis 09/01/2013   Palpitations 07/15/2013   Hx of  migraines 07/15/2013   Hypercholesterolemia 07/15/2013   UTI (urinary tract infection) 07/15/2013    Allergies: No Known Allergies Medications:  Current Outpatient Medications:    acetaminophen (TYLENOL) 500 MG tablet, Take by mouth., Disp: , Rfl:    cephALEXin (KEFLEX) 500 MG capsule, Take 1 capsule (500 mg total) by mouth 2 (two) times daily., Disp: 14 capsule, Rfl: 0   Cholecalciferol 25 MCG (1000 UT) capsule, Take 1,000 Units by mouth daily., Disp: , Rfl:    Magnesium 500 MG TABS, Take 1,000 mg by mouth daily., Disp: ,  Rfl:    MELATONIN PO, Take by mouth as needed (for sleep)., Disp: , Rfl:    Omega-3 Fatty Acids (FISH OIL) 1000 MG CAPS, Take 1 capsule by mouth daily., Disp: , Rfl:    omeprazole (PRILOSEC) 20 MG capsule, Take 20 mg by mouth as needed., Disp: , Rfl: 1   Prenatal Vit-Fe Fumarate-FA (PRENATAL PO), Take 1 capsule by mouth daily., Disp: , Rfl:   Observations/Objective: Patient is well-developed, well-nourished in no acute distress.  Resting comfortably at home.  Head is normocephalic, atraumatic.  No labored breathing.  Speech is clear and coherent with logical content.  Patient is alert and oriented at baseline.    Assessment and Plan: 1. COVID-19 - nirmatrelvir/ritonavir EUA (PAXLOVID) 20 x 150 MG & 10 x 100MG  TABS; Take 3 tablets by mouth 2 (two) times daily for 5 days. (Take nirmatrelvir 150 mg two tablets twice daily for 5 days and ritonavir 100 mg one tablet twice daily for 5 days) Patient GFR is .79  Dispense: 30 tablet; Refill: 0 - fluticasone (FLONASE) 50 MCG/ACT nasal spray; Place 2 sprays into both nostrils daily for 14 days.  Dispense: 16 g; Refill: 0  Will start Paxlovid with history of HF. Continue Mucinex at this time. Also prescribed Flonase. Increase rest, get plenty of fluids. Follow-up in the ER if SOB, difficulty breathing or other concerns. Note provided for work. Remain in isolation until symptoms resolve, notify close contacts.   Follow Up Instructions: I discussed the assessment and treatment plan with the patient. The patient was provided an opportunity to ask questions and all were answered. The patient agreed with the plan and demonstrated an understanding of the instructions.  A copy of instructions were sent to the patient via MyChart unless otherwise noted below.   The patient was advised to call back or seek an in-person evaluation if the symptoms worsen or if the condition fails to improve as anticipated.  Time:  I spent 15 minutes with the patient via  telehealth technology discussing the above problems/concerns.    , NP

## 2021-12-01 NOTE — Patient Instructions (Addendum)
[name], thank you for joining Nicoletta Ba, NP for today's virtual visit.  While this provider is not your primary care provider (PCP), if your PCP is located in our provider database this encounter information will be shared with them immediately following your visit.  Consent: (Patient) Michele [name] provided verbal consent for this virtual visit at the beginning of the encounter.  Current Medications:  Current Outpatient Medications:    acetaminophen (TYLENOL) 500 MG tablet, Take by mouth., Disp: , Rfl:    Cholecalciferol 25 MCG (1000 UT) capsule, Take 1,000 Units by mouth daily., Disp: , Rfl:    fluticasone (FLONASE) 50 MCG/ACT nasal spray, Place 2 sprays into both nostrils daily for 14 days., Disp: 16 g, Rfl: 0   Magnesium 500 MG TABS, Take 1,000 mg by mouth daily., Disp: , Rfl:    metoprolol succinate (TOPROL-XL) 50 MG 24 hr tablet, Take 50 mg by mouth daily. Take with or immediately following a meal., Disp: , Rfl:    nirmatrelvir/ritonavir EUA (PAXLOVID) 20 x 150 MG & 10 x 100MG  TABS, Take 3 tablets by mouth 2 (two) times daily for 5 days. (Take nirmatrelvir 150 mg two tablets twice daily for 5 days and ritonavir 100 mg one tablet twice daily for 5 days) Patient GFR is .79, Disp: 30 tablet, Rfl: 0   Omega-3 Fatty Acids (FISH OIL) 1000 MG CAPS, Take 1 capsule by mouth daily., Disp: , Rfl:    omeprazole (PRILOSEC) 20 MG capsule, Take 20 mg by mouth as needed., Disp: , Rfl: 1   Prenatal Vit-Fe Fumarate-FA (PRENATAL PO), Take 1 capsule by mouth daily., Disp: , Rfl:    cephALEXin (KEFLEX) 500 MG capsule, Take 1 capsule (500 mg total) by mouth 2 (two) times daily., Disp: 14 capsule, Rfl: 0   MELATONIN PO, Take by mouth as needed (for sleep). (Patient not taking: Reported on 12/01/2021), Disp: , Rfl:    Medications ordered in this encounter:  Meds ordered this encounter  Medications   nirmatrelvir/ritonavir EUA (PAXLOVID) 20 x 150 MG & 10 x 100MG  TABS    Sig: Take 3  tablets by mouth 2 (two) times daily for 5 days. (Take nirmatrelvir 150 mg two tablets twice daily for 5 days and ritonavir 100 mg one tablet twice daily for 5 days) Patient GFR is .79    Dispense:  30 tablet    Refill:  0   fluticasone (FLONASE) 50 MCG/ACT nasal spray    Sig: Place 2 sprays into both nostrils daily for 14 days.    Dispense:  16 g    Refill:  0     *If you need refills on other medications prior to your next appointment, please contact your pharmacy*  Follow-Up: Call back or seek an in-person evaluation if the symptoms worsen or if the condition fails to improve as anticipated.  Other Instructions covWill start Paxlovid with history of HF. Continue Mucinex at this time. Also prescribed Flonase. Increase rest, get plenty of fluids. Follow-up in the ER if SOB, difficulty breathing or other concerns. Remain in isolation until symptoms resolve, notify close contacts.     If you have been instructed to have an in-person evaluation today at a local Urgent Care facility, please use the link below. It will take you to a list of all of our available [facility], including address, phone number and hours of operation. Please do not delay care.  [facility]  If you or a family member  do not have a primary care provider, use the link below to schedule a visit and establish care. When you choose a [facility] primary care physician or advanced practice provider, you gain a long-term partner in health. Find a Primary Care Provider  Learn more about [facility]'s in-office and virtual care options: [facility]

## 2021-12-07 DIAGNOSIS — Z20822 Contact with and (suspected) exposure to covid-19: Secondary | ICD-10-CM | POA: Diagnosis not present

## 2021-12-28 ENCOUNTER — Encounter: Payer: Self-pay | Admitting: Internal Medicine

## 2021-12-28 DIAGNOSIS — R069 Unspecified abnormalities of breathing: Secondary | ICD-10-CM

## 2022-01-01 DIAGNOSIS — R069 Unspecified abnormalities of breathing: Secondary | ICD-10-CM | POA: Insufficient documentation

## 2022-01-01 NOTE — Telephone Encounter (Signed)
Order placed for pulmonary referral.  

## 2022-01-01 NOTE — Telephone Encounter (Signed)
[facility] is that your preference as PCP/

## 2022-02-06 DIAGNOSIS — I493 Ventricular premature depolarization: Secondary | ICD-10-CM | POA: Diagnosis not present

## 2022-02-06 DIAGNOSIS — I429 Cardiomyopathy, unspecified: Secondary | ICD-10-CM | POA: Diagnosis not present

## 2022-02-12 ENCOUNTER — Encounter: Payer: Self-pay | Admitting: Pulmonary Disease

## 2022-02-12 ENCOUNTER — Other Ambulatory Visit: Payer: Self-pay

## 2022-02-12 ENCOUNTER — Other Ambulatory Visit
Admission: RE | Admit: 2022-02-12 | Discharge: 2022-02-12 | Disposition: A | Discharge status: Home / Self Care | Payer: Federal, State, Local not specified - PPO | Attending: Pulmonary Disease | Admitting: Pulmonary Disease

## 2022-02-12 ENCOUNTER — Ambulatory Visit: Payer: Federal, State, Local not specified - PPO | Admitting: Pulmonary Disease

## 2022-02-12 VITALS — BP 126/80 | HR 85 | Temp 98.2°F | Ht 68.0 in | Wt 221.0 lb

## 2022-02-12 DIAGNOSIS — R0600 Dyspnea, unspecified: Secondary | ICD-10-CM

## 2022-02-12 DIAGNOSIS — L603 Nail dystrophy: Secondary | ICD-10-CM | POA: Diagnosis not present

## 2022-02-12 DIAGNOSIS — J45998 Other asthma: Secondary | ICD-10-CM | POA: Insufficient documentation

## 2022-02-12 DIAGNOSIS — I428 Other cardiomyopathies: Secondary | ICD-10-CM

## 2022-02-12 DIAGNOSIS — M2012 Hallux valgus (acquired), left foot: Secondary | ICD-10-CM | POA: Diagnosis not present

## 2022-02-12 LAB — CBC WITH DIFFERENTIAL/PLATELET
Abs Immature Granulocytes: 0.01 10*3/uL (ref 0.00–0.07)
Basophils Absolute: 0.1 10*3/uL (ref 0.0–0.1)
Basophils Relative: 1 %
Eosinophils Absolute: 0.1 10*3/uL (ref 0.0–0.5)
Eosinophils Relative: 2 %
HCT: 42.3 % (ref 36.0–46.0)
Hemoglobin: 13.8 g/dL (ref 12.0–15.0)
Immature Granulocytes: 0 %
Lymphocytes Relative: 45 %
Lymphs Abs: 3.1 10*3/uL (ref 0.7–4.0)
MCH: 30.9 pg (ref 26.0–34.0)
MCHC: 32.6 g/dL (ref 30.0–36.0)
MCV: 94.8 fL (ref 80.0–100.0)
Monocytes Absolute: 0.4 10*3/uL (ref 0.1–1.0)
Monocytes Relative: 6 %
Neutro Abs: 3.2 10*3/uL (ref 1.7–7.7)
Neutrophils Relative %: 46 %
Platelets: 279 10*3/uL (ref 150–400)
RBC: 4.46 MIL/uL (ref 3.87–5.11)
RDW: 12.9 % (ref 11.5–15.5)
WBC: 6.8 10*3/uL (ref 4.0–10.5)
nRBC: 0 % (ref 0.0–0.2)

## 2022-02-12 MED ORDER — LEVALBUTEROL TARTRATE 45 MCG/ACT IN AERO: 2.0000 | INHALATION_SPRAY | Freq: Four times a day (QID) | RESPIRATORY_TRACT | 2 refills | Status: AC | PRN

## 2022-02-12 NOTE — Progress Notes (Signed)
Subjective:    Patient ID: Michele [name], female    DOB: 02-08-1991, 31 y.o.   MRN: [id number] Patient Care Team: Einar Pheasant, MD as PCP - General (Internal Medicine)  Chief Complaint  Patient presents with   Consult   HPI Patient is a 31 year old lifelong never smoker who presents for evaluation of sensation of "restricted breathing" particularly with change of seasons.  She is kindly referred by Dr. Einar Pheasant.  She has had a complex history and that she delivered a female infant in November 2021, course was, placated by DVT/PE (PE suspected but not definitively proven by imaging) she nevertheless completed Lovenox treatment for that.  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  She underwent ablation in February 8469 without complication and has been PVC free since then.  After that procedure she noted improvement of symptoms of shortness of breath and chest tightness. However, even before that time she has noted that particularly with seasonal change she notices some throat and chest tightness and the feeling that her breathing is "restricted".  He does not describe this as chest pain.  She notes that the symptoms are more prevalent in the spring summer and fall and absent in the winter.  Most of the symptoms, when she is exposed to outdoor air.  Symptoms usually last approximately an hour or subside when she goes indoors.  She does not do anything else for the symptoms and just "lets them resolve on its own".  She has issues with acid reflux however these are usually well controlled with as needed omeprazole and avoidance of offending foods.  There is a history of asthma in the family.  She does not endorse any fevers, chills or sweats.  No cough or sputum production.  No hemoptysis.  She works as a Engineer, manufacturing.  Resides with her spouse and son in [place].   Review of Systems A 10  point review of systems was performed and it is as noted above otherwise negative.  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   Anemia 11/26/2020   Rectal bleeding 02/08/2020   Increased thirst 07/17/2019   Hair loss 10/12/2018   Menstrual changes 10/12/2018   Low back pain 05/04/2016   Encounter for completion of form with patient 07/31/2015   Health care maintenance 01/31/2015   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   Sinusitis 09/01/2013   Palpitations 07/15/2013   Hx of migraines 07/15/2013   Hypercholesterolemia 07/15/2013   UTI (urinary tract infection) 07/15/2013   Family History  Problem Relation Age of Onset   Hyperlipidemia Father    Hypertension Father    Hypertension Mother    Breast cancer Maternal Grandmother    Prostate cancer Maternal Grandfather    Heart disease Maternal Grandfather    Hypertension  Maternal Grandfather    Diabetes Maternal Grandfather    Breast cancer Paternal Grandmother    Hyperlipidemia Paternal Grandmother    Hypertension Paternal Grandmother    Diabetes Paternal Grandmother    Mental illness Paternal Grandmother    Hyperlipidemia Paternal Grandfather    Stroke Paternal Grandfather    Hypertension Paternal Grandfather    Social History   Tobacco Use   Smoking status: Never   Smokeless tobacco: Never  Substance Use Topics   Alcohol use: Yes    Alcohol/week: 0.0 standard  drinks   No Known Allergies  Current Meds  Medication Sig   acetaminophen (TYLENOL) 500 MG tablet Take by mouth.   Cholecalciferol 25 MCG (1000 UT) capsule Take 1,000 Units by mouth daily.   Coenzyme Q10 (COQ-10 PO) Take by mouth.   levalbuterol (XOPENEX HFA) 45 MCG/ACT inhaler Inhale 2 puffs into the lungs every 6 (six) hours as needed for wheezing.   Magnesium 500 MG TABS Take 1,000 mg by mouth daily.   metoprolol succinate (TOPROL-XL) 50 MG 24 hr tablet Take 50 mg by mouth daily. Take with or immediately following a meal.   Multiple Vitamin (MULTIVITAMIN ADULT PO) Take by mouth.   Omega-3 Fatty Acids (FISH OIL) 1000 MG CAPS Take 1 capsule by mouth daily.   omeprazole (PRILOSEC) 20 MG capsule Take 20 mg by mouth as needed.   [DISCONTINUED] fluticasone (FLONASE) 50 MCG/ACT nasal spray Place 2 sprays into both nostrils daily for 14 days.   [DISCONTINUED] Prenatal Vit-Fe Fumarate-FA (PRENATAL PO) Take 1 capsule by mouth daily.   Immunization History  Administered Date(s) Administered   DTaP 03/17/1990, 05/26/1990, 09/03/1990, 03/02/1992, 03/25/1995   Hepatitis A 09/23/2006, 01/13/2008   HiB (PRP-OMP) 03/17/1990, 05/26/1990, 09/03/1990   Influenza Split 12/05/2013, 09/25/2014   Influenza,inj,Quad PF,6+ Mos 09/19/2017, 09/04/2020, 08/28/2021   Influenza-Unspecified 08/24/2018, 09/10/2019   MMR 02/11/1991, 05/31/1991, 03/25/1995   Moderna Sars-Covid-2 Vaccination 12/31/2019, 01/28/2020   OPV 03/17/1990, 05/26/1990, 03/25/1995   PFIZER(Purple Top)SARS-COV-2 Vaccination 01/17/2021   PPD Test 12/11/2015, 01/08/2016   Td 11/30/2004   Tdap 01/24/2014, 08/07/2020       Objective:   Physical Exam BP 126/80 (BP Location: Left Arm, Patient Position: Sitting, Cuff Size: Normal)    Pulse 85    Temp 98.2 F (36.8 C) (Oral)    Ht 5' 8"  (1.727 m)    Wt 221 lb (100.2 kg)    SpO2 100%    BMI 33.60 kg/m  GENERAL: Overweight woman, no acute distress, fully ambulatory.  No conversational  dyspnea. HEAD: Normocephalic, atraumatic.  EYES: Pupils equal, round, reactive to light.  No scleral icterus.  MOUTH: Nose/mouth/throat not examined due to masking requirements for COVID 19. NECK: Supple. No thyromegaly. Trachea midline. No JVD.  No adenopathy. PULMONARY: Good air entry bilaterally.  No adventitious sounds. CARDIOVASCULAR: S1 and S2. Regular rate and rhythm.  No rubs, murmurs or gallops heard. ABDOMEN: Benign. MUSCULOSKELETAL: No joint deformity, no clubbing, no edema.  NEUROLOGIC: No overt focal deficit, no gait disturbance, speech is fluent. SKIN: Intact,warm,dry. PSYCH: Mood and behavior normal  Chest x-ray performed 14 May 2021 showed some mild hyperinflation on my review:     Assessment & Plan:     ICD-10-CM   1. Dyspnea, unspecified type  R06.00    Suspect related to seasonal asthma Plan as below    2. Seasonal asthma - suspected  J45.998 Allergen Panel (27) + IGE    CBC w/Diff    Pulmonary Function Test [facility] Only  PFTs Levoalbuterol as needed Allergen panel, CBC with differential    3. 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 Status post ablation February 2022 Good LVEF, no sequela     Orders Placed This Encounter  Procedures   Allergen Panel (27) + IGE    Standing Status:   Future    Standing Expiration Date:   02/12/2023   CBC w/Diff    Standing Status:   Future    Standing Expiration Date:   02/12/2023   Pulmonary Function Test [facility] Only    Standing Status:   Future    Standing Expiration Date:   02/12/2023    Order Specific Question:   Full PFT: includes the following: basic spirometry, spirometry pre & post bronchodilator, diffusion capacity (DLCO), lung volumes    Answer:   Full PFT    Order Specific Question:   This test can only be performed at    Answer:   [facility] ordered this encounter  Medications   levalbuterol (XOPENEX HFA) 45 MCG/ACT inhaler    Sig: Inhale 2 puffs into the lungs every 6  (six) hours as needed for wheezing.    Dispense:  1 each    Refill:  2    Suspect the patient has seasonal asthma.  We will proceed with getting PFTs, allergen panel and give the patient a trial of levo albuterol as needed.  She is to keep a diary of levo albuterol use and also if this is helpful with her sensation of "restricted breathing".  We will see the patient in follow-up in 6 to 8 weeks time she is to contact us prior to that time should any new difficulties arise.   Renold Don, MD Advanced Bronchoscopy PCCM  [facility]    *This note was dictated using voice recognition software/Dragon.  Despite best efforts to proofread, errors can occur which can change the meaning. Any transcriptional errors that result from this process are unintentional and may not be fully corrected at the time of dictation.

## 2022-02-12 NOTE — Patient Instructions (Addendum)
We are getting some blood testing for allergies.  We will test your breathing capacity with a breathing test.  We are sending in a prescription of an emergency inhaler that you can use if you feel that tightness around your chest and throat.  You have difficulties getting it you may need to get it through [facility] and we may need to print a prescription for you.

## 2022-02-18 LAB — ALLERGEN PANEL (27) + IGE
Alternaria Alternata IgE: <0.1 kU/L
Aspergillus Fumigatus IgE: <0.1 kU/L
Bahia Grass IgE: <0.1 kU/L
Bermuda Grass IgE: <0.1 kU/L
Cat Dander IgE: <0.1 kU/L
Cedar, Mountain IgE: <0.1 kU/L
Cladosporium Herbarum IgE: <0.1 kU/L
Cocklebur IgE: <0.1 kU/L
Cockroach, American IgE: <0.1 kU/L
Common Silver Birch IgE: <0.1 kU/L
D Farinae IgE: <0.1 kU/L
D Pteronyssinus IgE: <0.1 kU/L
Dog Dander IgE: <0.1 kU/L
Elm, American IgE: <0.1 kU/L
Hickory, White IgE: <0.1 kU/L
IgE (Immunoglobulin E), Serum: 19 IU/mL (ref 6–495)
Johnson Grass IgE: <0.1 kU/L
Kentucky Bluegrass IgE: <0.1 kU/L
Maple/Box Elder IgE: <0.1 kU/L
Mucor Racemosus IgE: <0.1 kU/L
Oak, White IgE: <0.1 kU/L
Penicillium Chrysogen IgE: <0.1 kU/L
Pigweed, Rough IgE: <0.1 kU/L
Plantain, English IgE: <0.1 kU/L
Ragweed, Short IgE: <0.1 kU/L
Setomelanomma Rostrat: <0.1 kU/L
Timothy Grass IgE: <0.1 kU/L
White Mulberry IgE: <0.1 kU/L

## 2022-02-24 ENCOUNTER — Telehealth: Payer: Self-pay

## 2022-02-24 NOTE — Telephone Encounter (Signed)
LMTCB for pre-visit screening ?

## 2022-02-25 ENCOUNTER — Other Ambulatory Visit: Payer: Self-pay

## 2022-02-25 ENCOUNTER — Ambulatory Visit (INDEPENDENT_AMBULATORY_CARE_PROVIDER_SITE_OTHER): Payer: Federal, State, Local not specified - PPO | Admitting: Internal Medicine

## 2022-02-25 DIAGNOSIS — J45998 Other asthma: Secondary | ICD-10-CM

## 2022-02-25 DIAGNOSIS — E041 Nontoxic single thyroid nodule: Secondary | ICD-10-CM

## 2022-02-25 DIAGNOSIS — E7800 Pure hypercholesterolemia, unspecified: Secondary | ICD-10-CM

## 2022-02-25 DIAGNOSIS — Z713 Dietary counseling and surveillance: Secondary | ICD-10-CM

## 2022-02-25 DIAGNOSIS — O903 Peripartum cardiomyopathy: Secondary | ICD-10-CM | POA: Diagnosis not present

## 2022-02-25 NOTE — Assessment & Plan Note (Signed)
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  Recommended continuing metoprolol and echo in one year.  

## 2022-02-25 NOTE — Progress Notes (Unsigned)
Patient ID: Michele [name], female   DOB: Jun 30, 1991, 31 y.o.   MRN: [id number]   Subjective:    Patient ID: Michele [name], female    DOB: 05-11-1991, 31 y.o.   MRN: [id number]  This visit occurred during the SARS-CoV-2 public health emergency.  Safety protocols were in place, including screening questions prior to the visit, additional usage of staff PPE, and extensive cleaning of exam room while observing appropriate contact time as indicated for disinfecting solutions.   Patient here for a scheduled follow up .   HPI Here to follow up regarding her cholesterol, cardiomyopathy and palpitations.  S/p ablation in 01/2021.  Recently evaluated by Dr Jayme Cloud 02/12/22 - dyspnea and restricted breathing.  Seasonal asthma suspected.  Recomemnded levoalbuterol prn.  Allergen panel and PFTs.  Discussed using prior to exercise. Allergen panel ok.  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  Recommended continuing metoprolol and echo in one year.  Had f/u 11/27/21 - endocrinology - for thyroid nodule.  Stable.  Recommended f/u thyroid ultrasound in one year.  Overall feels good.  Feels she is doing relatively well.  No chest pain.  Breathing stable.  No acid reflux reported.  No abdominal pain.  Bowels moving.  Stopped breast feeding 10/2021.  Concerned regarding not being able to lose weight.  She has been exercising - going to the gym.  Also watching diet/carb intake.  Weight stable.  Interested in weight loss program. Discussed medication.  Given heart history - avoid stimulants.  Discussed GLP1 agonist.     Past Medical History:  Diagnosis Date   History of chicken pox    Hx of migraines    Hx: UTI (urinary tract infection)    Hyperlipidemia    Palpitations    Past Surgical History:  Procedure Laterality Date   TONSILLECTOMY AND ADENOIDECTOMY  1997   Family History  Problem Relation Age of Onset   Hyperlipidemia Father    Hypertension Father    Hypertension Mother     Breast cancer Maternal Grandmother    Prostate cancer Maternal Grandfather    Heart disease Maternal Grandfather    Hypertension Maternal Grandfather    Diabetes Maternal Grandfather    Breast cancer Paternal Grandmother    Hyperlipidemia Paternal Grandmother    Hypertension Paternal Grandmother    Diabetes Paternal Grandmother    Mental illness Paternal Grandmother    Hyperlipidemia Paternal Grandfather    Stroke Paternal Grandfather    Hypertension Paternal Grandfather    Social History   Socioeconomic History   Marital status: Married    Spouse name: Not on file   Number of children: 0   Years of education: Not on file   Highest education level: Not on file  Occupational History   Not on file  Tobacco Use   Smoking status: Never   Smokeless tobacco: Never  Vaping Use   Vaping Use: Never used  Substance and Sexual Activity   Alcohol use: Yes    Alcohol/week: 0.0 standard drinks   Drug use: No   Sexual activity: Yes  Other Topics Concern   Not on file  Social History Narrative   PT assistant at assisted living; lives in [place]; 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    Social Determinants of Health   Financial Resource Strain: Not on file  Food Insecurity: Not on file  Transportation Needs: Not on file  Physical Activity: Not on file  Stress: Not on file  Social Connections: Not on  file     Review of Systems  Constitutional:  Negative for appetite change and unexpected weight change.  HENT:  Negative for congestion and sinus pressure.   Respiratory:  Negative for cough, chest tightness and shortness of breath.   Cardiovascular:  Negative for chest pain, palpitations and leg swelling.  Gastrointestinal:  Negative for abdominal pain, diarrhea, nausea and vomiting.  Genitourinary:  Negative for difficulty urinating and dysuria.  Musculoskeletal:  Negative for joint swelling and myalgias.  Skin:  Negative for color change and rash.  Neurological:   Negative for dizziness, light-headedness and headaches.  Psychiatric/Behavioral:  Negative for agitation and dysphoric mood.       Objective:     BP 120/70    Pulse 90    Temp 97.8 F (36.6 C)    Resp 16    Ht 5\' 8"  (1.727 m)    Wt 221 lb (100.2 kg)    SpO2 99%    BMI 33.60 kg/m  Wt Readings from Last 3 Encounters:  02/25/22 221 lb (100.2 kg)  02/12/22 221 lb (100.2 kg)  08/28/21 207 lb (93.9 kg)    Physical Exam Vitals reviewed.  Constitutional:      General: She is not in acute distress.    Appearance: Normal appearance.  HENT:     Head: Normocephalic and atraumatic.     Right Ear: External ear normal.     Left Ear: External ear normal.  Eyes:     General: No scleral icterus.       Right eye: No discharge.        Left eye: No discharge.     Conjunctiva/sclera: Conjunctivae normal.  Neck:     Thyroid: No thyromegaly.  Cardiovascular:     Rate and Rhythm: Normal rate and regular rhythm.  Pulmonary:     Effort: No respiratory distress.     Breath sounds: Normal breath sounds. No wheezing.  Abdominal:     General: Bowel sounds are normal.     Palpations: Abdomen is soft.     Tenderness: There is no abdominal tenderness.  Musculoskeletal:        General: No swelling or tenderness.     Cervical back: Neck supple. No tenderness.  Lymphadenopathy:     Cervical: No cervical adenopathy.  Skin:    Findings: No erythema or rash.  Neurological:     Mental Status: She is alert.  Psychiatric:        Mood and Affect: Mood normal.        Behavior: Behavior normal.     Outpatient Encounter Medications as of 02/25/2022  Medication Sig   acetaminophen (TYLENOL) 500 MG tablet Take by mouth.   Cholecalciferol 25 MCG (1000 UT) capsule Take 1,000 Units by mouth daily.   Coenzyme Q10 (COQ-10 PO) Take by mouth.   levalbuterol (XOPENEX HFA) 45 MCG/ACT inhaler Inhale 2 puffs into the lungs every 6 (six) hours as needed for wheezing.   Magnesium 500 MG TABS Take 1,000 mg by mouth  daily.   metoprolol succinate (TOPROL-XL) 50 MG 24 hr tablet Take 50 mg by mouth daily. Take with or immediately following a meal.   Multiple Vitamin (MULTIVITAMIN ADULT PO) Take by mouth.   Omega-3 Fatty Acids (FISH OIL) 1000 MG CAPS Take 1 capsule by mouth daily.   omeprazole (PRILOSEC) 20 MG capsule Take 20 mg by mouth as needed.   No facility-administered encounter medications on file as of 02/25/2022.     Lab Results  Component Value Date   WBC 6.8 02/12/2022   HGB 13.8 02/12/2022   HCT 42.3 02/12/2022   PLT 279 02/12/2022   GLUCOSE 77 08/28/2021   CHOL 203 (H) 08/28/2021   TRIG 76.0 08/28/2021   HDL 67.40 08/28/2021   LDLCALC 120 (H) 08/28/2021   ALT 12 08/28/2021   AST 11 08/28/2021   NA 141 08/28/2021   K 4.8 08/29/2021   CL 105 08/28/2021   CREATININE 0.79 08/28/2021   BUN 8 08/28/2021   CO2 26 08/28/2021   TSH 2.46 08/28/2021   HGBA1C 5.1 07/21/2019       Assessment & Plan:   Problem List Items Addressed This Visit     Postpartum cardiomyopathy     Saw cardiology 02/06/22 - recovered EF/PVC cardiomyopathy.  Recommended continuing metoprolol and echo in one year.         Dale Ayden, MD

## 2022-03-03 ENCOUNTER — Telehealth: Payer: Self-pay | Admitting: Pulmonary Disease

## 2022-03-03 NOTE — Telephone Encounter (Signed)
Lm for reminder of covid test prior to PFT. ? ?03/06/2022 [facility]  ?

## 2022-03-04 NOTE — Telephone Encounter (Signed)
Lm x2 for patient.  Will close encounter per office protocol.   

## 2022-03-06 ENCOUNTER — Other Ambulatory Visit
Admission: RE | Admit: 2022-03-06 | Discharge: 2022-03-06 | Disposition: A | Discharge status: Home / Self Care | Payer: Federal, State, Local not specified - PPO | Source: Ambulatory Visit | Attending: Pulmonary Disease | Admitting: Pulmonary Disease

## 2022-03-06 ENCOUNTER — Other Ambulatory Visit: Payer: Self-pay

## 2022-03-06 DIAGNOSIS — Z01812 Encounter for preprocedural laboratory examination: Secondary | ICD-10-CM | POA: Insufficient documentation

## 2022-03-06 DIAGNOSIS — Z20822 Contact with and (suspected) exposure to covid-19: Secondary | ICD-10-CM | POA: Insufficient documentation

## 2022-03-07 ENCOUNTER — Ambulatory Visit: Payer: Federal, State, Local not specified - PPO | Attending: Pulmonary Disease

## 2022-03-07 DIAGNOSIS — J45998 Other asthma: Secondary | ICD-10-CM | POA: Insufficient documentation

## 2022-03-07 LAB — SARS CORONAVIRUS 2 (TAT 6-24 HRS): SARS Coronavirus 2: NEGATIVE

## 2022-03-08 ENCOUNTER — Encounter: Payer: Self-pay | Admitting: Internal Medicine

## 2022-03-08 DIAGNOSIS — J45998 Other asthma: Secondary | ICD-10-CM | POA: Insufficient documentation

## 2022-03-08 DIAGNOSIS — Z713 Dietary counseling and surveillance: Secondary | ICD-10-CM | POA: Insufficient documentation

## 2022-03-08 NOTE — Assessment & Plan Note (Signed)
Found on CT.  Saw Dr O'connell in f/u.  Recommended f/u end of year.   

## 2022-03-08 NOTE — Assessment & Plan Note (Signed)
Low cholesterol diet and exercise.  Follow lipid panel.   

## 2022-03-08 NOTE — Assessment & Plan Note (Addendum)
Recently evaluated by Dr Jayme Cloud 02/12/22 - dyspnea and restricted breathing.  Seasonal asthma suspected.  Recomemnded levoalbuterol prn. PFTs scheduled for next week.  ?

## 2022-03-08 NOTE — Assessment & Plan Note (Signed)
Discussed diet and exercise.  Discussed GLP1 agonist.  Discussed metformin.  Avoid stimulants given cardiac history.  Request referral to weight loss clinic.   ?

## 2022-03-10 LAB — PULMONARY FUNCTION TEST ARMC ONLY
DL/VA % pred: 89 %
DL/VA: 3.94 ml/min/mmHg/L
DLCO unc % pred: 93 %
DLCO unc: 23.72 ml/min/mmHg
FEF 25-75 Post: 4.42 L/sec
FEF 25-75 Pre: 3.67 L/sec
FEF2575-%Change-Post: 20 %
FEF2575-%Pred-Post: 121 %
FEF2575-%Pred-Pre: 100 %
FEV1-%Change-Post: 6 %
FEV1-%Pred-Post: 112 %
FEV1-%Pred-Pre: 106 %
FEV1-Post: 4 L
FEV1-Pre: 3.77 L
FEV1FVC-%Change-Post: 2 %
FEV1FVC-%Pred-Pre: 95 %
FEV6-%Change-Post: 3 %
FEV6-%Pred-Post: 114 %
FEV6-%Pred-Pre: 110 %
FEV6-Post: 4.85 L
FEV6-Pre: 4.67 L
FEV6FVC-%Pred-Post: 101 %
FEV6FVC-%Pred-Pre: 101 %
FVC-%Change-Post: 3 %
FVC-%Pred-Post: 113 %
FVC-%Pred-Pre: 109 %
FVC-Post: 4.85 L
FVC-Pre: 4.67 L
Post FEV1/FVC ratio: 83 %
Post FEV6/FVC ratio: 100 %
Pre FEV1/FVC ratio: 81 %
Pre FEV6/FVC Ratio: 100 %
RV % pred: 96 %
RV: 1.57 L
TLC % pred: 108 %
TLC: 6.15 L

## 2022-03-12 ENCOUNTER — Encounter: Payer: Self-pay | Admitting: Internal Medicine

## 2022-03-26 ENCOUNTER — Encounter: Payer: Self-pay | Admitting: Pulmonary Disease

## 2022-03-26 ENCOUNTER — Ambulatory Visit: Payer: Federal, State, Local not specified - PPO | Admitting: Pulmonary Disease

## 2022-03-26 VITALS — BP 104/70 | HR 80 | Temp 98.2°F | Ht 67.0 in | Wt 222.8 lb

## 2022-03-26 DIAGNOSIS — R0600 Dyspnea, unspecified: Secondary | ICD-10-CM | POA: Diagnosis not present

## 2022-03-26 DIAGNOSIS — J45998 Other asthma: Secondary | ICD-10-CM | POA: Diagnosis not present

## 2022-03-26 DIAGNOSIS — I428 Other cardiomyopathies: Secondary | ICD-10-CM

## 2022-03-26 MED ORDER — ARNUITY ELLIPTA 100 MCG/ACT IN AEPB: 1.0000 | INHALATION_SPRAY | Freq: Every day | RESPIRATORY_TRACT | 6 refills | Status: DC

## 2022-03-26 NOTE — Progress Notes (Addendum)
Subjective:    Patient ID: Michele [name], female    DOB: 05-07-1991, 31 y.o.   MRN: [id number] Patient Care Team: Einar Pheasant, MD as PCP - General (Internal Medicine)  Chief Complaint  Patient presents with   Follow-up    No concerns.   HPI Michele [name] is a 31 year old lifelong never smoker who presents for follow-up on "restricted breathing" particularly with change of seasons.  She was last seen here on 12 February 2022.  This is a scheduled visit.  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  She underwent ablation in February 9528 without complication and has been PVC free since then.  After that procedure she noted improvement of symptoms of shortness of breath and chest tightness. However, even before that time she has noted that particularly with seasonal change she notices some throat and chest tightness and the feeling that her breathing is "restricted".  He does not describe this as chest pain. She notes that the symptoms are more prevalent in the spring summer and fall and absent in the winter.  Most of the symptoms, when she is exposed to outdoor air.  Symptoms usually last approximately an hour or subside when she goes indoors.  She does not do anything else for the symptoms and just "lets them resolve on its own".  She has issues with acid reflux however these are usually well controlled with as needed omeprazole and avoidance of offending foods.  She does not endorse any fevers, chills or sweats.  No cough or sputum production.  No hemoptysis.  She notices that some of these sensations of restricted breathing occur after exercise.  She had pulmonary function testing performed 07 March 2022.  PFTs overall were normal, she does have small airways component and a normal diffusion capacity.  At the prior visit she was given a trial of levo albuterol to see if this would help with her episodes of breathlessness.  She notes that this helps greatly.  It relieves her symptoms completely.  Review of Systems A 10 point review of systems was performed and it is as noted above otherwise negative.   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   Anemia 11/26/2020   Rectal bleeding 02/08/2020   Increased thirst 07/17/2019   Hair loss 10/12/2018   Menstrual changes 10/12/2018   Low back pain 05/04/2016   Encounter for completion of form with patient 07/31/2015   Health care maintenance 01/31/2015   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   Cholecalciferol 25 MCG (1000 UT) capsule Take 1,000 Units by mouth daily.   Coenzyme Q10 (COQ-10 PO) Take by mouth.   levalbuterol (XOPENEX HFA) 45 MCG/ACT inhaler Inhale 2 puffs into the lungs every 6 (six) hours as needed for wheezing.   Magnesium 500 MG TABS Take 1,000 mg by mouth daily.   metoprolol succinate (TOPROL-XL) 50 MG 24 hr tablet Take 50 mg by mouth daily. Take with  or immediately following a meal.   Multiple Vitamin (MULTIVITAMIN ADULT PO) Take by mouth.   Omega-3 Fatty Acids (FISH OIL) 1000 MG CAPS Take 1 capsule by mouth daily.   omeprazole (PRILOSEC) 20 MG capsule Take 20 mg by mouth as needed.   psyllium (METAMUCIL) 58.6 % powder Take 1 packet by mouth daily.   Immunization History  Administered Date(s) Administered   DTaP 03/17/1990, 05/26/1990, 09/03/1990, 03/02/1992, 03/25/1995   Hepatitis A 09/23/2006, 01/13/2008   HiB (PRP-OMP) 03/17/1990, 05/26/1990, 09/03/1990   Influenza Split 12/05/2013, 09/25/2014   Influenza,inj,Quad PF,6+ Mos 09/19/2017, 09/04/2020, 08/28/2021   Influenza-Unspecified 08/24/2018, 09/10/2019   MMR 02/11/1991, 05/31/1991, 03/25/1995   Moderna Sars-Covid-2 Vaccination 12/31/2019, 01/28/2020   OPV 03/17/1990, 05/26/1990, 03/25/1995   PFIZER(Purple Top)SARS-COV-2 Vaccination 01/17/2021   PPD Test 12/11/2015, 01/08/2016   Td 11/30/2004   Tdap 01/24/2014, 08/07/2020      Objective:   Physical Exam BP 104/70 (BP Location: Right Arm, Patient Position: Sitting, Cuff Size: Normal)   Pulse 80   Temp 98.2 F (36.8 C) (Oral)   Ht 5' 7" (1.702 m)   Wt 222 lb 12.8 oz (101.1 kg)   SpO2 99%   BMI 34.90 kg/m  GENERAL: Overweight woman, no acute distress, fully ambulatory.  No conversational dyspnea. HEAD: Normocephalic, atraumatic.  EYES: Pupils equal, round, reactive to light.  No scleral icterus.  MOUTH: Nose/mouth/throat not examined due to masking requirements for COVID 19. NECK: Supple. No thyromegaly. Trachea midline. No JVD.  No adenopathy. PULMONARY: Good air entry bilaterally.  No adventitious sounds. CARDIOVASCULAR: S1 and S2. Regular rate and rhythm.  No rubs, murmurs or gallops heard. ABDOMEN: Benign. MUSCULOSKELETAL: No joint deformity, no clubbing, no edema.  NEUROLOGIC: No overt focal deficit, no gait disturbance, speech is fluent. SKIN: Intact,warm,dry. PSYCH: Mood and behavior normal      Assessment  & Plan:     ICD-10-CM   1. Seasonal asthma - mild  J45.998    Trial of Arnuity Ellipta 100 mcg 1 puff daily Continue Xopenex as needed    2. Dyspnea, unspecified type  R06.00    Overall improved Still having some issues post exercise    3. Other cardiomyopathy ([place])  I42.8    This issue adds complexity to her management Due to high burden PVCs Status post ablation Asymptomatic in this regard Follows with cardiology     Meds ordered this encounter  Medications   Fluticasone Furoate (ARNUITY ELLIPTA) 100 MCG/ACT AEPB    Sig: Inhale 1 puff into the lungs daily.    Dispense:  30 each    Refill:  6   We will give patient a trial of Arnuity Ellipta, continue using Xopenex as needed.  We will see her in follow-up in 2 months time she is to call sooner should any new problems arise.   Renold Don, MD Advanced Bronchoscopy [facility]    *This note was dictated using voice recognition software/Dragon.  Despite  best efforts to proofread, errors can occur which can change the meaning. Any transcriptional errors that result from this process are unintentional and may not be fully corrected at the time of dictation.

## 2022-03-26 NOTE — Patient Instructions (Signed)
We are going to give a trial of an inhaler called Arnuity Ellipta 1 inhalation daily make sure you rinse your mouth well after use it.  Let us know if you have difficulty getting the inhaler. ? ?Continue using your as needed Xopenex. ? ?We will see you in follow-up in 2 months time call sooner should any new problems arise. ?

## 2022-03-26 NOTE — Progress Notes (Signed)
Patient seen in the office today and instructed on use of Arnuity.  Patient expressed understanding and demonstrated technique. 

## 2022-04-01 DIAGNOSIS — Z0289 Encounter for other administrative examinations: Secondary | ICD-10-CM

## 2022-04-08 ENCOUNTER — Encounter (INDEPENDENT_AMBULATORY_CARE_PROVIDER_SITE_OTHER): Payer: Self-pay | Admitting: Bariatrics

## 2022-04-08 ENCOUNTER — Ambulatory Visit (INDEPENDENT_AMBULATORY_CARE_PROVIDER_SITE_OTHER): Payer: Federal, State, Local not specified - PPO | Admitting: Bariatrics

## 2022-04-08 VITALS — BP 131/84 | HR 78 | Temp 98.0°F | Ht 67.0 in | Wt 220.0 lb

## 2022-04-08 DIAGNOSIS — G4733 Obstructive sleep apnea (adult) (pediatric): Secondary | ICD-10-CM

## 2022-04-08 DIAGNOSIS — Z1331 Encounter for screening for depression: Secondary | ICD-10-CM

## 2022-04-08 DIAGNOSIS — E7800 Pure hypercholesterolemia, unspecified: Secondary | ICD-10-CM | POA: Diagnosis not present

## 2022-04-08 DIAGNOSIS — R0602 Shortness of breath: Secondary | ICD-10-CM

## 2022-04-08 DIAGNOSIS — E559 Vitamin D deficiency, unspecified: Secondary | ICD-10-CM | POA: Diagnosis not present

## 2022-04-08 DIAGNOSIS — M545 Low back pain, unspecified: Secondary | ICD-10-CM | POA: Diagnosis not present

## 2022-04-08 DIAGNOSIS — R5383 Other fatigue: Secondary | ICD-10-CM

## 2022-04-08 DIAGNOSIS — E669 Obesity, unspecified: Secondary | ICD-10-CM

## 2022-04-08 DIAGNOSIS — Z6834 Body mass index (BMI) 34.0-34.9, adult: Secondary | ICD-10-CM

## 2022-04-08 DIAGNOSIS — E041 Nontoxic single thyroid nodule: Secondary | ICD-10-CM

## 2022-04-08 DIAGNOSIS — R7309 Other abnormal glucose: Secondary | ICD-10-CM

## 2022-04-09 LAB — HEMOGLOBIN A1C
Est. average glucose Bld gHb Est-mCnc: 105 mg/dL
Hgb A1c MFr Bld: 5.3 % (ref 4.8–5.6)

## 2022-04-09 LAB — TSH+T4F+T3FREE
Free T4: 1.3 ng/dL (ref 0.82–1.77)
T3, Free: 3 pg/mL (ref 2.0–4.4)
TSH: 2.12 u[IU]/mL (ref 0.450–4.500)

## 2022-04-09 LAB — COMPREHENSIVE METABOLIC PANEL
ALT: 12 IU/L (ref 0–32)
AST: 14 IU/L (ref 0–40)
Albumin/Globulin Ratio: 1.6 (ref 1.2–2.2)
Albumin: 4.6 g/dL (ref 3.8–4.8)
Alkaline Phosphatase: 67 IU/L (ref 44–121)
BUN/Creatinine Ratio: 11 (ref 9–23)
BUN: 9 mg/dL (ref 6–20)
Bilirubin Total: 0.5 mg/dL (ref 0.0–1.2)
CO2: 23 mmol/L (ref 20–29)
Calcium: 9.1 mg/dL (ref 8.7–10.2)
Chloride: 102 mmol/L (ref 96–106)
Creatinine, Ser: 0.8 mg/dL (ref 0.57–1.00)
Globulin, Total: 2.8 g/dL (ref 1.5–4.5)
Glucose: 79 mg/dL (ref 70–99)
Potassium: 4.5 mmol/L (ref 3.5–5.2)
Sodium: 143 mmol/L (ref 134–144)
Total Protein: 7.4 g/dL (ref 6.0–8.5)
eGFR: 100 mL/min/{1.73_m2} (ref >59)

## 2022-04-09 LAB — LIPID PANEL WITH LDL/HDL RATIO
Cholesterol, Total: 212 mg/dL — ABNORMAL HIGH (ref 100–199)
HDL: 63 mg/dL (ref >39)
LDL Chol Calc (NIH): 133 mg/dL — ABNORMAL HIGH (ref 0–99)
LDL/HDL Ratio: 2.1 ratio (ref 0.0–3.2)
Triglycerides: 91 mg/dL (ref 0–149)
VLDL Cholesterol Cal: 16 mg/dL (ref 5–40)

## 2022-04-09 LAB — INSULIN, RANDOM: INSULIN: 8.7 u[IU]/mL (ref 2.6–24.9)

## 2022-04-09 LAB — VITAMIN D 25 HYDROXY (VIT D DEFICIENCY, FRACTURES): Vit D, 25-Hydroxy: 40.5 ng/mL (ref 30.0–100.0)

## 2022-04-12 ENCOUNTER — Ambulatory Visit: Payer: Self-pay

## 2022-04-21 ENCOUNTER — Other Ambulatory Visit
Admission: RE | Admit: 2022-04-21 | Discharge: 2022-04-21 | Disposition: A | Discharge status: Home / Self Care | Payer: Federal, State, Local not specified - PPO | Attending: Pulmonary Disease | Admitting: Pulmonary Disease

## 2022-04-21 ENCOUNTER — Encounter: Payer: Self-pay | Admitting: Pulmonary Disease

## 2022-04-21 DIAGNOSIS — R0602 Shortness of breath: Secondary | ICD-10-CM | POA: Diagnosis not present

## 2022-04-21 LAB — D-DIMER, QUANTITATIVE: D-Dimer, Quant: 0.56 ug/mL-FEU — ABNORMAL HIGH (ref 0.00–0.50)

## 2022-04-21 NOTE — Telephone Encounter (Signed)
This symptoms are not classic for a PE.  The issues in her throat may indicate that she is having issues with reflux.  She may have reflux and woken herself up with shortness of breath.  Before getting a scan insurance would request a D-dimer to be done.  We can order that and if elevated we can get a CT.  She should have an appointment with me or the nurse practitioner in the next few weeks. ?

## 2022-04-21 NOTE — Telephone Encounter (Signed)
I have been on the new daily inhaler for about a month  now, and I haven't noticed too much of a different with my symptoms. I still fell the tightness in my throat and then last night I woke up feeling SOB. I had used the levbuterol before I went to sleep since I was having the tight feeling, but still woke up SOB.  I was able to calm back down to fall asleep again, but I was wondering if I needed to give it more time to work or if I needed to have it changed or if I had some other issue that a scan would show.  (After having that DVT and PE  about a year ago and not knowing it,  I guess I'm afraid it didn't resolve or something since I did not have any further scans after finishing the blood thinner). Thank you. Sorry for the long message.  ? ? ?Dr. Jayme Cloud, please advise. Thanks ? ? ? ?

## 2022-04-22 ENCOUNTER — Encounter (INDEPENDENT_AMBULATORY_CARE_PROVIDER_SITE_OTHER): Payer: Self-pay | Admitting: Bariatrics

## 2022-04-22 ENCOUNTER — Other Ambulatory Visit: Payer: Self-pay

## 2022-04-22 ENCOUNTER — Ambulatory Visit (INDEPENDENT_AMBULATORY_CARE_PROVIDER_SITE_OTHER): Payer: Federal, State, Local not specified - PPO | Admitting: Bariatrics

## 2022-04-22 ENCOUNTER — Ambulatory Visit
Admission: RE | Admit: 2022-04-22 | Discharge: 2022-04-22 | Disposition: A | Discharge status: Home / Self Care | Payer: Federal, State, Local not specified - PPO | Source: Ambulatory Visit | Attending: Pulmonary Disease | Admitting: Pulmonary Disease

## 2022-04-22 VITALS — BP 125/82 | HR 69 | Ht 67.0 in | Wt 214.0 lb

## 2022-04-22 DIAGNOSIS — M549 Dorsalgia, unspecified: Secondary | ICD-10-CM | POA: Diagnosis not present

## 2022-04-22 DIAGNOSIS — R7989 Other specified abnormal findings of blood chemistry: Secondary | ICD-10-CM | POA: Diagnosis not present

## 2022-04-22 DIAGNOSIS — E669 Obesity, unspecified: Secondary | ICD-10-CM | POA: Diagnosis not present

## 2022-04-22 DIAGNOSIS — E7800 Pure hypercholesterolemia, unspecified: Secondary | ICD-10-CM | POA: Diagnosis not present

## 2022-04-22 DIAGNOSIS — Z6833 Body mass index (BMI) 33.0-33.9, adult: Secondary | ICD-10-CM

## 2022-04-22 DIAGNOSIS — M545 Low back pain, unspecified: Secondary | ICD-10-CM

## 2022-04-22 DIAGNOSIS — F5089 Other specified eating disorder: Secondary | ICD-10-CM

## 2022-04-22 DIAGNOSIS — R0602 Shortness of breath: Secondary | ICD-10-CM | POA: Diagnosis not present

## 2022-04-22 MED ORDER — BUPROPION HCL ER (SR) 150 MG PO TB12: 150.0000 mg | ORAL_TABLET | Freq: Every day | ORAL | 0 refills | Status: DC

## 2022-04-22 MED ORDER — IOHEXOL 350 MG/ML SOLN
75.0000 mL | Freq: Once | INTRAVENOUS | Status: AC | PRN
  Administered 2022-04-22: 75 mL via INTRAVENOUS

## 2022-04-22 NOTE — Progress Notes (Signed)
? ? ?Chief Complaint:  ? ?OBESITY ?Michele [name] (MR# [id number]) is a 31 y.o. female who presents for evaluation and treatment of obesity and related comorbidities. Current BMI is Body mass index is 34.46 kg/m?Marland Kitchen Michele [name] has been struggling with her weight for many years and has been unsuccessful in either losing weight, maintaining weight loss, or reaching her healthy weight goal. ? ?Michele [name] states that she likes to cook if she has time. She has some cravings and sometimes has large portion sizes.  ? ?Michele [name] is currently in the action stage of change and ready to dedicate time achieving and maintaining a healthier weight. Michele [name] is interested in becoming our patient and working on intensive lifestyle modifications including (but not limited to) diet and exercise for weight loss. ? ?Michele [name]'s habits were reviewed today and are as follows: Her family eats meals together, she thinks her family will eat healthier with her, she struggles with family and or coworkers weight loss sabotage, her desired weight loss is 40 pounds, she has been heavy most of her life, she started gaining weight after college, her heaviest weight ever was 248 pounds, she has significant food cravings issues, she snacks frequently in the evenings, she is frequently drinking liquids with calories, she frequently makes poor food choices, she frequently eats larger portions than normal, she has binge eating behaviors, and she struggles with emotional eating. ? ?Depression Screen ?Michele [name]'s Food and Mood (modified PHQ-9) score was 7. ? ? ?  04/08/2022  ?  8:26 AM  ?Depression screen PHQ 2/9  ?Decreased Interest 1  ?Down, Depressed, Hopeless 2  ?PHQ - 2 Score 3  ?Altered sleeping 0  ?Tired, decreased energy 1  ?Change in appetite 1  ?Feeling bad or failure about yourself  1  ?Trouble concentrating 1  ?Moving slowly or fidgety/restless 0  ?Suicidal thoughts 0  ?PHQ-9 Score 7  ?Difficult doing work/chores Not difficult at all  ? ?Subjective:  ? ?1. Other  fatigue ?Michele [name] admits to daytime somnolence and admits to waking up still tired. Patient has a history of symptoms of daytime fatigue and morning fatigue. Michele [name] generally gets 7 hours of sleep per night, and states that she has difficulty falling asleep. Snoring is present. Apneic episodes is present. Epworth Sleepiness Score is 1.  Yahayra will continue activities.  ? ?2. SOB (shortness of breath) on exertion ?Shakita notes increasing shortness of breath with exercising and seems to be worsening over time with weight gain. She notes getting out of breath sooner with activity than she used to. This has not gotten worse recently. Azzareya denies shortness of breath at rest or orthopnea.  ? ?3. Hypercholesterolemia ?Michele [name] is currently taking Omega 3 FFA.  ? ?4. Thyroid nodule ?Michele [name] had a CT scan and Ultra Sound.  ? ?5. Michele [name] back pain, unspecified back pain laterality, unspecified chronicity, unspecified whether sciatica present ?Michele [name] did gymnastic when she was younger.  ? ?6. OSA (obstructive sleep apnea) ?Michele [name] was diagnosed with sleep apnea in her childhood. She is not using a CPAP. She had tonsillectomy and adenoidectomy which improved her symptoms, but no follow-up study.  ? ?7. Vitamin D deficiency ?Michele [name] is currently taking Vitamin D.  ? ?8. Elevated glucose ?Michele [name] is not on medications currently.  ? ?Assessment/Plan:  ? ?1. Other fatigue ?Michele [name] does feel that her weight is causing her energy to be lower than it should be. Fatigue may be related to obesity, depression or many other causes. Labs will be ordered, and in the meanwhile, [address]  will focus on self care including making healthy food choices, increasing physical activity and focusing on stress reduction. Clata will gradually increase activities. We will check EKG and CMP today.  ? ?- EKG 12-Lead ?- Comprehensive metabolic panel ? ?2. SOB (shortness of breath) on exertion ?Michele [name] does feel that she gets out of breath more easily that she used to when she  exercises. Michele [name]'s shortness of breath appears to be obesity related and exercise induced. She has agreed to work on weight loss and gradually increase exercise to treat her exercise induced shortness of breath. Will continue to monitor closely.  ? ?3. Hypercholesterolemia ?Cardiovascular risk and specific lipid/LDL goals reviewed.  Michele [name] will continue Omega 3 FFA. She will read labels. We will check lipid panel today. We discussed several lifestyle modifications today and Shaelie will continue to work on diet, exercise and weight loss efforts. Orders and follow up as documented in patient record.  ? ?Counseling ?Intensive lifestyle modifications are the first line treatment for this issue. ?Dietary changes: Increase soluble fiber. Decrease simple carbohydrates. ?Exercise changes: Moderate to vigorous-intensity aerobic activity 150 minutes per week if tolerated. ?Lipid-lowering medications: see documented in medical record. ? ?- Lipid Panel With LDL/HDL Ratio ? ?4. Thyroid nodule ?Michele [name] will follow up with endocrinologist and will follow. We will check TSH today.  ? ?- TSH+T4F+T3Free ? ?5. Michele [name] back pain, unspecified back pain laterality, unspecified chronicity, unspecified whether sciatica present ?Michele [name] will follow over time.  ? ?6. OSA (obstructive sleep apnea) ?Intensive lifestyle modifications are the first line treatment for this issue. We will follow over times. We discussed several lifestyle modifications today and she will continue to work on diet, exercise and weight loss efforts. We will continue to monitor. Orders and follow up as documented in patient record.   ? ?7. Vitamin D deficiency ?Michele [name] Vitamin D level contributes to fatigue and are associated with obesity, breast, and colon cancer. We will check Vitamin D and Michele [name] will follow-up for routine testing of Vitamin D, at least 2-3 times per year to avoid over-replacement. ? ?- VITAMIN D 25 Hydroxy (Vit-D Deficiency, Fractures) ? ?8. Elevated glucose ?We  will check insulin and A1C  today.  ? ?- Insulin, random ?- Hemoglobin A1c ? ?9. Depression screen ?Michele [name] had a positive depression screening. Depression is commonly associated with obesity and often results in emotional eating behaviors. We will monitor this closely and work on CBT to help improve the non-hunger eating patterns. Referral to Psychology may be required if no improvement is seen as she continues in our clinic.  ? ?10. Class 1 obesity with serious comorbidity and body mass index (BMI) of 34.0 to 34.9 in adult, unspecified obesity type ?Michele [name] is currently in the action stage of change and her goal is to continue with weight loss efforts. I recommend Michele [name] begin the structured treatment plan as follows: ? ?She has agreed to the Category 3 Plan. ? ?Exercise goals:  As is.    ? ?Behavioral modification strategies: increasing lean protein intake, decreasing simple carbohydrates, increasing vegetables, increasing water intake, decreasing eating out, no skipping meals, meal planning and cooking strategies, keeping healthy foods in the home, and planning for success. ? ?She was informed of the importance of frequent follow-up visits to maximize her success with intensive lifestyle modifications for her multiple health conditions. She was informed we would discuss her lab results at her next visit unless there is a critical issue that needs to be addressed sooner. Michele [name] agreed to keep her  next visit at the agreed upon time to discuss these results. ? ?Objective:  ? ?Blood pressure 131/84, pulse 78, temperature 98 ?F (36.7 ?C), height 5\' 7"  (1.702 m), weight 220 lb (99.8 kg), last menstrual period 03/31/2022, SpO2 99 %. Body mass index is 34.46 kg/m?. ? ?EKG: Normal sinus rhythm, rate 80 bpm. ? ?Indirect Calorimeter completed today shows a VO2 of 341 and a REE of 2347.  Her calculated basal metabolic rate is 0000000 thus her basal metabolic rate is worse than expected. ? ?General: Cooperative, alert, well  developed, in no acute distress. ?HEENT: Conjunctivae and lids unremarkable. ?Cardiovascular: Regular rhythm.  ?Lungs: Normal work of breathing. ?Neurologic: No focal deficits.  ? ?Lab Results  ?Component Value Date

## 2022-04-23 ENCOUNTER — Encounter (INDEPENDENT_AMBULATORY_CARE_PROVIDER_SITE_OTHER): Payer: Self-pay | Admitting: Bariatrics

## 2022-04-30 NOTE — Progress Notes (Signed)
Chief Complaint:   OBESITY Michele [name] is here to discuss her progress with her obesity treatment plan along with follow-up of her obesity related diagnoses. Michele [name] is on the Category 3 Plan and states she is following her eating plan approximately 95% of the time. Michele [name] states she is doing cardio and weight lifting for 30 minutes 5 times per week.  Today's visit was #: 2 Starting weight: 220 lbs Starting date: 04/08/2022 Today's weight: 214 lbs Today's date: 04/22/2022 Total lbs lost to date: 6 lbs Total lbs lost since last in-office visit: 6 lbs  Interim History: Michele [name] is down 6 lbs since her first visit. She states that it has been hard at times.   Subjective:   1. Hypercholesterolemia Rashada is taking fish oil currently.   2. Back pain, unspecified back location, unspecified back pain laterality, unspecified chronicity We discussed low back pain today.   3. Other disorder of eating Michele [name] is not on contraindications.   Assessment/Plan:   1. Hypercholesterolemia Cardiovascular risk and specific lipid/LDL goals reviewed.  Michele [name] will continue taking fish oil. She will have no trans fats. She will minimize saturated except unprocessed meats diary. We discussed several lifestyle modifications today and Michele [name] will continue to work on diet, exercise and weight loss efforts. Orders and follow up as documented in patient record.   Counseling Intensive lifestyle modifications are the first line treatment for this issue. Dietary changes: Increase soluble fiber. Decrease simple carbohydrates. Exercise changes: Moderate to vigorous-intensity aerobic activity 150 minutes per week if tolerated. Lipid-lowering medications: see documented in medical record.  2. Back pain, unspecified back location, unspecified back pain laterality, unspecified chronicity Will continue to increase activity/exercise. No pounding exercises.   3. Other disorder of eating Behavior modification techniques were  discussed today to help Michele [name] deal with her emotional/non-hunger eating behaviors.  We will refill Wellbutrin 150 mg for 1 month with no refills. We discussed side effects, risks, and benefits. Orders and follow up as documented in patient record.   - buPROPion (WELLBUTRIN SR) 150 MG 12 hr tablet; Take 1 tablet (150 mg total) by mouth daily.  Dispense: 30 tablet; Refill: 0  4. Obesity, Current BMI 33.6 Michele [name] is currently in the action stage of change. As such, her goal is to continue with weight loss efforts. She has agreed to the Category 3 Plan.   Michele [name] will continue meal planning. We reviewed labs from 04/08/2022 CMP, Lipid, Vitamin D, and Hbg A1C. Handout Essential hunger vs emotional hunger. Dining Out Guide was provided today.   Exercise goals:  As is.   Behavioral modification strategies: increasing lean protein intake, decreasing simple carbohydrates, increasing vegetables, increasing water intake, decreasing eating out, no skipping meals, meal planning and cooking strategies, keeping healthy foods in the home, and planning for success.  Michele [name] has agreed to follow-up with our clinic in 3 weeks with nurse practitioner and 6 weeks with myself. She was informed of the importance of frequent follow-up visits to maximize her success with intensive lifestyle modifications for her multiple health conditions.   Objective:   Blood pressure 125/82, pulse 69, height 5\' 7"  (1.702 m), weight 214 lb (97.1 kg), last menstrual period 03/31/2022, SpO2 99 %. Body mass index is 33.52 kg/m.  General: Cooperative, alert, well developed, in no acute distress. HEENT: Conjunctivae and lids unremarkable. Cardiovascular: Regular rhythm.  Lungs: Normal work of breathing. Neurologic: No focal deficits.   Lab Results  Component Value Date   CREATININE 0.80 04/08/2022   BUN  9 04/08/2022   NA 143 04/08/2022   K 4.5 04/08/2022   CL 102 04/08/2022   CO2 23 04/08/2022   Lab Results  Component Value Date    ALT 12 04/08/2022   AST 14 04/08/2022   ALKPHOS 67 04/08/2022   BILITOT 0.5 04/08/2022   Lab Results  Component Value Date   HGBA1C 5.3 04/08/2022   HGBA1C 5.1 07/21/2019   HGBA1C 5.0 08/04/2016   Lab Results  Component Value Date   INSULIN 8.7 04/08/2022   Lab Results  Component Value Date   TSH 2.120 04/08/2022   Lab Results  Component Value Date   CHOL 212 (H) 04/08/2022   HDL 63 04/08/2022   LDLCALC 133 (H) 04/08/2022   TRIG 91 04/08/2022   CHOLHDL 3 08/28/2021   Lab Results  Component Value Date   VD25OH 40.5 04/08/2022   Lab Results  Component Value Date   WBC 6.8 02/12/2022   HGB 13.8 02/12/2022   HCT 42.3 02/12/2022   MCV 94.8 02/12/2022   PLT 279 02/12/2022   No results found for: IRON, TIBC, FERRITIN  Attestation Statements:   Reviewed by clinician on day of visit: allergies, medications, problem list, medical history, surgical history, family history, social history, and previous encounter notes.  I, Lizbeth Bark, RMA, am acting as Location manager for [employer], DO.  I have reviewed the above documentation for accuracy and completeness, and I agree with the above. Jearld Lesch, DO

## 2022-05-13 ENCOUNTER — Encounter (INDEPENDENT_AMBULATORY_CARE_PROVIDER_SITE_OTHER): Payer: Self-pay | Admitting: Nurse Practitioner

## 2022-05-13 ENCOUNTER — Ambulatory Visit (INDEPENDENT_AMBULATORY_CARE_PROVIDER_SITE_OTHER): Payer: Federal, State, Local not specified - PPO | Admitting: Nurse Practitioner

## 2022-05-13 VITALS — BP 128/83 | HR 62 | Temp 97.4°F | Ht 67.0 in | Wt 209.0 lb

## 2022-05-13 DIAGNOSIS — Z6832 Body mass index (BMI) 32.0-32.9, adult: Secondary | ICD-10-CM | POA: Diagnosis not present

## 2022-05-13 DIAGNOSIS — E7800 Pure hypercholesterolemia, unspecified: Secondary | ICD-10-CM

## 2022-05-13 DIAGNOSIS — E669 Obesity, unspecified: Secondary | ICD-10-CM | POA: Diagnosis not present

## 2022-05-13 DIAGNOSIS — F5089 Other specified eating disorder: Secondary | ICD-10-CM | POA: Diagnosis not present

## 2022-05-21 NOTE — Progress Notes (Signed)
Chief Complaint:   OBESITY Michele [name] is here to discuss her progress with her obesity treatment plan along with follow-up of her obesity related diagnoses. Michele [name] is on the Category 3 Plan and states she is following her eating plan approximately 95% of the time. Michele [name] states she is doing cardio and weight training 30 minutes 5 times per week.  Today's visit was #: 3 Starting weight: 220 lbs Starting date: 04/08/2022 Today's weight: 209 lbs Today's date: 05/13/2022 Total lbs lost to date: 11 Total lbs lost since last in-office visit: 5  Interim History: Michele [name] is doing well with weight loss. She notes getting bored with the plan. She does well with breakfast and lunch but struggles with dinner ideas. Pt notes some hunger and cravings. She drinking coffee and water.  Subjective:   1. Hypercholesterolemia Michele [name] has never been on statin therapy. She has family history in mother and father with HLD.  2. Other disorder of eating Pt never started Wellbutrin. She would like to hold off on starting it at this time.  Assessment/Plan:   1. Hypercholesterolemia Cardiovascular risk and specific lipid/LDL goals reviewed.  We discussed several lifestyle modifications today and Ifra will continue to work on diet, exercise and weight loss efforts. Orders and follow up as documented in patient record.   Counseling Intensive lifestyle modifications are the first line treatment for this issue. Dietary changes: Increase soluble fiber. Decrease simple carbohydrates. Exercise changes: Moderate to vigorous-intensity aerobic activity 150 minutes per week if tolerated. Lipid-lowering medications: see documented in medical record.  2. Other disorder of eating Behavior modification techniques were discussed today to help Michele [name] deal with her emotional/non-hunger eating behaviors.  Orders and follow up as documented in patient record.   3. Obesity, Current BMI 32.8 Michele [name] is currently in the action stage of  change. As such, her goal is to continue with weight loss efforts. She has agreed to the Category 3 Plan.   Dinner options discussed today.  Exercise goals:  As is  Behavioral modification strategies: increasing lean protein intake, increasing water intake, and no skipping meals.  Michele [name] has agreed to follow-up with our clinic in 3 weeks. She was informed of the importance of frequent follow-up visits to maximize her success with intensive lifestyle modifications for her multiple health conditions.   Objective:   Blood pressure 128/83, pulse 62, temperature (!) 97.4 F (36.3 C), height 5\' 7"  (1.702 m), weight 209 lb (94.8 kg), SpO2 100 %. Body mass index is 32.73 kg/m.  General: Cooperative, alert, well developed, in no acute distress. HEENT: Conjunctivae and lids unremarkable. Cardiovascular: Regular rhythm.  Lungs: Normal work of breathing. Neurologic: No focal deficits.   Lab Results  Component Value Date   CREATININE 0.80 04/08/2022   BUN 9 04/08/2022   NA 143 04/08/2022   K 4.5 04/08/2022   CL 102 04/08/2022   CO2 23 04/08/2022   Lab Results  Component Value Date   ALT 12 04/08/2022   AST 14 04/08/2022   ALKPHOS 67 04/08/2022   BILITOT 0.5 04/08/2022   Lab Results  Component Value Date   HGBA1C 5.3 04/08/2022   HGBA1C 5.1 07/21/2019   HGBA1C 5.0 08/04/2016   Lab Results  Component Value Date   INSULIN 8.7 04/08/2022   Lab Results  Component Value Date   TSH 2.120 04/08/2022   Lab Results  Component Value Date   CHOL 212 (H) 04/08/2022   HDL 63 04/08/2022   LDLCALC 133 (H) 04/08/2022  TRIG 91 04/08/2022   CHOLHDL 3 08/28/2021   Lab Results  Component Value Date   VD25OH 40.5 04/08/2022   Lab Results  Component Value Date   WBC 6.8 02/12/2022   HGB 13.8 02/12/2022   HCT 42.3 02/12/2022   MCV 94.8 02/12/2022   PLT 279 02/12/2022    Attestation Statements:   Reviewed by clinician on day of visit: allergies, medications, problem list,  medical history, surgical history, family history, social history, and previous encounter notes.  I, Kathlene November, BS, CMA, am acting as transcriptionist for Everardo Pacific, FNP.  I have reviewed the above documentation for accuracy and completeness, and I agree with the above. Everardo Pacific, FNP

## 2022-05-28 ENCOUNTER — Encounter: Payer: Self-pay | Admitting: Pulmonary Disease

## 2022-05-28 ENCOUNTER — Other Ambulatory Visit (INDEPENDENT_AMBULATORY_CARE_PROVIDER_SITE_OTHER): Payer: Self-pay | Admitting: Bariatrics

## 2022-05-28 ENCOUNTER — Ambulatory Visit: Payer: Federal, State, Local not specified - PPO | Admitting: Pulmonary Disease

## 2022-05-28 VITALS — BP 120/72 | HR 74 | Temp 97.7°F | Ht 67.0 in | Wt 208.2 lb

## 2022-05-28 DIAGNOSIS — I428 Other cardiomyopathies: Secondary | ICD-10-CM

## 2022-05-28 DIAGNOSIS — R0602 Shortness of breath: Secondary | ICD-10-CM

## 2022-05-28 DIAGNOSIS — F5089 Other specified eating disorder: Secondary | ICD-10-CM

## 2022-05-28 DIAGNOSIS — J4530 Mild persistent asthma, uncomplicated: Secondary | ICD-10-CM

## 2022-05-28 NOTE — Progress Notes (Signed)
Subjective:    Patient ID: Michele [name], female    DOB: 1991/04/29, 31 y.o.   MRN: [id number] Patient Care Team: Einar Pheasant, MD as PCP - General (Internal Medicine)  Chief Complaint  Patient presents with   Follow-up    No current sx.    HPI Michele [name] is a 31 year old lifelong never smoker is for follow-up on mild persistent asthma with seasonal variation.  She was last evaluated here on 26 March 2022.  At that time was started on Esmeralda.  Since that time she has noted that her episodes of "restricted breathing" are almost nonexistent.  She has only had to use her levo albuterol twice since her last visit.  She does note some occasional "restricted breathing" after exercise which would go with her asthma diagnosis.  But even these are occurring less and less.  She has not had any fevers, chills or sweats.  No chest pain.  No cough or sputum production.  No hemoptysis.  No lower extremity edema or calf tenderness.  PFTs performed 07 March 2022 were essentially normal overall however she did have small airways component which is consistent with asthma.  Overall she feels well and looks well.   Review of Systems A 10 point review of systems was performed and it is as noted above otherwise negative.  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   Anemia 11/26/2020   Rectal bleeding 02/08/2020   Increased thirst 07/17/2019   Hair loss 10/12/2018   Menstrual changes 10/12/2018   Low back pain 05/04/2016   Encounter for completion of form with patient 07/31/2015   Health care maintenance 01/31/2015   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   Cholecalciferol 25 MCG (1000 UT) capsule Take 1,000 Units by mouth daily.   Coenzyme Q10 (COQ-10 PO) Take by mouth.   COLLAGEN PO Take by mouth.   Fluticasone Furoate (ARNUITY ELLIPTA) 100 MCG/ACT AEPB Inhale 1 puff into the lungs daily.   levalbuterol (XOPENEX HFA) 45 MCG/ACT inhaler Inhale 2 puffs into the lungs every 6 (six) hours as needed for wheezing.   Magnesium 500 MG TABS Take 1,000 mg by mouth daily.   metoprolol succinate (TOPROL-XL) 50 MG 24 hr tablet Take 50 mg by mouth daily. Take with or immediately following a meal.   Multiple Vitamin (MULTIVITAMIN ADULT PO) Take by mouth.   Omega-3 Fatty Acids (FISH OIL) 1000 MG CAPS Take 1 capsule by mouth daily.   omeprazole (PRILOSEC) 20 MG capsule Take 20 mg by mouth as needed.   psyllium (METAMUCIL) 58.6 % powder Take 1 packet by mouth daily.   Immunization History  Administered Date(s) Administered   DTaP 03/17/1990, 05/26/1990,  09/03/1990, 03/02/1992, 03/25/1995   Hepatitis A 09/23/2006, 01/13/2008   HiB (PRP-OMP) 03/17/1990, 05/26/1990, 09/03/1990   Influenza Split 12/05/2013, 09/25/2014   Influenza,inj,Quad PF,6+ Mos 09/19/2017, 09/04/2020, 08/28/2021   Influenza-Unspecified 08/24/2018, 09/10/2019   MMR 02/11/1991, 05/31/1991, 03/25/1995   Moderna Sars-Covid-2 Vaccination 12/31/2019, 01/28/2020   OPV 03/17/1990, 05/26/1990, 03/25/1995   PFIZER(Purple Top)SARS-COV-2 Vaccination 01/17/2021   PPD Test 12/11/2015, 01/08/2016   Td 11/30/2004   Tdap 01/24/2014, 08/07/2020      Objective:    Physical Exam BP 120/72 (BP Location: Left Arm, Cuff Size: Normal)   Pulse 74   Temp 97.7 F (36.5 C) (Temporal)   Ht _0  (1.702 m)   Wt 208 lb 3.2 oz (94.4 kg)   SpO2 98%   BMI 32.61 kg/m  GENERAL: Overweight woman, no acute distress, fully ambulatory.  No conversational dyspnea. HEAD: Normocephalic, atraumatic.  EYES: Pupils equal, round, reactive to light.  No scleral icterus.  MOUTH: Nose/mouth/throat not examined due to masking requirements for COVID 19. NECK: Supple. No thyromegaly. Trachea midline. No JVD.  No adenopathy. PULMONARY: Good air entry bilaterally.  No adventitious sounds. CARDIOVASCULAR: S1 and S2. Regular rate and rhythm.  No rubs, murmurs or gallops heard. ABDOMEN: Benign. MUSCULOSKELETAL: No joint deformity, no clubbing, no edema.  NEUROLOGIC: No overt focal deficit, no gait disturbance, speech is fluent. SKIN: Intact,warm,dry. PSYCH: Mood and behavior normal       Assessment & Plan:     ICD-10-CM   1. Mild persistent asthma without complication - seasonal variation  J45.30    Continue Arnuity Ellipta 100 mcg, 1 puff daily Continue as needed levo albuterol Follow-up 6 months or as needed    2. 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  She has mild persistent asthma with seasonal variation but has been well compensated with Arnuity and as needed levo albuterol.  Levo albuterol being utilized due to her issues with PVCs prior.  Renold Don, MD Advanced [facility]ch[facility] P[facility]  [facility]    *This note was dictated using voice recognition software/Dragon.   Despite best efforts to proofread, errors can occur which can change the meaning. Any transcriptional errors that result from this process are unintentional and may not be fully corrected at the time of dictation.

## 2022-05-28 NOTE — Patient Instructions (Signed)
Continue taking Arnuity 1 puff daily   Continue using your rescue inhaler as needed.   We will see you in follow-up in 6 months time call sooner should any new problems arise.

## 2022-06-03 ENCOUNTER — Ambulatory Visit (INDEPENDENT_AMBULATORY_CARE_PROVIDER_SITE_OTHER): Payer: Federal, State, Local not specified - PPO | Admitting: Bariatrics

## 2022-06-03 ENCOUNTER — Encounter (INDEPENDENT_AMBULATORY_CARE_PROVIDER_SITE_OTHER): Payer: Self-pay | Admitting: Bariatrics

## 2022-06-03 VITALS — BP 111/69 | HR 82 | Temp 97.8°F | Ht 67.0 in | Wt 205.0 lb

## 2022-06-03 DIAGNOSIS — G43809 Other migraine, not intractable, without status migrainosus: Secondary | ICD-10-CM

## 2022-06-03 DIAGNOSIS — E7800 Pure hypercholesterolemia, unspecified: Secondary | ICD-10-CM

## 2022-06-03 DIAGNOSIS — Z6832 Body mass index (BMI) 32.0-32.9, adult: Secondary | ICD-10-CM

## 2022-06-03 DIAGNOSIS — E669 Obesity, unspecified: Secondary | ICD-10-CM

## 2022-06-03 DIAGNOSIS — Z6834 Body mass index (BMI) 34.0-34.9, adult: Secondary | ICD-10-CM

## 2022-06-03 NOTE — Progress Notes (Signed)
Chief Complaint:   OBESITY Michele [name] is here to discuss her progress with her obesity treatment plan along with follow-up of her obesity related diagnoses. Michele [name] is on the Category 3 Plan and states she is following her eating plan approximately 85% of the time. Michele [name] states she is doing cardio and weight lifting for 30 minutes 6 times per week.  Today's visit was #: 4 Starting weight: 220 lbs Starting date: 04/08/2022 Today's weight: 205 lbs Today's date: 06/03/2022 Total lbs lost to date: 15 lbs Total lbs lost since last in-office visit: 4 lbs  Interim History: Michele [name] is down 4 lbs since her last visit and she is doing well overall. She has had more events.   Subjective:   1. Hypercholesterolemia Michele [name] is taking fish oil currently.   2. Other migraine without status migrainosus, not intractable Michele [name] is not taking medications at this time. Her migraine has decreased.   Assessment/Plan:   1. Hypercholesterolemia Cardiovascular risk and specific lipid/LDL goals reviewed. Michele [name] will continue fish oil. She will have no trans fats.  We discussed several lifestyle modifications today and Michele [name] will continue to work on diet, exercise and weight loss efforts. Orders and follow up as documented in patient record.   Counseling Intensive lifestyle modifications are the first line treatment for this issue. Dietary changes: Increase soluble fiber. Decrease simple carbohydrates. Exercise changes: Moderate to vigorous-intensity aerobic activity 150 minutes per week if tolerated. Lipid-lowering medications: see documented in medical record.  2. Other migraine without status migrainosus, not intractable Michele [name] will follow up with her primary care physician.   3. Obesity, Current BMI 32.2 Michele [name] is currently in the action stage of change. As such, her goal is to continue with weight loss efforts. She has agreed to the Category 3 Plan.   Michele [name] will continue meal planning and she will continue  intentional eating.   Exercise goals:  As is.   Behavioral modification strategies: increasing lean protein intake, decreasing simple carbohydrates, increasing vegetables, increasing water intake, decreasing eating out, no skipping meals, meal planning and cooking strategies, keeping healthy foods in the home, and planning for success.  Michele [name] has agreed to follow-up with our clinic in 2-3 weeks. She was informed of the importance of frequent follow-up visits to maximize her success with intensive lifestyle modifications for her multiple health conditions.   Objective:   Blood pressure 111/69, pulse 82, temperature 97.8 F (36.6 C), height 5\' 7"  (1.702 m), weight 205 lb (93 kg), SpO2 97 %. Body mass index is 32.11 kg/m.  General: Cooperative, alert, well developed, in no acute distress. HEENT: Conjunctivae and lids unremarkable. Cardiovascular: Regular rhythm.  Lungs: Normal work of breathing. Neurologic: No focal deficits.   Lab Results  Component Value Date   CREATININE 0.80 04/08/2022   BUN 9 04/08/2022   NA 143 04/08/2022   K 4.5 04/08/2022   CL 102 04/08/2022   CO2 23 04/08/2022   Lab Results  Component Value Date   ALT 12 04/08/2022   AST 14 04/08/2022   ALKPHOS 67 04/08/2022   BILITOT 0.5 04/08/2022   Lab Results  Component Value Date   HGBA1C 5.3 04/08/2022   HGBA1C 5.1 07/21/2019   HGBA1C 5.0 08/04/2016   Lab Results  Component Value Date   INSULIN 8.7 04/08/2022   Lab Results  Component Value Date   TSH 2.120 04/08/2022   Lab Results  Component Value Date   CHOL 212 (H) 04/08/2022   HDL 63 04/08/2022   [place]  133 (H) 04/08/2022   TRIG 91 04/08/2022   CHOLHDL 3 08/28/2021   Lab Results  Component Value Date   VD25OH 40.5 04/08/2022   Lab Results  Component Value Date   WBC 6.8 02/12/2022   HGB 13.8 02/12/2022   HCT 42.3 02/12/2022   MCV 94.8 02/12/2022   PLT 279 02/12/2022   No results found for: "IRON", "TIBC", "FERRITIN"  Attestation  Statements:   Reviewed by clinician on day of visit: allergies, medications, problem list, medical history, surgical history, family history, social history, and previous encounter notes.  I, Lizbeth Bark, RMA, am acting as Location manager for [employer], DO.  I have reviewed the above documentation for accuracy and completeness, and I agree with the above. Jearld Lesch, DO

## 2022-06-05 DIAGNOSIS — N762 Acute vulvitis: Secondary | ICD-10-CM | POA: Diagnosis not present

## 2022-06-05 DIAGNOSIS — N760 Acute vaginitis: Secondary | ICD-10-CM | POA: Diagnosis not present

## 2022-06-09 ENCOUNTER — Encounter (INDEPENDENT_AMBULATORY_CARE_PROVIDER_SITE_OTHER): Payer: Self-pay | Admitting: Bariatrics

## 2022-06-18 ENCOUNTER — Encounter (INDEPENDENT_AMBULATORY_CARE_PROVIDER_SITE_OTHER): Payer: Self-pay | Admitting: Nurse Practitioner

## 2022-06-18 ENCOUNTER — Ambulatory Visit (INDEPENDENT_AMBULATORY_CARE_PROVIDER_SITE_OTHER): Payer: Federal, State, Local not specified - PPO | Admitting: Nurse Practitioner

## 2022-06-18 VITALS — BP 121/80 | HR 63 | Temp 98.1°F | Ht 67.0 in | Wt 201.0 lb

## 2022-06-18 DIAGNOSIS — Z6831 Body mass index (BMI) 31.0-31.9, adult: Secondary | ICD-10-CM | POA: Diagnosis not present

## 2022-06-18 DIAGNOSIS — E7849 Other hyperlipidemia: Secondary | ICD-10-CM

## 2022-06-18 DIAGNOSIS — E669 Obesity, unspecified: Secondary | ICD-10-CM

## 2022-06-18 NOTE — Progress Notes (Signed)
Chief Complaint:   OBESITY Michele [name] is here to discuss her progress with her obesity treatment plan along with follow-up of her obesity related diagnoses. Michele [name] is on the Category 3 Plan and states she is following her eating plan approximately 85% of the time. Michele [name] states she is doing cardio, weight lifting videos 30 minutes 6 times per week.  Today's visit was #: 5 Starting weight: 220 lbs Starting date: 04/08/2022 Today's weight: 201 lbs Today's date: 06/18/2022 Total lbs lost to date: 19 lbs Total lbs lost since last in-office visit: 4 lbs  Interim History: Michele [name] has done well with weight loss.  She is doing well with the Category 3 plan.  She has been drinking coffee, seltzer water and water daily.  She is meeting protein goals.  For exercise she has been doing beach body 21 day fix 6 days per week.  She denies any hunger, but struggles with cravings.   Subjective:   1. Other hyperlipidemia Michele [name] is currently not on any medications.  Assessment/Plan:   1. Other hyperlipidemia Cardiovascular risk and specific lipid/LDL goals reviewed.  We discussed several lifestyle modifications today and Michele [name] will continue to work on diet, exercise and weight loss efforts. Orders and follow up as documented in patient record.   Counseling Intensive lifestyle modifications are the first line treatment for this issue. Dietary changes: Increase soluble fiber. Decrease simple carbohydrates. Exercise changes: Moderate to vigorous-intensity aerobic activity 150 minutes per week if tolerated. Lipid-lowering medications: see documented in medical record.   2. Obesity, Current BMI 31.6 We discussed different breakfast options.  Michele [name] is currently in the action stage of change. As such, her goal is to continue with weight loss efforts. She has agreed to the Category 3 Plan.   Exercise goals:  As is.  Behavioral modification strategies: increasing water intake, meal planning and cooking  strategies, and planning for success.  Michele [name] has agreed to follow-up with our clinic in 3 weeks. She was informed of the importance of frequent follow-up visits to maximize her success with intensive lifestyle modifications for her multiple health conditions.   Objective:   Blood pressure 121/80, pulse 63, temperature 98.1 F (36.7 C), height 5\' 7"  (1.702 m), weight 201 lb (91.2 kg), SpO2 100 %. Body mass index is 31.48 kg/m.  General: Cooperative, alert, well developed, in no acute distress. HEENT: Conjunctivae and lids unremarkable. Cardiovascular: Regular rhythm.  Lungs: Normal work of breathing. Neurologic: No focal deficits.   Lab Results  Component Value Date   CREATININE 0.80 04/08/2022   BUN 9 04/08/2022   NA 143 04/08/2022   K 4.5 04/08/2022   CL 102 04/08/2022   CO2 23 04/08/2022   Lab Results  Component Value Date   ALT 12 04/08/2022   AST 14 04/08/2022   ALKPHOS 67 04/08/2022   BILITOT 0.5 04/08/2022   Lab Results  Component Value Date   HGBA1C 5.3 04/08/2022   HGBA1C 5.1 07/21/2019   HGBA1C 5.0 08/04/2016   Lab Results  Component Value Date   INSULIN 8.7 04/08/2022   Lab Results  Component Value Date   TSH 2.120 04/08/2022   Lab Results  Component Value Date   CHOL 212 (H) 04/08/2022   HDL 63 04/08/2022   LDLCALC 133 (H) 04/08/2022   TRIG 91 04/08/2022   CHOLHDL 3 08/28/2021   Lab Results  Component Value Date   VD25OH 40.5 04/08/2022   Lab Results  Component Value Date   WBC 6.8 02/12/2022  HGB 13.8 02/12/2022   HCT 42.3 02/12/2022   MCV 94.8 02/12/2022   PLT 279 02/12/2022   No results found for: "IRON", "TIBC", "FERRITIN"  Attestation Statements:   Reviewed by clinician on day of visit: allergies, medications, problem list, medical history, surgical history, family history, social history, and previous encounter notes.  Spent 30 minutes with the patient and reviewing chart prior to and after seeing the patient  I, Malcolm Metro, RMA, am acting as transcriptionist for Irene Limbo, FNP  I have reviewed the above documentation for accuracy and completeness, and I agree with the above. Irene Limbo, FNP

## 2022-07-03 DIAGNOSIS — K580 Irritable bowel syndrome with diarrhea: Secondary | ICD-10-CM | POA: Diagnosis not present

## 2022-07-03 DIAGNOSIS — K219 Gastro-esophageal reflux disease without esophagitis: Secondary | ICD-10-CM | POA: Diagnosis not present

## 2022-07-03 DIAGNOSIS — K648 Other hemorrhoids: Secondary | ICD-10-CM | POA: Diagnosis not present

## 2022-07-03 DIAGNOSIS — K921 Melena: Secondary | ICD-10-CM | POA: Diagnosis not present

## 2022-07-09 ENCOUNTER — Encounter (INDEPENDENT_AMBULATORY_CARE_PROVIDER_SITE_OTHER): Payer: Self-pay | Admitting: Nurse Practitioner

## 2022-07-09 ENCOUNTER — Ambulatory Visit (INDEPENDENT_AMBULATORY_CARE_PROVIDER_SITE_OTHER): Payer: Federal, State, Local not specified - PPO | Admitting: Nurse Practitioner

## 2022-07-09 VITALS — BP 132/79 | HR 69 | Temp 98.1°F | Ht 67.0 in | Wt 200.0 lb

## 2022-07-09 DIAGNOSIS — Z6831 Body mass index (BMI) 31.0-31.9, adult: Secondary | ICD-10-CM | POA: Diagnosis not present

## 2022-07-09 DIAGNOSIS — E669 Obesity, unspecified: Secondary | ICD-10-CM

## 2022-07-09 DIAGNOSIS — I493 Ventricular premature depolarization: Secondary | ICD-10-CM

## 2022-07-10 NOTE — Progress Notes (Signed)
Chief Complaint:   OBESITY Michele [name] is here to discuss her progress with her obesity treatment plan along with follow-up of her obesity related diagnoses. Michele [name] is on the Category 3 Plan and states she is following her eating plan approximately 75% of the time. Michele [name] states she is doing cardio, weight lifting and yoga 30 minutes 7 times per week.  Today's visit was #: 6 Starting weight: 220 lbs Starting date: 04/08/2022 Today's weight: 200 lbs Today's date: 07/09/2022 Total lbs lost to date: 20 lbs Total lbs lost since last in-office visit: 1  Interim History: Michele [name] overall has done well with weight loss. Doing well with Cat 3 plan. Some hunger and always craving sweets, eating sweets 3 days a week. She is eating out 3 times a week. She is drinking protein shakes, water, coffee and seltzer water.  Subjective:   1. Frequent PVCs Michele [name] is taking Toprol XL. Denies any side effects and any chest pain,shortness of breath or palpitations. Seeing cardio on a yearly basis.   Assessment/Plan:   1. Frequent PVCs Michele [name] will continue to follow up with cardio and continue medications as directed.  2. Obesity, Current BMI 31.4 Michele [name] is currently in the action stage of change. As such, her goal is to continue with weight loss efforts. She has agreed to the Category 3 Plan.   Exercise goals: As is.  Behavioral modification strategies: increasing lean protein intake, increasing water intake, and planning for success.  Michele [name] has agreed to follow-up with our clinic in 2 weeks. She was informed of the importance of frequent follow-up visits to maximize her success with intensive lifestyle modifications for her multiple health conditions.   Objective:   Blood pressure 132/79, pulse 69, temperature 98.1 F (36.7 C), height 5\' 7"  (1.702 m), weight 200 lb (90.7 kg), SpO2 100 %. Body mass index is 31.32 kg/m.  General: Cooperative, alert, well developed, in no acute distress. HEENT: Conjunctivae  and lids unremarkable. Cardiovascular: Regular rhythm.  Lungs: Normal work of breathing. Neurologic: No focal deficits.   Lab Results  Component Value Date   CREATININE 0.80 04/08/2022   BUN 9 04/08/2022   NA 143 04/08/2022   K 4.5 04/08/2022   CL 102 04/08/2022   CO2 23 04/08/2022   Lab Results  Component Value Date   ALT 12 04/08/2022   AST 14 04/08/2022   ALKPHOS 67 04/08/2022   BILITOT 0.5 04/08/2022   Lab Results  Component Value Date   HGBA1C 5.3 04/08/2022   HGBA1C 5.1 07/21/2019   HGBA1C 5.0 08/04/2016   Lab Results  Component Value Date   INSULIN 8.7 04/08/2022   Lab Results  Component Value Date   TSH 2.120 04/08/2022   Lab Results  Component Value Date   CHOL 212 (H) 04/08/2022   HDL 63 04/08/2022   LDLCALC 133 (H) 04/08/2022   TRIG 91 04/08/2022   CHOLHDL 3 08/28/2021   Lab Results  Component Value Date   VD25OH 40.5 04/08/2022   Lab Results  Component Value Date   WBC 6.8 02/12/2022   HGB 13.8 02/12/2022   HCT 42.3 02/12/2022   MCV 94.8 02/12/2022   PLT 279 02/12/2022   No results found for: "IRON", "TIBC", "FERRITIN"  Attestation Statements:   Reviewed by clinician on day of visit: allergies, medications, problem list, medical history, surgical history, family history, social history, and previous encounter notes.  Time spent on visit including pre-visit chart review and post-visit care and charting was 30 minutes.  I, Brendell Tyus, RMA, am acting as transcriptionist for Irene Limbo, FNP.  I have reviewed the above documentation for accuracy and completeness, and I agree with the above. Irene Limbo, FNP

## 2022-07-16 DIAGNOSIS — L820 Inflamed seborrheic keratosis: Secondary | ICD-10-CM | POA: Diagnosis not present

## 2022-07-16 DIAGNOSIS — L988 Other specified disorders of the skin and subcutaneous tissue: Secondary | ICD-10-CM | POA: Diagnosis not present

## 2022-07-16 DIAGNOSIS — L538 Other specified erythematous conditions: Secondary | ICD-10-CM | POA: Diagnosis not present

## 2022-07-22 DIAGNOSIS — H1031 Unspecified acute conjunctivitis, right eye: Secondary | ICD-10-CM | POA: Diagnosis not present

## 2022-07-24 ENCOUNTER — Ambulatory Visit (INDEPENDENT_AMBULATORY_CARE_PROVIDER_SITE_OTHER): Payer: Federal, State, Local not specified - PPO | Admitting: Nurse Practitioner

## 2022-07-24 ENCOUNTER — Encounter (INDEPENDENT_AMBULATORY_CARE_PROVIDER_SITE_OTHER): Payer: Self-pay | Admitting: Nurse Practitioner

## 2022-07-24 VITALS — BP 127/80 | HR 71 | Temp 98.1°F | Ht 67.0 in | Wt 198.0 lb

## 2022-07-24 DIAGNOSIS — E7849 Other hyperlipidemia: Secondary | ICD-10-CM

## 2022-07-24 DIAGNOSIS — E669 Obesity, unspecified: Secondary | ICD-10-CM

## 2022-07-24 DIAGNOSIS — Z6831 Body mass index (BMI) 31.0-31.9, adult: Secondary | ICD-10-CM | POA: Diagnosis not present

## 2022-07-24 DIAGNOSIS — E041 Nontoxic single thyroid nodule: Secondary | ICD-10-CM

## 2022-07-28 NOTE — Progress Notes (Signed)
Chief Complaint:   OBESITY Michele [name] is here to discuss her progress with her obesity treatment plan along with follow-up of her obesity related diagnoses. Michele [name] is on the Category 3 Plan and states she is following her eating plan approximately 85% of the time. Michele [name] states she is weight lifting/cardio 40 minutes 6 times per week.  Today's visit was #: 7 Starting weight: 220 lbs Starting date: 04/08/2022 Today's weight: 198 lbs Today's date: 07/24/2022 Total lbs lost to date: 22 lbs Total lbs lost since last in-office visit: 2  Interim History: Michele [name] has done well with weight loss. Trying to eat better and not snack as much and not eating out as much. She is doing better with cravings. Has not been eating as many desserts. Drinking a protein shake as a snack. Drinking water, coffee and seltzer water. She started [employer] [facility].  Subjective:   1. Other hyperlipidemia Aowyn has never been on medication. Family history: Father and Mother.  Assessment/Plan:   1. Other hyperlipidemia Cardiovascular risk and specific lipid/LDL goals reviewed.  We discussed several lifestyle modifications today and Francheska will continue to work on diet, exercise and weight loss efforts. Orders and follow up as documented in patient record.   Counseling Intensive lifestyle modifications are the first line treatment for this issue. Dietary changes: Increase soluble fiber. Decrease simple carbohydrates. Exercise changes: Moderate to vigorous-intensity aerobic activity 150 minutes per week if tolerated. Lipid-lowering medications: see documented in medical record.  2. Obesity, Current BMI 31.1 Michele [name] is currently in the action stage of change. As such, her goal is to continue with weight loss efforts. She has agreed to the Category 3 Plan.   Exercise goals: As is.  Behavioral modification strategies: increasing lean protein intake, increasing vegetables, and increasing water intake.  Michele [name] has agreed  to follow-up with our clinic in 3 weeks. She was informed of the importance of frequent follow-up visits to maximize her success with intensive lifestyle modifications for her multiple health conditions.   Objective:   Blood pressure 127/80, pulse 71, temperature 98.1 F (36.7 C), height 5\' 7"  (1.702 m), weight 198 lb (89.8 kg), SpO2 100 %. Body mass index is 31.01 kg/m.  General: Cooperative, alert, well developed, in no acute distress. HEENT: Conjunctivae and lids unremarkable. Cardiovascular: Regular rhythm.  Lungs: Normal work of breathing. Neurologic: No focal deficits.   Lab Results  Component Value Date   CREATININE 0.80 04/08/2022   BUN 9 04/08/2022   NA 143 04/08/2022   K 4.5 04/08/2022   CL 102 04/08/2022   CO2 23 04/08/2022   Lab Results  Component Value Date   ALT 12 04/08/2022   AST 14 04/08/2022   ALKPHOS 67 04/08/2022   BILITOT 0.5 04/08/2022   Lab Results  Component Value Date   HGBA1C 5.3 04/08/2022   HGBA1C 5.1 07/21/2019   HGBA1C 5.0 08/04/2016   Lab Results  Component Value Date   INSULIN 8.7 04/08/2022   Lab Results  Component Value Date   TSH 2.120 04/08/2022   Lab Results  Component Value Date   CHOL 212 (H) 04/08/2022   HDL 63 04/08/2022   LDLCALC 133 (H) 04/08/2022   TRIG 91 04/08/2022   CHOLHDL 3 08/28/2021   Lab Results  Component Value Date   VD25OH 40.5 04/08/2022   Lab Results  Component Value Date   WBC 6.8 02/12/2022   HGB 13.8 02/12/2022   HCT 42.3 02/12/2022   MCV 94.8 02/12/2022  PLT 279 02/12/2022   No results found for: "IRON", "TIBC", "FERRITIN"  Attestation Statements:   Reviewed by clinician on day of visit: allergies, medications, problem list, medical history, surgical history, family history, social history, and previous encounter notes.  Time spent on visit including pre-visit chart review and post-visit care and charting was 30 minutes.   I, Brendell Tyus, RMA, am acting as transcriptionist for  Irene Limbo, FNP.  I have reviewed the above documentation for accuracy and completeness, and I agree with the above. Irene Limbo, FNP

## 2022-07-30 ENCOUNTER — Encounter (INDEPENDENT_AMBULATORY_CARE_PROVIDER_SITE_OTHER): Payer: Self-pay

## 2022-07-30 DIAGNOSIS — H0012 Chalazion right lower eyelid: Secondary | ICD-10-CM | POA: Diagnosis not present

## 2022-08-20 ENCOUNTER — Ambulatory Visit (INDEPENDENT_AMBULATORY_CARE_PROVIDER_SITE_OTHER): Payer: Federal, State, Local not specified - PPO | Admitting: Nurse Practitioner

## 2022-08-20 ENCOUNTER — Encounter (INDEPENDENT_AMBULATORY_CARE_PROVIDER_SITE_OTHER): Payer: Self-pay | Admitting: Nurse Practitioner

## 2022-08-20 VITALS — BP 115/76 | HR 75 | Temp 98.1°F | Ht 67.0 in | Wt 194.0 lb

## 2022-08-20 DIAGNOSIS — E7849 Other hyperlipidemia: Secondary | ICD-10-CM

## 2022-08-20 DIAGNOSIS — Z6830 Body mass index (BMI) 30.0-30.9, adult: Secondary | ICD-10-CM

## 2022-08-20 DIAGNOSIS — E669 Obesity, unspecified: Secondary | ICD-10-CM | POA: Diagnosis not present

## 2022-08-21 ENCOUNTER — Encounter: Payer: Self-pay | Admitting: Pulmonary Disease

## 2022-08-21 NOTE — Telephone Encounter (Signed)
She may try getting off of it.  If she notes that she has to increase the use of her rescue inhaler we may need to reassess and resume taking the daily controller medication (Arnuity).

## 2022-08-21 NOTE — Telephone Encounter (Signed)
Dr. Gonzalez, please advise. Thanks 

## 2022-08-25 NOTE — Progress Notes (Signed)
Chief Complaint:   OBESITY Michele [name] is here to discuss her progress with her obesity treatment plan along with follow-up of her obesity related diagnoses. Michele [name] is on the Category 3 Plan and states she is following her eating plan approximately 90% of the time. Michele [name] states she is weight lifting, cardio 40 minutes 6 times per week.  Today's visit was #: 8 Starting weight: 220 lbs Starting date: 04/08/2022 Today's weight: 194 lbs Today's date: 08/20/2022 Total lbs lost to date: 26 lbs Total lbs lost since last in-office visit: 4 lbs  Interim History: Has done well with weight loss.  Drinking water, coffee and seltzer water.  Denies hunger or occasional cravings.  Doing beach body workouts 6 days per week.   Breakfast:  3 eggs with toast Lunch:  4 oz of meat sandwich, apple and yogurt Dinner:  8 oz meat, veggies, Snack:  protein shake  Subjective:   1. Other hyperlipidemia Has never been on medications.  Family history:  father & mother  Assessment/Plan:   1. Other hyperlipidemia Cardiovascular risk and specific lipid/LDL goals reviewed.  We discussed several lifestyle modifications today and Michele [name] will continue to work on diet, exercise and weight loss efforts. Orders and follow up as documented in patient record.   Counseling Intensive lifestyle modifications are the first line treatment for this issue. Dietary changes: Increase soluble fiber. Decrease simple carbohydrates. Exercise changes: Moderate to vigorous-intensity aerobic activity 150 minutes per week if tolerated. Lipid-lowering medications: see documented in medical record.  2. Obesity, Current BMI 30.4 Michele [name] is currently in the action stage of change. As such, her goal is to continue with weight loss efforts. She has agreed to the Category 3 Plan.   Exercise goals:  as is.   Behavioral modification strategies: increasing lean protein intake, increasing vegetables, and increasing water intake.  Michele [name] has  agreed to follow-up with our clinic in 4 weeks. She was informed of the importance of frequent follow-up visits to maximize her success with intensive lifestyle modifications for her multiple health conditions.   Objective:   Blood pressure 115/76, pulse 75, temperature 98.1 F (36.7 C), height 5\' 7"  (1.702 m), weight 194 lb (88 kg), SpO2 100 %. Body mass index is 30.38 kg/m.  General: Cooperative, alert, well developed, in no acute distress. HEENT: Conjunctivae and lids unremarkable. Cardiovascular: Regular rhythm.  Lungs: Normal work of breathing. Neurologic: No focal deficits.   Lab Results  Component Value Date   CREATININE 0.80 04/08/2022   BUN 9 04/08/2022   NA 143 04/08/2022   K 4.5 04/08/2022   CL 102 04/08/2022   CO2 23 04/08/2022   Lab Results  Component Value Date   ALT 12 04/08/2022   AST 14 04/08/2022   ALKPHOS 67 04/08/2022   BILITOT 0.5 04/08/2022   Lab Results  Component Value Date   HGBA1C 5.3 04/08/2022   HGBA1C 5.1 07/21/2019   HGBA1C 5.0 08/04/2016   Lab Results  Component Value Date   INSULIN 8.7 04/08/2022   Lab Results  Component Value Date   TSH 2.120 04/08/2022   Lab Results  Component Value Date   CHOL 212 (H) 04/08/2022   HDL 63 04/08/2022   LDLCALC 133 (H) 04/08/2022   TRIG 91 04/08/2022   CHOLHDL 3 08/28/2021   Lab Results  Component Value Date   VD25OH 40.5 04/08/2022   Lab Results  Component Value Date   WBC 6.8 02/12/2022   HGB 13.8 02/12/2022   HCT 42.3 02/12/2022  MCV 94.8 02/12/2022   PLT 279 02/12/2022   No results found for: "IRON", "TIBC", "FERRITIN"  Attestation Statements:   Reviewed by clinician on day of visit: allergies, medications, problem list, medical history, surgical history, family history, social history, and previous encounter notes.  I spent 30 minutes with the patient and reviewing the chart prior and after visit.   I, Malcolm Metro, RMA, am acting as transcriptionist for Irene Limbo, FNP  I have reviewed the above documentation for accuracy and completeness, and I agree with the above. Irene Limbo, FNP

## 2022-08-28 ENCOUNTER — Ambulatory Visit (INDEPENDENT_AMBULATORY_CARE_PROVIDER_SITE_OTHER): Payer: Federal, State, Local not specified - PPO | Admitting: Internal Medicine

## 2022-08-28 ENCOUNTER — Encounter: Payer: Self-pay | Admitting: Internal Medicine

## 2022-08-28 VITALS — BP 110/68 | HR 65 | Temp 97.4°F | Resp 17 | Ht 68.0 in | Wt 200.5 lb

## 2022-08-28 DIAGNOSIS — E041 Nontoxic single thyroid nodule: Secondary | ICD-10-CM

## 2022-08-28 DIAGNOSIS — E7800 Pure hypercholesterolemia, unspecified: Secondary | ICD-10-CM | POA: Diagnosis not present

## 2022-08-28 DIAGNOSIS — Z23 Encounter for immunization: Secondary | ICD-10-CM | POA: Diagnosis not present

## 2022-08-28 DIAGNOSIS — O903 Peripartum cardiomyopathy: Secondary | ICD-10-CM

## 2022-08-28 DIAGNOSIS — Z1159 Encounter for screening for other viral diseases: Secondary | ICD-10-CM

## 2022-08-28 DIAGNOSIS — R7989 Other specified abnormal findings of blood chemistry: Secondary | ICD-10-CM

## 2022-08-28 DIAGNOSIS — Z0000 Encounter for general adult medical examination without abnormal findings: Secondary | ICD-10-CM

## 2022-08-28 DIAGNOSIS — J45998 Other asthma: Secondary | ICD-10-CM

## 2022-08-28 DIAGNOSIS — Z713 Dietary counseling and surveillance: Secondary | ICD-10-CM

## 2022-08-28 DIAGNOSIS — I493 Ventricular premature depolarization: Secondary | ICD-10-CM

## 2022-08-28 DIAGNOSIS — D649 Anemia, unspecified: Secondary | ICD-10-CM

## 2022-08-28 LAB — LIPID PANEL
Cholesterol: 199 mg/dL (ref 0–200)
HDL: 50.2 mg/dL (ref >39.00)
LDL Cholesterol: 133 mg/dL — ABNORMAL HIGH (ref 0–99)
NonHDL: 148.89
Total CHOL/HDL Ratio: 4
Triglycerides: 80 mg/dL (ref 0.0–149.0)
VLDL: 16 mg/dL (ref 0.0–40.0)

## 2022-08-28 LAB — COMPREHENSIVE METABOLIC PANEL
ALT: 15 U/L (ref 0–35)
AST: 15 U/L (ref 0–37)
Albumin: 4.3 g/dL (ref 3.5–5.2)
Alkaline Phosphatase: 45 U/L (ref 39–117)
BUN: 16 mg/dL (ref 6–23)
CO2: 26 mEq/L (ref 19–32)
Calcium: 9 mg/dL (ref 8.4–10.5)
Chloride: 104 mEq/L (ref 96–112)
Creatinine, Ser: 0.81 mg/dL (ref 0.40–1.20)
GFR: 95.93 mL/min (ref >60.00)
Glucose, Bld: 81 mg/dL (ref 70–99)
Potassium: 3.9 mEq/L (ref 3.5–5.1)
Sodium: 138 mEq/L (ref 135–145)
Total Bilirubin: 0.7 mg/dL (ref 0.2–1.2)
Total Protein: 7.3 g/dL (ref 6.0–8.3)

## 2022-08-28 NOTE — Progress Notes (Signed)
Patient ID: Michele [name], female   DOB: [birth year], 31 y.o.   MRN: [id number]   Subjective:    Patient ID: Michele [name], female    DOB: [birth year], 31 y.o.   MRN: [id number]   Patient here for  Chief Complaint  Patient presents with   Annual Exam    CPE   .   HPI Here for physical exam.  Has been to weight management clinic - through [facility]. Has done well. Exercising.  Has seen pulmonary.  Breathing stable.  Off inhaler now.  No chest pain or sob reported.  No abdominal pain.  No acid reflux.  No urine or bowel change.  Handling stress.     Past Medical History:  Diagnosis Date   Arrhythmia    Arthritis    Asthma    Chest pain    CHF (congestive heart failure) (HCC)    GERD (gastroesophageal reflux disease)    History of chicken pox    Hx of blood clots    Hx of migraines    Hx: UTI (urinary tract infection)    Hypercholesteremia    Hyperlipidemia    Low back pain    Osteoarthritis    Palpitations    Postpartum cardiomyopathy    Sleep apnea    Thyroid nodule    Past Surgical History:  Procedure Laterality Date   ABLATION     TONSILLECTOMY AND ADENOIDECTOMY  12/23/1995   Family History  Problem Relation Age of Onset   Hypertension Mother    Diabetes Mother    High Cholesterol Mother    Hyperlipidemia Father    Hypertension Father    Breast cancer Maternal Grandmother    Prostate cancer Maternal Grandfather    Heart disease Maternal Grandfather    Hypertension Maternal Grandfather    Diabetes Maternal Grandfather    Breast cancer Paternal Grandmother    Hyperlipidemia Paternal Grandmother    Hypertension Paternal Grandmother    Diabetes Paternal Grandmother    Mental illness Paternal Grandmother    Hyperlipidemia Paternal Grandfather    Stroke Paternal Grandfather    Hypertension Paternal Grandfather    Social History   Socioeconomic History   Marital status: Married    Spouse name: Ronaldo Miyamoto   Number of children: 0   Years of education: Not on  file   Highest education level: Not on file  Occupational History   Occupation: PTA  Tobacco Use   Smoking status: Never   Smokeless tobacco: Never  Vaping Use   Vaping Use: Never used  Substance and Sexual Activity   Alcohol use: Yes    Alcohol/week: 0.0 standard drinks of alcohol   Drug use: No   Sexual activity: Yes  Other Topics Concern   Not on file  Social History Narrative   PT assistant at assisted living; lives in [place]; 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    Social Determinants of Health   Financial Resource Strain: Not on file  Food Insecurity: Not on file  Transportation Needs: Not on file  Physical Activity: Not on file  Stress: Not on file  Social Connections: Not on file     Review of Systems  Constitutional:  Negative for appetite change and unexpected weight change.  HENT:  Negative for congestion, sinus pressure and sore throat.   Eyes:  Negative for pain and visual disturbance.  Respiratory:  Negative for cough, chest tightness and shortness of breath.   Cardiovascular:  Negative for chest pain, palpitations and  leg swelling.  Gastrointestinal:  Negative for abdominal pain, diarrhea, nausea and vomiting.  Genitourinary:  Negative for difficulty urinating and dysuria.  Musculoskeletal:  Negative for back pain and joint swelling.  Skin:  Negative for color change and rash.  Neurological:  Negative for dizziness, light-headedness and headaches.  Hematological:  Negative for adenopathy. Does not bruise/bleed easily.  Psychiatric/Behavioral:  Negative for agitation and dysphoric mood.        Objective:     BP 110/68   Pulse 65   Temp (!) 97.4 F (36.3 C) (Oral)   Resp 17   Ht 5\' 8"  (1.727 m)   Wt 200 lb 8 oz (90.9 kg)   SpO2 99%   BMI 30.49 kg/m  Wt Readings from Last 3 Encounters:  08/28/22 200 lb 8 oz (90.9 kg)  08/20/22 194 lb (88 kg)  07/24/22 198 lb (89.8 kg)    Physical Exam Vitals reviewed.  Constitutional:       General: She is not in acute distress.    Appearance: Normal appearance. She is well-developed.  HENT:     Head: Normocephalic and atraumatic.     Right Ear: External ear normal.     Left Ear: External ear normal.  Eyes:     General: No scleral icterus.       Right eye: No discharge.        Left eye: No discharge.     Conjunctiva/sclera: Conjunctivae normal.  Neck:     Thyroid: No thyromegaly.  Cardiovascular:     Rate and Rhythm: Normal rate and regular rhythm.  Pulmonary:     Effort: No tachypnea, accessory muscle usage or respiratory distress.     Breath sounds: Normal breath sounds. No decreased breath sounds or wheezing.  Chest:  Breasts:    Right: No inverted nipple, mass, nipple discharge or tenderness (no axillary adenopathy).     Left: No inverted nipple, mass, nipple discharge or tenderness (no axilarry adenopathy).  Abdominal:     General: Bowel sounds are normal.     Palpations: Abdomen is soft.     Tenderness: There is no abdominal tenderness.  Musculoskeletal:        General: No swelling or tenderness.     Cervical back: Neck supple.  Lymphadenopathy:     Cervical: No cervical adenopathy.  Skin:    Findings: No erythema or rash.  Neurological:     Mental Status: She is alert and oriented to person, place, and time.  Psychiatric:        Mood and Affect: Mood normal.        Behavior: Behavior normal.      Outpatient Encounter Medications as of 08/28/2022  Medication Sig   acetaminophen (TYLENOL) 500 MG tablet Take by mouth.   Cholecalciferol 25 MCG (1000 UT) capsule Take 1,000 Units by mouth daily.   Coenzyme Q10 (COQ-10 PO) Take by mouth.   COLLAGEN PO Take by mouth.   Fluticasone Furoate (ARNUITY ELLIPTA) 100 MCG/ACT AEPB Inhale 1 puff into the lungs daily.   levalbuterol (XOPENEX HFA) 45 MCG/ACT inhaler Inhale 2 puffs into the lungs every 6 (six) hours as needed for wheezing.   Magnesium 500 MG TABS Take 1,000 mg by mouth daily.   metoprolol succinate  (TOPROL-XL) 50 MG 24 hr tablet Take 50 mg by mouth daily. Take with or immediately following a meal.   Multiple Vitamin (MULTIVITAMIN ADULT PO) Take by mouth.   Omega-3 Fatty Acids (FISH OIL) 1000 MG CAPS Take 1  capsule by mouth daily.   omeprazole (PRILOSEC) 20 MG capsule Take 20 mg by mouth as needed.   psyllium (METAMUCIL) 58.6 % powder Take 1 packet by mouth daily.   No facility-administered encounter medications on file as of 08/28/2022.     Lab Results  Component Value Date   WBC 6.8 02/12/2022   HGB 13.8 02/12/2022   HCT 42.3 02/12/2022   PLT 279 02/12/2022   GLUCOSE 81 08/28/2022   CHOL 199 08/28/2022   TRIG 80.0 08/28/2022   HDL 50.20 08/28/2022   LDLCALC 133 (H) 08/28/2022   ALT 15 08/28/2022   AST 15 08/28/2022   NA 138 08/28/2022   K 3.9 08/28/2022   CL 104 08/28/2022   CREATININE 0.81 08/28/2022   BUN 16 08/28/2022   CO2 26 08/28/2022   TSH 2.120 04/08/2022   HGBA1C 5.3 04/08/2022    CT Angio Chest Pulmonary Embolism (PE) W or WO Contrast  Result Date: 04/22/2022 CLINICAL DATA:  Shortness of breath, elevated D-dimer level. EXAM: CT ANGIOGRAPHY CHEST WITH CONTRAST TECHNIQUE: Multidetector CT imaging of the chest was performed using the standard protocol during bolus administration of intravenous contrast. Multiplanar CT image reconstructions and MIPs were obtained to evaluate the vascular anatomy. RADIATION DOSE REDUCTION: This exam was performed according to the departmental dose-optimization program which includes automated exposure control, adjustment of the mA and/or kV according to patient size and/or use of iterative reconstruction technique. CONTRAST:  89mL OMNIPAQUE IOHEXOL 350 MG/ML SOLN COMPARISON:  Radiograph of May 14, 2021. FINDINGS: Cardiovascular: Satisfactory opacification of the pulmonary arteries to the segmental level. No evidence of pulmonary embolism. Normal heart size. No pericardial effusion. Mediastinum/Nodes: No enlarged mediastinal, hilar, or  axillary lymph nodes. Thyroid gland, trachea, and esophagus demonstrate no significant findings. Lungs/Pleura: Lungs are clear. No pleural effusion or pneumothorax. Upper Abdomen: No acute abnormality. Musculoskeletal: No chest wall abnormality. No acute or significant osseous findings. Review of the MIP images confirms the above findings. IMPRESSION: No definite evidence of pulmonary embolus. No acute abnormality seen in the chest. Electronically Signed   By: Lupita Raider M.D.   On: 04/22/2022 12:04       Assessment & Plan:   Problem List Items Addressed This Visit     Anemia    Follow cbc.       Frequent PVCs    Continue toprol.  Stable.       Health care maintenance    Physical today 08/28/22.  Colonoscopy 04/23/21.  PAP 04/30/20 - pap negative with negative HPV.  Negative GC and chlamydia.        Hypercholesterolemia    Low cholesterol diet and exercise.  Follow lipid panel.       Relevant Orders   Comprehensive metabolic panel (Completed)   Lipid panel (Completed)   Positive D dimer    Previous positive d dimer.  She is doing well.  Feels good.  Request f/u d dimer check.       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  Recommended continuing metoprolol and echo in one year.       Seasonal asthma    Saw Dr Jayme Cloud 05/28/22 -previous trial of Arnuity Ellipta 100 mcg 1 puff daily. Continue Xopenex as needed.  Off inhaler now.  Breathing stable.       Thyroid nodule    Found on CT.  Saw Dr Gershon Crane in f/u.  Recommended  f/u end of year.        Weight loss counseling, encounter for    Has done well with diet and exercise.  Being followed - [place] [facility] loss clinic.  Follow.       Other Visit Diagnoses     Routine general medical examination at a health care facility    -  Primary   Need for immunization against influenza       Relevant Orders   Flu Vaccine  QUAD 30mo+IM (Fluarix, Fluzone & Alfiuria Quad PF) (Completed)   Encounter for hepatitis C screening test for low risk patient       Relevant Orders   Hepatitis C antibody (Completed)        Dale Wheatland, MD

## 2022-08-28 NOTE — Assessment & Plan Note (Addendum)
Physical today 08/28/22.  Colonoscopy 04/23/21.  PAP 04/30/20 - pap negative with negative HPV.  Negative GC and chlamydia.

## 2022-08-29 LAB — HEPATITIS C ANTIBODY: Hepatitis C Ab: NONREACTIVE

## 2022-08-29 LAB — D-DIMER, QUANTITATIVE: D-Dimer, Quant: 0.3 mcg/mL FEU (ref <0.50)

## 2022-09-07 ENCOUNTER — Encounter: Payer: Self-pay | Admitting: Internal Medicine

## 2022-09-07 NOTE — Assessment & Plan Note (Signed)
Continue toprol.  Stable.

## 2022-09-07 NOTE — Assessment & Plan Note (Signed)
Has done well with diet and exercise.  Being followed - [place] [facility].  Follow.

## 2022-09-07 NOTE — Assessment & Plan Note (Signed)
Found on CT.  Saw Dr Honor Junes in f/u.  Recommended f/u end of year.

## 2022-09-07 NOTE — Assessment & Plan Note (Signed)
Saw Dr Patsey Berthold 05/28/22 -previous trial of Arnuity Ellipta 100 mcg 1 puff daily. Continue Xopenex as needed.  Off inhaler now.  Breathing stable.

## 2022-09-07 NOTE — Assessment & Plan Note (Signed)
Low cholesterol diet and exercise.  Follow lipid panel.   

## 2022-09-07 NOTE — Assessment & Plan Note (Signed)
Previous positive d dimer.  She is doing well.  Feels good.  Request f/u d dimer check.

## 2022-09-07 NOTE — Assessment & Plan Note (Signed)
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  Recommended continuing metoprolol and echo in one year.

## 2022-09-07 NOTE — Assessment & Plan Note (Signed)
Follow cbc.  

## 2022-09-17 ENCOUNTER — Ambulatory Visit (INDEPENDENT_AMBULATORY_CARE_PROVIDER_SITE_OTHER): Payer: Federal, State, Local not specified - PPO | Admitting: Adult Health

## 2022-09-17 ENCOUNTER — Encounter (INDEPENDENT_AMBULATORY_CARE_PROVIDER_SITE_OTHER): Payer: Self-pay | Admitting: Adult Health

## 2022-09-17 VITALS — BP 113/74 | HR 59 | Temp 97.9°F | Ht 68.0 in | Wt 192.0 lb

## 2022-09-17 DIAGNOSIS — Z6830 Body mass index (BMI) 30.0-30.9, adult: Secondary | ICD-10-CM

## 2022-09-17 DIAGNOSIS — E7849 Other hyperlipidemia: Secondary | ICD-10-CM

## 2022-09-17 DIAGNOSIS — E669 Obesity, unspecified: Secondary | ICD-10-CM | POA: Diagnosis not present

## 2022-09-17 DIAGNOSIS — E041 Nontoxic single thyroid nodule: Secondary | ICD-10-CM

## 2022-09-17 DIAGNOSIS — Z6834 Body mass index (BMI) 34.0-34.9, adult: Secondary | ICD-10-CM

## 2022-09-19 DIAGNOSIS — E7849 Other hyperlipidemia: Secondary | ICD-10-CM | POA: Insufficient documentation

## 2022-09-19 NOTE — Progress Notes (Addendum)
Chief Complaint:   OBESITY Michele [name] is here to discuss her progress with her obesity treatment plan along with follow-up of her obesity related diagnoses. Michele [name] is on the Category 3 Plan and states she is following her eating plan approximately 70% of the time. Michele [name] states she is walking and lifting weights 40 minutes 7 times per week.  Today's visit was #: 9 Starting weight: 220 lbs Starting date: 04/08/2022 Today's weight: 192 lbs Today's date: 09/17/2022 Total lbs lost to date: 28 lbs Total lbs lost since last in-office visit: 2 lbs  Interim History:  08/28/2022 office visit with PCP/Dr Nicki Reaper- fasting labs completed.  Her brother came home for Rest and Recovery from [place], Engineer, drilling.     Michele [name] provided the following intake that is typical of a day: Breakfast:  eggs, toast, coffee with fair life milk Lunch:  4 oz deli meat, yogurt, apple Dinner:  lean meat, 2 cups vegetable Snacks:  goldfish, apple protein snacks, 120cal/20g  Ultimate goal weight 170-175 and to increase muscle mass.  Subjective:   1. Thyroid nodule Found on CT.   Annual follow up with Dr Honor Junes, Endocrinology.   She is scheduled for ultrasound November 2023.   She denies family history of MTC or MENS 2.  2. Other hyperlipidemia Both of her parents have a history of hyperlipidemia.   She recently removed egg yolks from breakfast.   She estimates 2 servings of red meat per week.   Lipid Panel     Component Value Date/Time   CHOL 199 08/28/2022 0830   CHOL 212 (H) 04/08/2022 0956   TRIG 80.0 08/28/2022 0830   HDL 50.20 08/28/2022 0830   HDL 63 04/08/2022 0956   CHOLHDL 4 08/28/2022 0830   VLDL 16.0 08/28/2022 0830   LDLCALC 133 (H) 08/28/2022 0830   LDLCALC 133 (H) 04/08/2022 0956   LABVLDL 16 04/08/2022 0956     Assessment/Plan:   1. Thyroid nodule Follow up with Endocrinology as directed.   2. Other hyperlipidemia Decrease red meat to max one time per week.   Continue  with regular exercise.   3. Obesity, Current BMI 30.1 Michele [name] is currently in the action stage of change. As such, her goal is to continue with weight loss efforts. She has agreed to the Category 3 Plan.   Exercise goals:  As is.   Behavioral modification strategies: increasing lean protein intake, decreasing simple carbohydrates, meal planning and cooking strategies, keeping healthy foods in the home, and planning for success.  Michele [name] has agreed to follow-up with our clinic in 3 weeks. She was informed of the importance of frequent follow-up visits to maximize her success with intensive lifestyle modifications for her multiple health conditions.   Objective:   Blood pressure 113/74, pulse (!) 59, temperature 97.9 F (36.6 C), height 5\' 8"  (1.727 m), weight 192 lb (87.1 kg), SpO2 99 %. Body mass index is 29.19 kg/m.  General: Cooperative, alert, well developed, in no acute distress. HEENT: Conjunctivae and lids unremarkable. Cardiovascular: Regular rhythm.  Lungs: Normal work of breathing. Neurologic: No focal deficits.   Lab Results  Component Value Date   CREATININE 0.81 08/28/2022   BUN 16 08/28/2022   NA 138 08/28/2022   K 3.9 08/28/2022   CL 104 08/28/2022   CO2 26 08/28/2022   Lab Results  Component Value Date   ALT 15 08/28/2022   AST 15 08/28/2022   ALKPHOS 45 08/28/2022   BILITOT 0.7 08/28/2022   Lab  Results  Component Value Date   HGBA1C 5.3 04/08/2022   HGBA1C 5.1 07/21/2019   HGBA1C 5.0 08/04/2016   Lab Results  Component Value Date   INSULIN 8.7 04/08/2022   Lab Results  Component Value Date   TSH 2.120 04/08/2022   Lab Results  Component Value Date   CHOL 199 08/28/2022   HDL 50.20 08/28/2022   LDLCALC 133 (H) 08/28/2022   TRIG 80.0 08/28/2022   CHOLHDL 4 08/28/2022   Lab Results  Component Value Date   VD25OH 40.5 04/08/2022   Lab Results  Component Value Date   WBC 6.8 02/12/2022   HGB 13.8 02/12/2022   HCT 42.3 02/12/2022   MCV 94.8  02/12/2022   PLT 279 02/12/2022   No results found for: "IRON", "TIBC", "FERRITIN"  Attestation Statements:   Reviewed by clinician on day of visit: allergies, medications, problem list, medical history, surgical history, family history, social history, and previous encounter notes.  I, Davy Pique, RMA, am acting as Location manager for Mina Marble, NP.  I have reviewed the above documentation for accuracy and completeness, and I agree with the above. -  Hanifa Antonetti d. Deloros Beretta, NP-C

## 2022-10-07 ENCOUNTER — Encounter (INDEPENDENT_AMBULATORY_CARE_PROVIDER_SITE_OTHER): Payer: Self-pay | Admitting: Adult Health

## 2022-10-07 ENCOUNTER — Ambulatory Visit (INDEPENDENT_AMBULATORY_CARE_PROVIDER_SITE_OTHER): Payer: Federal, State, Local not specified - PPO | Admitting: Adult Health

## 2022-10-07 VITALS — BP 137/83 | HR 67 | Temp 97.9°F | Ht 68.0 in | Wt 189.0 lb

## 2022-10-07 DIAGNOSIS — E669 Obesity, unspecified: Secondary | ICD-10-CM | POA: Diagnosis not present

## 2022-10-07 DIAGNOSIS — Z6828 Body mass index (BMI) 28.0-28.9, adult: Secondary | ICD-10-CM

## 2022-10-07 DIAGNOSIS — R632 Polyphagia: Secondary | ICD-10-CM

## 2022-10-14 DIAGNOSIS — R632 Polyphagia: Secondary | ICD-10-CM | POA: Insufficient documentation

## 2022-10-14 NOTE — Progress Notes (Signed)
Chief Complaint:   OBESITY Michele [name] is here to discuss her progress with her obesity treatment plan along with follow-up of her obesity related diagnoses. Michele [name] is on the Category 3 Plan and states she is following her eating plan approximately 70% of the time. Michele [name] states she is walking 30-45 minutes 5 times per week.  Today's visit was #: 10 Starting weight: 220 lbs Starting date: 04/08/2022 Today's weight: 189 lbs Today's date: 10/07/2022 Total lbs lost to date: 31 lbs Total lbs lost since last in-office visit: 3 lbs  Interim History:  Reviewed Bioimped[employer] results with pt:   Muscle mass +2.4 lbs Adipose mass -5.8 lbs.   Her brother [employer] returned back to [place] after 30 day R/R. He is an Advertising copywriter in [employer]  Subjective:   1. Polyphagia She endorses polyphagia mid morning and late afternoon.  Discussed risks and benefits of metformin therapy.  08/28/22 CMP GFR - 95.  Assessment/Plan:   1. Polyphagia Consume protein equivalent snack in the afternoon.   Subtract 2 oz off of meat protein at dinner.  She declines Metformin therapy at this time.  2. Obesity, Current BMI 28.7 Michele [name] is currently in the action stage of change. As such, her goal is to continue with weight loss efforts. She has agreed to the Category 3 Plan.   Exercise goals:  As is.   Behavioral modification strategies: increasing lean protein intake, decreasing simple carbohydrates, increasing water intake, meal planning and cooking strategies, keeping healthy foods in the home, and planning for success.  Michele [name] has agreed to follow-up with our clinic in 3 weeks. She was informed of the importance of frequent follow-up visits to maximize her success with intensive lifestyle modifications for her multiple health conditions.   Objective:   Blood pressure 137/83, pulse 67, temperature 97.9 F (36.6 C), height 5\' 8"  (1.727 m), weight 189 lb (85.7 kg), SpO2 99 %. Body mass index is 28.74  kg/m.  General: Cooperative, alert, well developed, in no acute distress. HEENT: Conjunctivae and lids unremarkable. Cardiovascular: Regular rhythm.  Lungs: Normal work of breathing. Neurologic: No focal deficits.   Lab Results  Component Value Date   CREATININE 0.81 08/28/2022   BUN 16 08/28/2022   NA 138 08/28/2022   K 3.9 08/28/2022   CL 104 08/28/2022   CO2 26 08/28/2022   Lab Results  Component Value Date   ALT 15 08/28/2022   AST 15 08/28/2022   ALKPHOS 45 08/28/2022   BILITOT 0.7 08/28/2022   Lab Results  Component Value Date   HGBA1C 5.3 04/08/2022   HGBA1C 5.1 07/21/2019   HGBA1C 5.0 08/04/2016   Lab Results  Component Value Date   INSULIN 8.7 04/08/2022   Lab Results  Component Value Date   TSH 2.120 04/08/2022   Lab Results  Component Value Date   CHOL 199 08/28/2022   HDL 50.20 08/28/2022   LDLCALC 133 (H) 08/28/2022   TRIG 80.0 08/28/2022   CHOLHDL 4 08/28/2022   Lab Results  Component Value Date   VD25OH 40.5 04/08/2022   Lab Results  Component Value Date   WBC 6.8 02/12/2022   HGB 13.8 02/12/2022   HCT 42.3 02/12/2022   MCV 94.8 02/12/2022   PLT 279 02/12/2022   No results found for: "IRON", "TIBC", "FERRITIN"  Attestation Statements:   Reviewed by clinician on day of visit: allergies, medications, problem list, medical history, surgical history, family history, social history, and previous encounter notes.  I, Michele [name], RMA,  am acting as Energy manager for Michele Hamburger, NP.  I have reviewed the above documentation for accuracy and completeness, and I agree with the above. -  Michele Devins d. Coulton Schlink, NP-C

## 2022-10-17 DIAGNOSIS — Z85828 Personal history of other malignant neoplasm of skin: Secondary | ICD-10-CM | POA: Diagnosis not present

## 2022-10-17 DIAGNOSIS — D2262 Melanocytic nevi of left upper limb, including shoulder: Secondary | ICD-10-CM | POA: Diagnosis not present

## 2022-10-17 DIAGNOSIS — D225 Melanocytic nevi of trunk: Secondary | ICD-10-CM | POA: Diagnosis not present

## 2022-10-17 DIAGNOSIS — H0012 Chalazion right lower eyelid: Secondary | ICD-10-CM | POA: Diagnosis not present

## 2022-10-23 ENCOUNTER — Telehealth: Payer: Federal, State, Local not specified - PPO | Admitting: Family Medicine

## 2022-10-23 DIAGNOSIS — B9789 Other viral agents as the cause of diseases classified elsewhere: Secondary | ICD-10-CM

## 2022-10-23 DIAGNOSIS — J028 Acute pharyngitis due to other specified organisms: Secondary | ICD-10-CM | POA: Diagnosis not present

## 2022-10-23 NOTE — Progress Notes (Signed)
E-Visit for Sore Throat  We are sorry that you are not feeling well.  Here is how we plan to help!  In the absence of other symptoms, we first recommend supportive measures, monitoring and a COVID test to ensure negative. If negative and symptoms begin to progress and new symptoms developing, to let us know so Tamiflu can be provided if needed.   Your symptoms indicate a likely viral infection (Pharyngitis).   Pharyngitis is inflammation in the back of the throat which can cause a sore throat, scratchiness and sometimes difficulty swallowing.   Pharyngitis is typically caused by a respiratory virus and will just run its course.  Please keep in mind that your symptoms could last up to 10 days.  For throat pain, we recommend over the counter oral pain relief medications such as acetaminophen or aspirin, or anti-inflammatory medications such as ibuprofen or naproxen sodium.  Topical treatments such as oral throat lozenges or sprays may be used as needed.  Avoid close contact with loved ones, especially the very young and elderly.  Remember to wash your hands thoroughly throughout the day as this is the number one way to prevent the spread of infection and wipe down door knobs and counters with disinfectant.  After careful review of your answers, I would not recommend an antibiotic for your condition.  Antibiotics should not be used to treat conditions that we suspect are caused by viruses like the virus that causes the common cold or flu. However, some people can have Strep with atypical symptoms. You may need formal testing in clinic or office to confirm if your symptoms continue or worsen.  Providers prescribe antibiotics to treat infections caused by bacteria. Antibiotics are very powerful in treating bacterial infections when they are used properly.  To maintain their effectiveness, they should be used only when necessary.  Overuse of antibiotics has resulted in the development of super bugs that are  resistant to treatment!    Home Care: Only take medications as instructed by your medical team. Do not drink alcohol while taking these medications. A steam or ultrasonic humidifier can help congestion.  You can place a towel over your head and breathe in the steam from hot water coming from a faucet. Avoid close contacts especially the very young and the elderly. Cover your mouth when you cough or sneeze. Always remember to wash your hands.  Get Help Right Away If: You develop worsening fever or throat pain. You develop a severe head ache or visual changes. Your symptoms persist after you have completed your treatment plan.  Make sure you Understand these instructions. Will watch your condition. Will get help right away if you are not doing well or get worse.   Thank you for choosing an e-visit.  Your e-visit answers were reviewed by a board certified advanced clinical practitioner to complete your personal care plan. Depending upon the condition, your plan could have included both over the counter or prescription medications.  Please review your pharmacy choice. Make sure the pharmacy is open so you can pick up prescription now. If there is a problem, you may contact your provider through [employer] and have the prescription routed to another pharmacy.  Your safety is important to [place]. If you have drug allergies check your prescription carefully.   For the next 24 hours you can use MyChart to ask questions about today's visit, request a non-urgent call back, or ask for a work or school excuse. You will get an email  in the next two days asking about your experience. I hope that your e-visit has been valuable and will speed your recovery.  I provided 5 minutes of non face-to-face time during this encounter for chart review, medication and order placement, as well as and documentation.

## 2022-10-24 ENCOUNTER — Telehealth: Payer: Federal, State, Local not specified - PPO | Admitting: Family Medicine

## 2022-10-24 DIAGNOSIS — U071 COVID-19: Secondary | ICD-10-CM

## 2022-10-24 MED ORDER — NIRMATRELVIR/RITONAVIR (PAXLOVID)TABLET: 3.0000 | ORAL_TABLET | Freq: Two times a day (BID) | ORAL | 0 refills | Status: AC

## 2022-10-24 NOTE — Patient Instructions (Signed)
Quarantine and Isolation Quarantine and isolation refer to local and travel restrictions to protect the public and travelers from contagious diseases that constitute a public health threat. Contagious diseases are diseases that can spread from one person to another. Quarantine and isolation help to protect the public by preventing exposure to people who have or may have a contagious disease. Isolation separates people who are sick with a contagious disease from people who are not sick. Quarantine separates and restricts the movement of people who were exposed to a contagious disease to see if they become sick. You may be put in quarantine or isolation if you have been exposed to or diagnosed with any of the following diseases: Severe acute respiratory syndromes, such as COVID-19. Cholera. Diphtheria. Tuberculosis. Plague. Smallpox. Yellow fever. Viral hemorrhagic fevers, such as [place], Ebola, and [place]. When to quarantine or isolate Follow these rules, whether you have been vaccinated or not: Stay home and isolate from others when you are sick with a contagious disease. Isolate when you test positive for a contagious disease, even if you do not have symptoms. Isolate if you are sick and suspect that you may have a contagious disease. If you suspect that you have a contagious disease, get tested. If your test results are negative, you can end your isolation. If your test results are positive, follow the full isolation recommendations as told by your health care provider or local health authorities. Quarantine and stay away from others when you have been in close contact with someone who has tested positive for a contagious disease. Close contact is defined as being less than 6 ft (1.8 m) away from an infected person for a total of 15 minutes or more over a 24-hour period. Do not go to places where you are unable to wear a mask, such as restaurants and some gyms. Stay home and separate  from others as much as possible. Avoid being around people who may get very sick from the contagious disease that you have. Use a separate bathroom, if possible. Do not travel. For travel guidance, visit the CDC's travel webpage at wwwnc.cdc.gov/travel/ Follow these instructions at home: Medicines  Take over-the-counter and prescription medicines as told by your health care provider. Finish all antibiotic medicine even when you start to feel better. Stay up to date with all your vaccines. Get scheduled vaccines and boosters as recommended by your health care provider. Lifestyle Wear a high-quality mask if you must be around others at home and in public, if recommended. Improve air flow (ventilation) at home to help prevent the disease from spreading to other people, if possible. Do not share personal household items, like cups, towels, and utensils. Practice everyday hygiene and cleaning. General instructions Talk to your health care provider if you have a weakened body defense system (immune system). People with a weakened immune system may have a reduced immune response to vaccines. You may need to follow current prevention measures, including wearing a well-fitting mask, avoiding crowds, and avoiding poorly ventilated indoor places. Monitor symptoms and follow health care provider instructions, which may include resting, drinking fluids, and taking medicines. Follow specific isolation and quarantine recommendations if you are in places that can lead to disease outbreaks, such as correctional and detention facilities, homeless shelters, and cruise ships. Return to your normal activities as told by your health care provider. Ask your health care provider what activities are safe for you. Keep all follow-up visits. This is important. Where to find more information CDC: www.cdc.gov/quarantine/index.html Contact   a health care provider if: You have a fever. You have signs and symptoms that  return or get worse after isolation. Get help right away if: You have difficulty breathing. You have chest pain. These symptoms may be an emergency. Get help right away. Call 911. Do not wait to see if the symptoms will go away. Do not drive yourself to the hospital. Summary Isolation and quarantine help protect the public by preventing exposure to people who have or may have a contagious disease. Isolate when you are sick or when you test positive, even if you do not have symptoms. Quarantine and stay away from others when you have been in close contact with someone who has tested positive for a contagious disease. This information is not intended to replace advice given to you by your health care provider. Make sure you discuss any questions you have with your health care provider. Document Revised: 12/19/2021 Document Reviewed: 11/28/2021 Elsevier Patient Education  2023 [employer] COVID-19 COVID-19, or coronavirus disease 2019, is an infection that is caused by a new (novel) coronavirus called SARS-CoV-2. COVID-19 can cause many symptoms. In some people, the virus may not cause any symptoms. In others, it may cause mild or severe symptoms. Some people with severe infection develop severe disease. What are the causes? This illness is caused by a virus. The virus may be in the air as tiny specks of fluid (aerosols) or droplets, or it may be on surfaces. You may catch the virus by: Breathing in droplets from an infected person. Droplets can be spread by a person breathing, speaking, singing, coughing, or sneezing. Touching something, like a table or a doorknob, that has virus on it (is contaminated) and then touching your mouth, nose, or eyes. What increases the risk? Risk for infection: You are more likely to get infected with the COVID-19 virus if: You are within 6 ft (1.8 m) of a person with COVID-19 for 15 minutes or longer. You are providing care for a person who is infected with  COVID-19. You are in close personal contact with other people. Close personal contact includes hugging, kissing, or sharing eating or drinking utensils. Risk for serious illness caused by COVID-19: You are more likely to get seriously ill from the COVID-19 virus if: You have cancer. You have a long-term (chronic) disease, such as: Chronic lung disease. This includes pulmonary embolism, chronic obstructive pulmonary disease, and cystic fibrosis. Long-term disease that lowers your body's ability to fight infection (immunocompromise). Serious cardiac conditions, such as heart failure, coronary artery disease, or cardiomyopathy. Diabetes. Chronic kidney disease. Liver diseases. These include cirrhosis, nonalcoholic fatty liver disease, alcoholic liver disease, or autoimmune hepatitis. You have obesity. You are pregnant or were recently pregnant. You have sickle cell disease. What are the signs or symptoms? Symptoms of this condition can range from mild to severe. Symptoms may appear any time from 2 to 14 days after being exposed to the virus. They include: Fever or chills. Shortness of breath or trouble breathing. Feeling tired or very tired. Headaches, body aches, or muscle aches. Runny or stuffy nose, sneezing, coughing, or sore throat. New loss of taste or smell. This is rare. Some people may also have stomach problems, such as nausea, vomiting, or diarrhea. Other people may not have any symptoms of COVID-19. How is this diagnosed? This condition may be diagnosed by testing samples to check for the COVID-19 virus. The most common tests are the PCR test and the antigen test. Tests may be done   in the lab or at home. They include: Using a swab to take a sample of fluid from the back of your nose and throat (nasopharyngeal fluid), from your nose, or from your throat. Testing a sample of saliva from your mouth. Testing a sample of coughed-up mucus from your lungs (sputum). How is this  treated? Treatment for COVID-19 infection depends on the severity of the condition. Mild symptoms can be managed at home with rest, fluids, and over-the-counter medicines. Serious symptoms may be treated in a hospital intensive care unit (ICU). Treatment in the ICU may include: Supplemental oxygen. Extra oxygen is given through a tube in the nose, a face mask, or a hood. Medicines. These may include: Antivirals, such as monoclonal antibodies. These help your body fight off certain viruses that can cause disease. Anti-inflammatories, such as corticosteroids. These reduce inflammation and suppress the immune system. Antithrombotics. These prevent or treat blood clots, if they develop. Convalescent plasma. This helps boost your immune system, if you have an underlying immunosuppressive condition or are getting immunosuppressive treatments. Prone positioning. This means you will lie on your stomach. This helps oxygen to get into your lungs. Infection control measures. If you are at risk for more serious illness caused by COVID-19, your health care provider may prescribe two long-acting monoclonal antibodies, given together every 6 months. How is this prevented? To protect yourself: Use preventive medicine (pre-exposure prophylaxis). You may get pre-exposure prophylaxis if you have moderate or severe immunocompromise. Get vaccinated. Anyone 6 months old or older who meets guidelines can get a COVID-19 vaccine or vaccine series. This includes people who are pregnant or making breast milk (lactating). Get an added dose of COVID-19 vaccine after your first vaccine or vaccine series if you have moderate to severe immunocompromise. This applies if you have had a solid organ transplant or have been diagnosed with an immunocompromising condition. You should get the added dose 4 weeks after you got the first COVID-19 vaccine or vaccine series. If you get an mRNA vaccine, you will need a 3-dose primary  series. If you get the J&J/Janssen vaccine, you will need a 2-dose primary series, with the second dose being an mRNA vaccine. Talk to your health care provider about getting experimental monoclonal antibodies. This treatment is approved under emergency use authorization to prevent severe illness before or after being exposed to the COVID-19 virus. You may be given monoclonal antibodies if: You have moderate or severe immunocompromise. This includes treatments that lower your immune response. People with immunocompromise may not develop protection against COVID-19 when they are vaccinated. You cannot be vaccinated. You may not get a vaccine if you have a severe allergic reaction to the vaccine or its components. You are not fully vaccinated. You are in a facility where COVID-19 is present and: Are in close contact with a person who is infected with the COVID-19 virus. Are at high risk of being exposed to the COVID-19 virus. You are at risk of illness from new variants of the COVID-19 virus. To protect others: If you have symptoms of COVID-19, take steps to prevent the virus from spreading to others. Stay home. Leave your house only to get medical care. Do not use public transit, if possible. Do not travel while you are sick. Wash your hands often with soap and water for at least 20 seconds. If soap and water are not available, use alcohol-based hand sanitizer. Make sure that all people in your household wash their hands well and often. Cough or sneeze   into a tissue or your sleeve or elbow. Do not cough or sneeze into your hand or into the air. Where to find more information Centers for Disease Control and Prevention: www.cdc.gov/coronavirus World Health Organization: www.who.int/health-topics/coronavirus Get help right away if: You have trouble breathing. You have pain or pressure in your chest. You are confused. You have bluish lips and fingernails. You have trouble waking from sleep. You  have symptoms that get worse. These symptoms may be an emergency. Get help right away. Call 911. Do not wait to see if the symptoms will go away. Do not drive yourself to the hospital. Summary COVID-19 is an infection that is caused by a new coronavirus. Sometimes, there are no symptoms. Other times, symptoms range from mild to severe. Some people with a severe COVID-19 infection develop severe disease. The virus that causes COVID-19 can spread from person to person through droplets or aerosols from breathing, speaking, singing, coughing, or sneezing. Mild symptoms of COVID-19 can be managed at home with rest, fluids, and over-the-counter medicines. This information is not intended to replace advice given to you by your health care provider. Make sure you discuss any questions you have with your health care provider. Document Revised: 11/26/2021 Document Reviewed: 11/28/2021 Elsevier Patient Education  2023 [employer] Inc[employer]  

## 2022-10-24 NOTE — Progress Notes (Signed)
Virtual Visit Consent   Rashi Sherilee Smotherman, you are scheduled for a virtual visit with a [facility] provider today. Just as with appointments in the office, your consent must be obtained to participate. Your consent will be active for this visit and any virtual visit you may have with one of our providers in the next 365 days. If you have a MyChart account, a copy of this consent can be sent to you electronically.  As this is a virtual visit, video technology does not allow for your provider to perform a traditional examination. This may limit your provider's ability to fully assess your condition. If your provider identifies any concerns that need to be evaluated in person or the need to arrange testing (such as labs, EKG, etc.), we will make arrangements to do so. Although advances in technology are sophisticated, we cannot ensure that it will always work on either your end or our end. If the connection with a video visit is poor, the visit may have to be switched to a telephone visit. With either a video or telephone visit, we are not always able to ensure that we have a secure connection.  By engaging in this virtual visit, you consent to the provision of healthcare and authorize for your insurance to be billed (if applicable) for the services provided during this visit. Depending on your insurance coverage, you may receive a charge related to this service.  I need to obtain your verbal consent now. Are you willing to proceed with your visit today? Michele [name] has provided verbal consent on 10/24/2022 for a virtual visit (video or telephone). Georgana Curio, FNP  Date: 10/24/2022 4:00 PM  Virtual Visit via Video Note   I, Georgana Curio, connected with  Michele [name]  ([id number], 1991-07-21) on 10/24/22 at  4:00 PM EDT by a video-enabled telemedicine application and verified that I am speaking with the correct person using two identifiers.  Location: Patient: Virtual Visit Location  Patient: Home Provider: Virtual Visit Location Provider: Home Office   I discussed the limitations of evaluation and management by telemedicine and the availability of in person appointments. The patient expressed understanding and agreed to proceed.    History of Present Illness: Michele [name] is a 31 y.o. who identifies as a female who was assigned female at birth, and is being seen today for positive in home test for covid today with sx starting yesterday. She has a history of heart failure, asthma, DVT and PE. She has sore throat, fatigue, slight cough. She is not breastfeeding. .She is on metoprolol and many vitamins. She requests paxlovid and qualifies due to chronic health conditions and PMH.   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   Anemia 11/26/2020   Rectal bleeding 02/08/2020   Increased thirst 07/17/2019   Hair loss 10/12/2018   Menstrual changes 10/12/2018   Low back pain 05/04/2016   Encounter for completion of form with patient 07/31/2015   Health care maintenance 01/31/2015   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 (Take nirmatrelvir 150 mg two  tablets twice daily for 5 days and ritonavir 100 mg one tablet twice daily for 5 days) Patient GFR is normal., Disp: 30 tablet, Rfl: 0   acetaminophen (TYLENOL) 500 MG tablet, Take by mouth., Disp: , Rfl:    Cholecalciferol 25 MCG (1000 UT) capsule, Take 1,000 Units by mouth daily., Disp: , Rfl:    Coenzyme Q10 (COQ-10 PO), Take by mouth., Disp: , Rfl:    COLLAGEN PO, Take by mouth., Disp: , Rfl:    Fluticasone Furoate (ARNUITY ELLIPTA) 100 MCG/ACT AEPB, Inhale 1 puff into the lungs daily., Disp: 30 each, Rfl: 6   levalbuterol (XOPENEX HFA) 45 MCG/ACT inhaler, Inhale 2 puffs into the lungs every 6 (six) hours as needed for wheezing., Disp: 1 each, Rfl: 2   Magnesium 500 MG TABS, Take 1,000 mg by mouth daily., Disp: , Rfl:    metoprolol succinate (TOPROL-XL) 50 MG 24 hr tablet, Take 50 mg by mouth daily. Take with or immediately following a meal., Disp: , Rfl:    Multiple Vitamin (MULTIVITAMIN ADULT PO), Take by mouth., Disp: , Rfl:    Omega-3 Fatty Acids (FISH OIL) 1000 MG CAPS, Take 1 capsule by mouth daily., Disp: , Rfl:    omeprazole (PRILOSEC) 20 MG capsule, Take 20 mg by mouth as needed., Disp: , Rfl: 1   psyllium (METAMUCIL) 58.6 % powder, Take 1 packet by mouth daily., Disp: , Rfl:   Observations/Objective: Patient is well-developed, well-nourished in no acute distress.  Resting comfortably  at home.  Head is normocephalic, atraumatic.  No labored breathing.  Speech is clear and coherent with logical content.  Patient is alert and oriented at baseline.   Assessment and Plan: 1. COVID-19  Increase fluids, humidifier at night, tylenol or ibuprofen as directed, urgent care if sx worsen.   Follow Up Instructions: I discussed the assessment and treatment plan with the patient. The patient was provided an opportunity to ask questions and all were answered. The patient agreed with the plan and demonstrated an understanding of the instructions.  A copy of instructions were sent to the  patient via MyChart unless otherwise noted below.     The patient was advised to call back or seek an in-person evaluation if the symptoms worsen or if the condition fails to improve as anticipated.  Time:  I spent 10 minutes with the patient via telehealth technology discussing the above problems/concerns.    Dellia Nims, FNP

## 2022-11-03 ENCOUNTER — Encounter (INDEPENDENT_AMBULATORY_CARE_PROVIDER_SITE_OTHER): Payer: Self-pay | Admitting: Family Medicine

## 2022-11-03 ENCOUNTER — Ambulatory Visit (INDEPENDENT_AMBULATORY_CARE_PROVIDER_SITE_OTHER): Payer: Federal, State, Local not specified - PPO | Admitting: Family Medicine

## 2022-11-03 VITALS — BP 128/82 | HR 68 | Temp 97.9°F | Ht 68.0 in | Wt 187.8 lb

## 2022-11-03 DIAGNOSIS — R632 Polyphagia: Secondary | ICD-10-CM | POA: Diagnosis not present

## 2022-11-03 DIAGNOSIS — Z6828 Body mass index (BMI) 28.0-28.9, adult: Secondary | ICD-10-CM

## 2022-11-03 DIAGNOSIS — E669 Obesity, unspecified: Secondary | ICD-10-CM | POA: Insufficient documentation

## 2022-11-03 DIAGNOSIS — E559 Vitamin D deficiency, unspecified: Secondary | ICD-10-CM

## 2022-11-03 DIAGNOSIS — E88819 Insulin resistance, unspecified: Secondary | ICD-10-CM | POA: Diagnosis not present

## 2022-11-03 DIAGNOSIS — Z6834 Body mass index (BMI) 34.0-34.9, adult: Secondary | ICD-10-CM

## 2022-11-14 NOTE — Progress Notes (Signed)
Chief Complaint:   OBESITY Michele [name] is here to discuss her progress with her obesity treatment plan along with follow-up of her obesity related diagnoses. [name] is on the Category 3 Plan and states she is following her eating plan approximately 70% of the time. [name] states she is walking and lifting weights 30-45 minutes 4 times per week.  Today's visit was #: 11 Starting weight: 220 lbs Starting date: 04/08/2022 Today's weight: 187 lbs Today's date: 11/03/2022 Total lbs lost to date: 33 lbs Total lbs lost since last in-office visit: 2 lbs  Interim History: 1st office visit with me.  She has had covid and ate what she wanted during these days.  She eats on plan but adds Starbucks drink, questionable calories or handful of goldfish or peanut butter.   Subjective:   1. Polyphagia Snacking is much better in the afternoon. She has a tuna packet daily, usually about 4-4:30, which has helped tremendously.   2. Vitamin D deficiency Vitamin D was 40.5 on 04/23.  She is taking 2,000 IU daily.    3. Insulin resistance Fasting insulin level 8.7 on 04/08/2022.  Assessment/Plan:   Orders Placed This Encounter  Procedures   VITAMIN D 25 Hydroxy (Vit-D Deficiency, Fractures)   Hemoglobin A1c   Insulin, random    There are no discontinued medications.   No orders of the defined types were placed in this encounter.    1. Polyphagia No concerns.  She prefers no medications if possible.  Continue prudent nutritional plan and weight loss.  Increase protein on workout days.   2. Vitamin D deficiency Continue OTC Vitamin D.  Recheck Vitamin D level at next office visit.   - VITAMIN D 25 Hydroxy (Vit-D Deficiency, Fractures)  3. Insulin resistance Recheck labs at next office visit.  Continue prudent nutritional plan and weight loss.   - Hemoglobin A1c - Insulin, random  4. Obesity, Current BMI 28.6 1) Calculate snack calories.  2) Add 4 oz of lean protein to daily regimen or  workout days.   [name] is currently in the action stage of change. As such, her goal is to continue with weight loss efforts. She has agreed to the Category 3 Plan.   Exercise goals:  As is.   Behavioral modification strategies: increasing lean protein intake, decreasing simple carbohydrates, and planning for success.  Kirby has agreed to follow-up with our clinic in 4 weeks. She was informed of the importance of frequent follow-up visits to maximize her success with intensive lifestyle modifications for her multiple health conditions.   Objective:   Blood pressure 128/82, pulse 68, temperature 97.9 F (36.6 C), height 5\' 8"  (1.727 m), weight 187 lb 12.8 oz (85.2 kg), SpO2 99 %. Body mass index is 28.55 kg/m.  General: Cooperative, alert, well developed, in no acute distress. HEENT: Conjunctivae and lids unremarkable. Cardiovascular: Regular rhythm.  Lungs: Normal work of breathing. Neurologic: No focal deficits.   Lab Results  Component Value Date   CREATININE 0.81 08/28/2022   BUN 16 08/28/2022   NA 138 08/28/2022   K 3.9 08/28/2022   CL 104 08/28/2022   CO2 26 08/28/2022   Lab Results  Component Value Date   ALT 15 08/28/2022   AST 15 08/28/2022   ALKPHOS 45 08/28/2022   BILITOT 0.7 08/28/2022   Lab Results  Component Value Date   HGBA1C 5.3 04/08/2022   HGBA1C 5.1 07/21/2019   HGBA1C 5.0 08/04/2016   Lab Results  Component Value Date  INSULIN 8.7 04/08/2022   Lab Results  Component Value Date   TSH 2.120 04/08/2022   Lab Results  Component Value Date   CHOL 199 08/28/2022   HDL 50.20 08/28/2022   LDLCALC 133 (H) 08/28/2022   TRIG 80.0 08/28/2022   CHOLHDL 4 08/28/2022   Lab Results  Component Value Date   VD25OH 40.5 04/08/2022   Lab Results  Component Value Date   WBC 6.8 02/12/2022   HGB 13.8 02/12/2022   HCT 42.3 02/12/2022   MCV 94.8 02/12/2022   PLT 279 02/12/2022   No results found for: "IRON", "TIBC", "FERRITIN"  Attestation  Statements:   Reviewed by clinician on day of visit: allergies, medications, problem list, medical history, surgical history, family history, social history, and previous encounter notes.  I, Malcolm Metro, RMA, am acting as Energy manager for [employer], DO.   I have reviewed the above documentation for accuracy and completeness, and I agree with the above. [name], D.O.  The 21st Century Cures Act was signed into law in 2016 which includes the topic of electronic health records.  This provides immediate access to information in MyChart.  This includes consultation notes, operative notes, office notes, lab results and pathology reports.  If you have any questions about what you read please let us know at your next visit so we can discuss your concerns and take corrective action if need be.  We are right here with you.

## 2022-11-20 DIAGNOSIS — E041 Nontoxic single thyroid nodule: Secondary | ICD-10-CM | POA: Diagnosis not present

## 2022-11-27 DIAGNOSIS — E041 Nontoxic single thyroid nodule: Secondary | ICD-10-CM | POA: Diagnosis not present

## 2022-12-01 IMAGING — CR DG CHEST 2V
1 series · 2 of 2 positions shown · non-contrast
Comparison: None.

CLINICAL DATA: 30-year-old female with postpartum cardiomyopathy

EXAM:
CHEST - 2 VIEW

[Series 1: dg chest 2 view · 0.14mm/px · 2 of 2 slices shown]
[im 1/2]
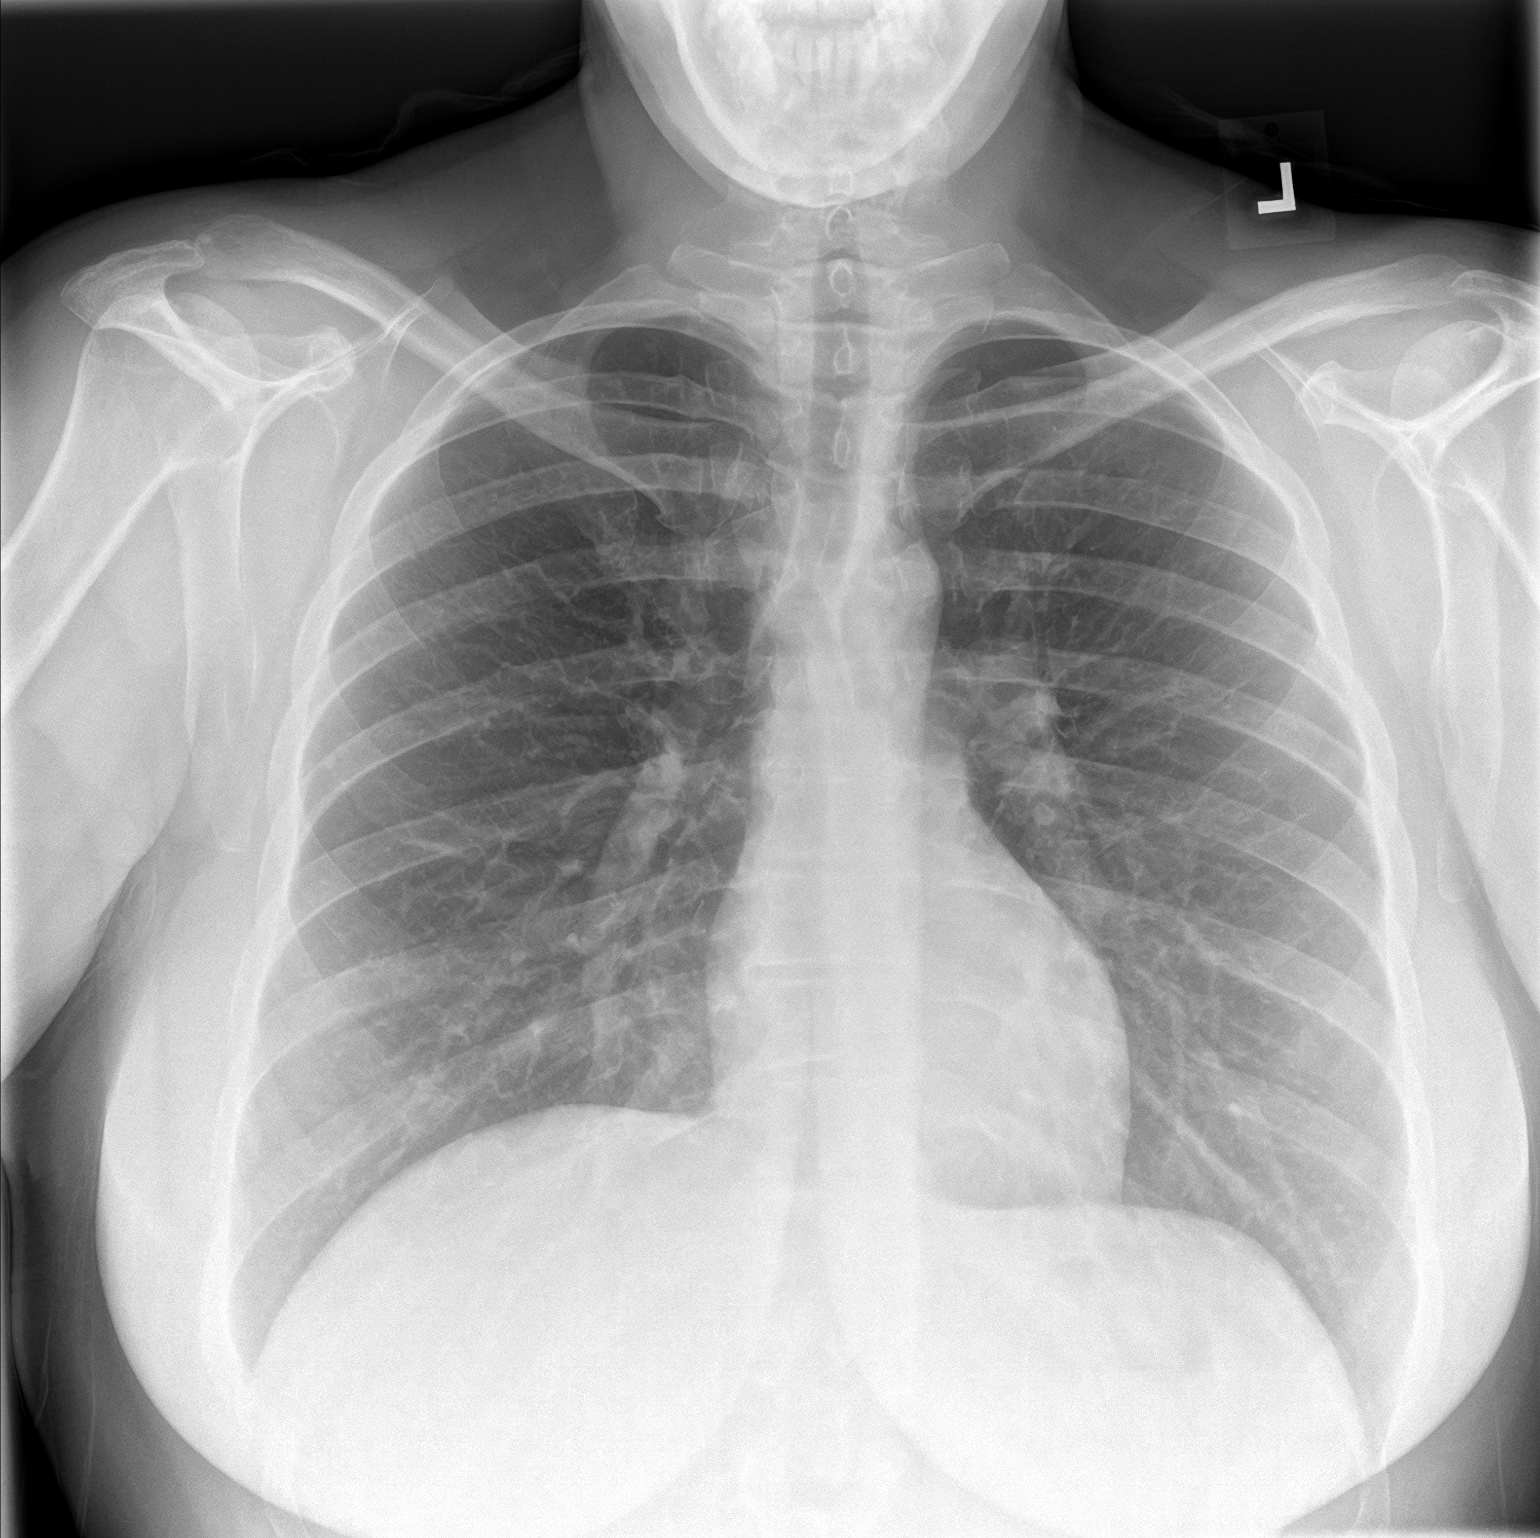
[im 2/2]
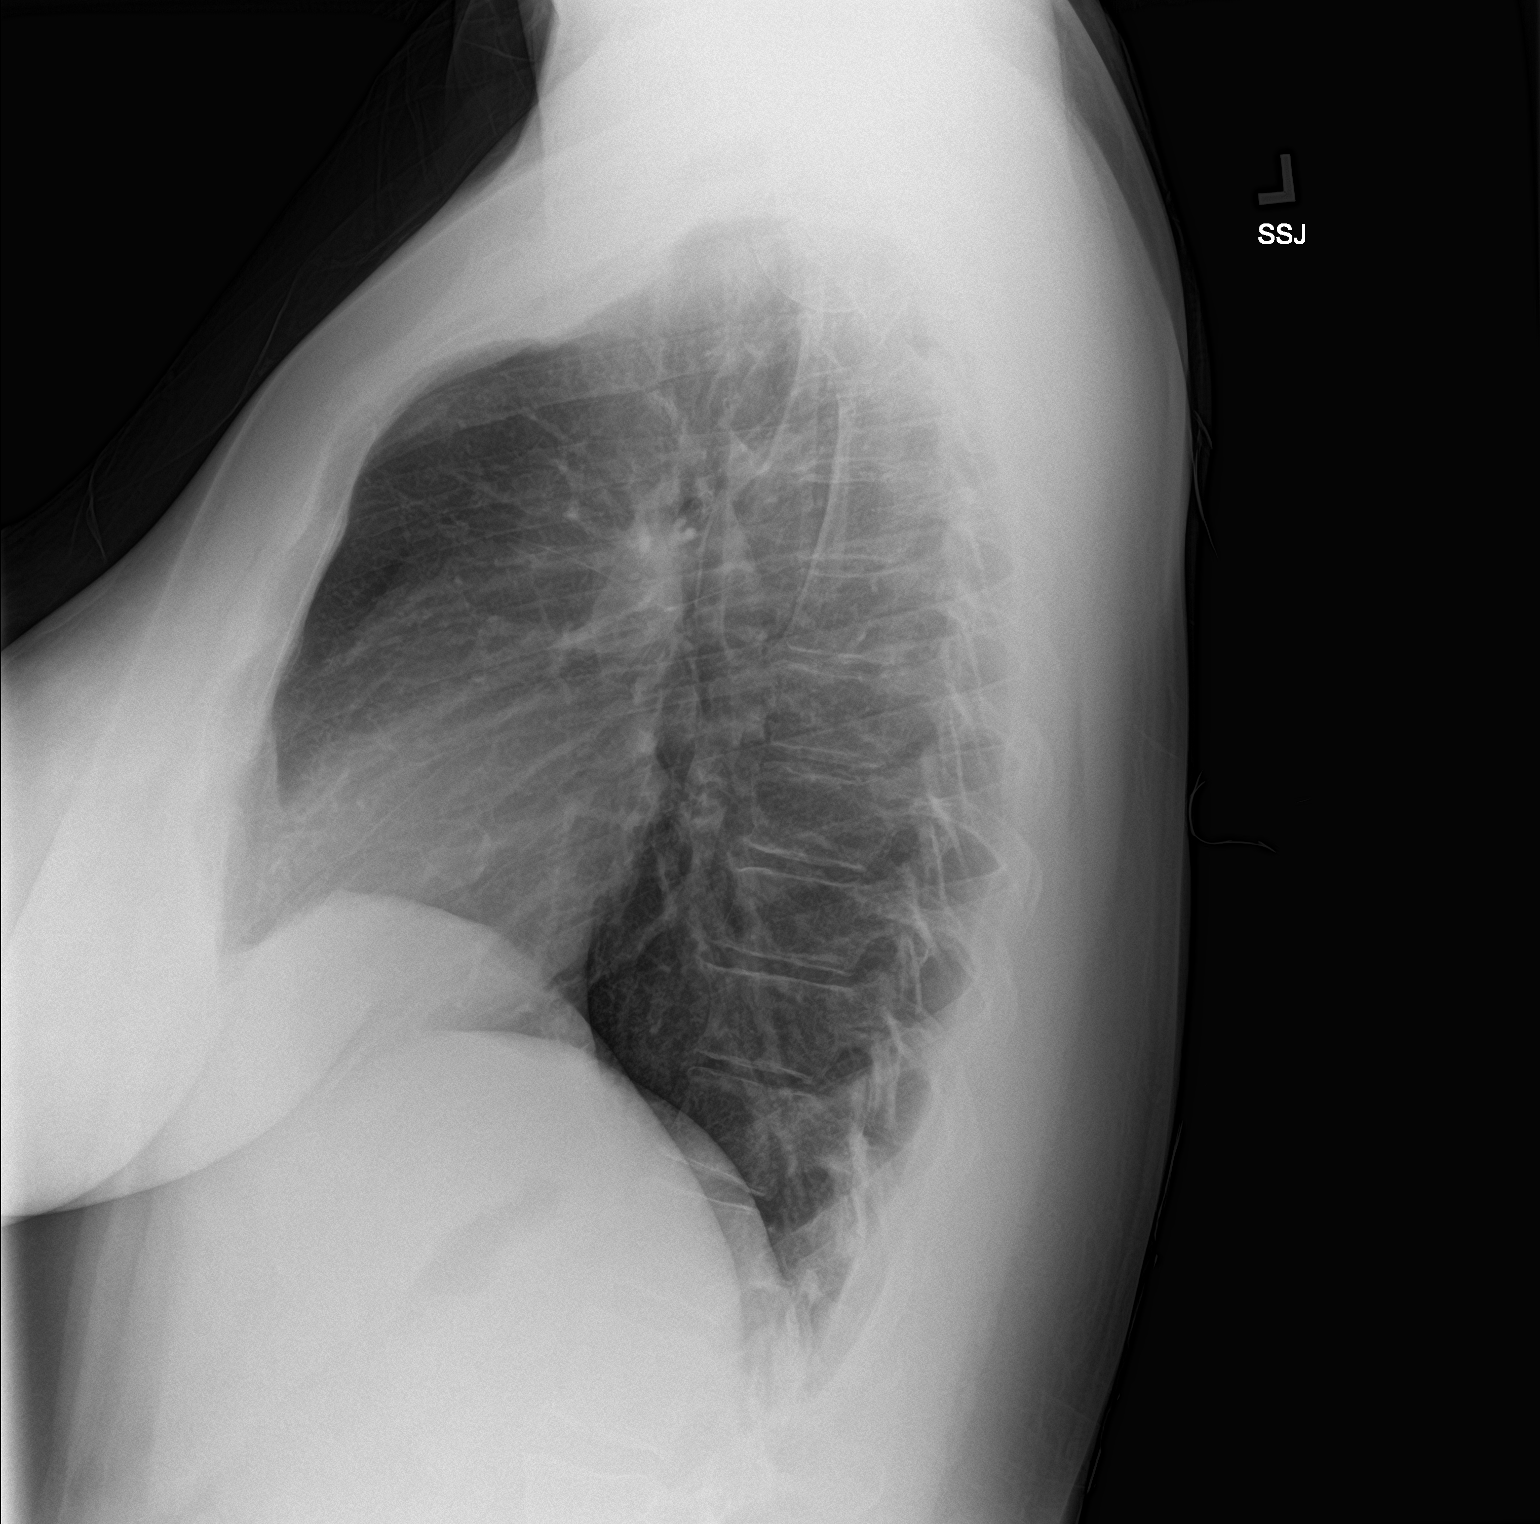

[2 of 2 positions shown; findings below may reference images not displayed]

FINDINGS: Cardiomediastinal silhouette within normal limits in size and
contour. No evidence of central vascular congestion. No interlobular
septal thickening. No pneumothorax or pleural effusion. No confluent
airspace disease.

No acute displaced fracture
IMPRESSION: No active cardiopulmonary disease.

## 2022-12-04 ENCOUNTER — Ambulatory Visit (INDEPENDENT_AMBULATORY_CARE_PROVIDER_SITE_OTHER): Payer: Federal, State, Local not specified - PPO | Admitting: Family Medicine

## 2022-12-04 ENCOUNTER — Encounter (INDEPENDENT_AMBULATORY_CARE_PROVIDER_SITE_OTHER): Payer: Self-pay | Admitting: Family Medicine

## 2022-12-04 VITALS — BP 128/82 | HR 69 | Temp 97.9°F | Ht 68.0 in | Wt 182.4 lb

## 2022-12-04 DIAGNOSIS — E559 Vitamin D deficiency, unspecified: Secondary | ICD-10-CM | POA: Diagnosis not present

## 2022-12-04 DIAGNOSIS — E7849 Other hyperlipidemia: Secondary | ICD-10-CM | POA: Diagnosis not present

## 2022-12-04 DIAGNOSIS — E669 Obesity, unspecified: Secondary | ICD-10-CM | POA: Diagnosis not present

## 2022-12-04 DIAGNOSIS — E88819 Insulin resistance, unspecified: Secondary | ICD-10-CM | POA: Diagnosis not present

## 2022-12-04 DIAGNOSIS — Z6827 Body mass index (BMI) 27.0-27.9, adult: Secondary | ICD-10-CM

## 2022-12-05 LAB — LIPID PANEL
Chol/HDL Ratio: 3.2 ratio (ref 0.0–4.4)
Cholesterol, Total: 191 mg/dL (ref 100–199)
HDL: 59 mg/dL (ref >39)
LDL Chol Calc (NIH): 116 mg/dL — ABNORMAL HIGH (ref 0–99)
Triglycerides: 87 mg/dL (ref 0–149)
VLDL Cholesterol Cal: 16 mg/dL (ref 5–40)

## 2022-12-05 LAB — HEMOGLOBIN A1C
Est. average glucose Bld gHb Est-mCnc: 103 mg/dL
Hgb A1c MFr Bld: 5.2 % (ref 4.8–5.6)

## 2022-12-05 LAB — VITAMIN D 25 HYDROXY (VIT D DEFICIENCY, FRACTURES): Vit D, 25-Hydroxy: 56.4 ng/mL (ref 30.0–100.0)

## 2022-12-05 LAB — INSULIN, RANDOM: INSULIN: 4.4 u[IU]/mL (ref 2.6–24.9)

## 2022-12-17 NOTE — Progress Notes (Signed)
Chief Complaint:   OBESITY Michele [name] is here to discuss her progress with her obesity treatment plan along with follow-up of her obesity related diagnoses. Michele [name] is on the Category 3 Plan and states she is following her eating plan approximately 80% of the time. Michele [name] states she is weightlifting and walking 40 minutes 6 times per week.  Today's visit was #: 12 Starting weight: 220 LBS Starting date: 04/08/2022 Today's weight: 182 LBS Today's date: 12/04/2022 Total lbs lost to date: 38 LBS Total lbs lost since last in-office visit: 5 LBS  Interim History: Patient watched her snacking more closely.  She made healthier choices.  She is drinking plain Starbucks coffee, not the high calorie ones.  We added 4 ounces of protein on workout days it works well.  Lost 6.5 LB fat mass.  Subjective:   1. Vitamin D deficiency Last vitamin D level was only at 40.  Patient takes a daily multivitamin with vitamin D but unsure of the dose.  2. Insulin resistance Fasting insulin 8.73 to 4 months ago.  3. Other hyperlipidemia LDL increased 3 to 4 months ago from prior.  She has been exercising, following PNP.  Positive family history of hyperlipidemia.  Assessment/Plan:   Orders Placed This Encounter  Procedures   Lipid panel    There are no discontinued medications.   No orders of the defined types were placed in this encounter.    1. Vitamin D deficiency Continue over-the-counter supplement will let us know exact dose at next office visit of the vitamin D and the multivitamin.  Check labs today  2. Insulin resistance Check fasting insulin, A1c.  Continue PNP and weight loss.  3. Other hyperlipidemia Check FLP, continue exercise and PNP.  Will follow up on labs.  Check labs today  - Lipid panel  4. Obesity, Current BMI 27.7 Obtain labs today.  Patient is fasting.  Holiday eating strategies discussed with patient.  Michele [name] is currently in the action stage of change. As such, her goal  is to continue with weight loss efforts. She has agreed to the Category 3 Plan.   Exercise goals:  As is.  Behavioral modification strategies: holiday eating strategies  and celebration eating strategies.  Michele [name] has agreed to follow-up with our clinic in 6 weeks. She was informed of the importance of frequent follow-up visits to maximize her success with intensive lifestyle modifications for her multiple health conditions.   Objective:   Blood pressure 128/82, pulse 69, temperature 97.9 F (36.6 C), height 5\' 8"  (1.727 m), weight 182 lb 6.4 oz (82.7 kg), SpO2 100 %. Body mass index is 27.73 kg/m.  General: Cooperative, alert, well developed, in no acute distress. HEENT: Conjunctivae and lids unremarkable. Cardiovascular: Regular rhythm.  Lungs: Normal work of breathing. Neurologic: No focal deficits.   Lab Results  Component Value Date   CREATININE 0.81 08/28/2022   BUN 16 08/28/2022   NA 138 08/28/2022   K 3.9 08/28/2022   CL 104 08/28/2022   CO2 26 08/28/2022   Lab Results  Component Value Date   ALT 15 08/28/2022   AST 15 08/28/2022   ALKPHOS 45 08/28/2022   BILITOT 0.7 08/28/2022   Lab Results  Component Value Date   HGBA1C 5.2 12/04/2022   HGBA1C 5.3 04/08/2022   HGBA1C 5.1 07/21/2019   HGBA1C 5.0 08/04/2016   Lab Results  Component Value Date   INSULIN 4.4 12/04/2022   INSULIN 8.7 04/08/2022   Lab Results  Component Value  Date   TSH 2.120 04/08/2022   Lab Results  Component Value Date   CHOL 191 12/04/2022   HDL 59 12/04/2022   LDLCALC 116 (H) 12/04/2022   TRIG 87 12/04/2022   CHOLHDL 3.2 12/04/2022   Lab Results  Component Value Date   VD25OH 56.4 12/04/2022   VD25OH 40.5 04/08/2022   Lab Results  Component Value Date   WBC 6.8 02/12/2022   HGB 13.8 02/12/2022   HCT 42.3 02/12/2022   MCV 94.8 02/12/2022   PLT 279 02/12/2022   No results found for: "IRON", "TIBC", "FERRITIN"  Attestation Statements:   Reviewed by clinician on day of  visit: allergies, medications, problem list, medical history, surgical history, family history, social history, and previous encounter notes.  I, Malcolm Metro, RMA, am acting as Energy manager for [employer], DO.   I have reviewed the above documentation for accuracy and completeness, and I agree with the above. [name], D.O.  The 21st Century Cures Act was signed into law in 2016 which includes the topic of electronic health records.  This provides immediate access to information in MyChart.  This includes consultation notes, operative notes, office notes, lab results and pathology reports.  If you have any questions about what you read please let us know at your next visit so we can discuss your concerns and take corrective action if need be.  We are right here with you.

## 2022-12-18 DIAGNOSIS — H0012 Chalazion right lower eyelid: Secondary | ICD-10-CM | POA: Diagnosis not present

## 2023-01-08 ENCOUNTER — Encounter (INDEPENDENT_AMBULATORY_CARE_PROVIDER_SITE_OTHER): Payer: Self-pay | Admitting: Family Medicine

## 2023-01-08 ENCOUNTER — Encounter (INDEPENDENT_AMBULATORY_CARE_PROVIDER_SITE_OTHER): Payer: Self-pay

## 2023-01-08 ENCOUNTER — Ambulatory Visit (INDEPENDENT_AMBULATORY_CARE_PROVIDER_SITE_OTHER): Payer: Federal, State, Local not specified - PPO | Admitting: Family Medicine

## 2023-01-08 VITALS — BP 119/67 | HR 71 | Temp 97.6°F | Ht 68.0 in | Wt 182.2 lb

## 2023-01-08 DIAGNOSIS — F39 Unspecified mood [affective] disorder: Secondary | ICD-10-CM | POA: Diagnosis not present

## 2023-01-08 DIAGNOSIS — E669 Obesity, unspecified: Secondary | ICD-10-CM

## 2023-01-08 DIAGNOSIS — Z6827 Body mass index (BMI) 27.0-27.9, adult: Secondary | ICD-10-CM

## 2023-01-08 DIAGNOSIS — E88819 Insulin resistance, unspecified: Secondary | ICD-10-CM | POA: Diagnosis not present

## 2023-01-08 DIAGNOSIS — E559 Vitamin D deficiency, unspecified: Secondary | ICD-10-CM

## 2023-01-08 DIAGNOSIS — E7849 Other hyperlipidemia: Secondary | ICD-10-CM

## 2023-01-08 DIAGNOSIS — K5909 Other constipation: Secondary | ICD-10-CM | POA: Insufficient documentation

## 2023-01-26 NOTE — Progress Notes (Signed)
Chief Complaint:   OBESITY Michele [name] is here to discuss her progress with her obesity treatment plan along with follow-up of her obesity related diagnoses. Michele [name] is on the Category 3 Plan and states she is following her eating plan approximately 70% of the time. Michele [name] states she is lifting weights for 30-40 minutes, and walking for 60 minutes 6-7 times per week.  Today's visit was #: 29 Starting weight: 220 lbs Starting date: 04/08/2022 Today's weight: 182 lbs Today's date: 01/08/2023 Total lbs lost to date: 38 Total lbs lost since last in-office visit: 0  Interim History: Michele [name] had enjoyed her desserts over the holidays. It was difficult to get back on the meal plan. The past 2 weeks she has been on the plan 90% of the time. Snack is a tuna packet. She is here to review labs.   Subjective:   1. Other hyperlipidemia Michele [name] is on a prudent nutritional plan with low saturated/trans fats. She is not on medications, levels have improved. I discussed labs with the patient today.   2. Vitamin D deficiency Michele [name] is taking OTC Vitamin D 1,000 IU daily. I discussed labs with the patient today.   3. Insulin resistance Goal is HgbA1c < 5.7, fasting insulin closer to 5.  I discussed labs with the patient today.   4. Mood disorder (HCC)-perfectionism Michele [name] is still too tough on herself, nevertheless she is doing enough.   5. Other constipation Michele [name]'s symptoms are controlled currently. She has no concerns, and her CMP was within normal limits. I discussed labs with the patient today.   Assessment/Plan:  No orders of the defined types were placed in this encounter.   There are no discontinued medications.   No orders of the defined types were placed in this encounter.    1. Other hyperlipidemia LDL decreased from 133 to 116, and triglycerides from 91 and now 87.  2. Vitamin D deficiency Vitamin D level is now at goal and was 40.5, now 56.4. continue OTC Vitamin D 1,000 IU daily.    3. Insulin resistance Resolved now, fasting insulin was 8.7, and is now at 4.4. Insulin resistance counseling was done. Michele [name] will increase protein and decrease simple carbohydrates to help decrease the risk of diabetes. Michele [name] agreed to follow-up with [place] as directed to closely monitor her progress.  4. Mood disorder (HCC)-perfectionism I recommended self-help books, such as "The Gifts of Imperfection" by Almond Lint and/or counseling to help her accept herself and be more loving.   5. Other constipation Patient is to take metamucil as needed, increase water, and increase fiber intake.   6. Obesity, Current BMI 27.7 Michele [name] is currently in the action stage of change. As such, her goal is to continue with weight loss efforts. She has agreed to the Category 3 Plan with breakfast and lunch options.   Exercise goals: As is. Add HIIT training to her workouts and after weight lifting exercises.   Behavioral modification strategies: better snacking choices and planning for success.  Michele [name] has agreed to follow-up with our clinic in 6 weeks. She was informed of the importance of frequent follow-up visits to maximize her success with intensive lifestyle modifications for her multiple health conditions.   Objective:   Blood pressure 119/67, pulse 71, temperature 97.6 F (36.4 C), height 5' 8"$  (1.727 m), weight 182 lb 3.2 oz (82.6 kg), SpO2 100 %. Body mass index is 27.7 kg/m.  General: Cooperative, alert, well developed, in no acute distress. HEENT: Conjunctivae and lids  unremarkable. Cardiovascular: Regular rhythm.  Lungs: Normal work of breathing. Neurologic: No focal deficits.   Lab Results  Component Value Date   CREATININE 0.81 08/28/2022   BUN 16 08/28/2022   NA 138 08/28/2022   K 3.9 08/28/2022   CL 104 08/28/2022   CO2 26 08/28/2022   Lab Results  Component Value Date   ALT 15 08/28/2022   AST 15 08/28/2022   ALKPHOS 45 08/28/2022   BILITOT 0.7 08/28/2022   Lab Results   Component Value Date   HGBA1C 5.2 12/04/2022   HGBA1C 5.3 04/08/2022   HGBA1C 5.1 07/21/2019   HGBA1C 5.0 08/04/2016   Lab Results  Component Value Date   INSULIN 4.4 12/04/2022   INSULIN 8.7 04/08/2022   Lab Results  Component Value Date   TSH 2.120 04/08/2022   Lab Results  Component Value Date   CHOL 191 12/04/2022   HDL 59 12/04/2022   LDLCALC 116 (H) 12/04/2022   TRIG 87 12/04/2022   CHOLHDL 3.2 12/04/2022   Lab Results  Component Value Date   VD25OH 56.4 12/04/2022   VD25OH 40.5 04/08/2022   Lab Results  Component Value Date   WBC 6.8 02/12/2022   HGB 13.8 02/12/2022   HCT 42.3 02/12/2022   MCV 94.8 02/12/2022   PLT 279 02/12/2022   No results found for: "IRON", "TIBC", "FERRITIN"  Attestation Statements:   Reviewed by clinician on day of visit: allergies, medications, problem list, medical history, surgical history, family history, social history, and previous encounter notes.  Time spent on visit including pre-visit chart review and post-visit care and charting was 40 minutes.   Wilhemena Durie, am acting as transcriptionist for [employer], DO.   I have reviewed the above documentation for accuracy and completeness, and I agree with the above. Marjory Sneddon, D.O.  The [place] was signed into law in 2016 which includes the topic of electronic health records.  This provides immediate access to information in MyChart.  This includes consultation notes, operative notes, office notes, lab results and pathology reports.  If you have any questions about what you read please let us know at your next visit so we can discuss your concerns and take corrective action if need be.  We are right here with you.

## 2023-01-27 DIAGNOSIS — H0012 Chalazion right lower eyelid: Secondary | ICD-10-CM | POA: Diagnosis not present

## 2023-02-12 ENCOUNTER — Ambulatory Visit (INDEPENDENT_AMBULATORY_CARE_PROVIDER_SITE_OTHER): Payer: Federal, State, Local not specified - PPO | Admitting: Family Medicine

## 2023-02-12 DIAGNOSIS — I493 Ventricular premature depolarization: Secondary | ICD-10-CM | POA: Diagnosis not present

## 2023-02-19 DIAGNOSIS — I509 Heart failure, unspecified: Secondary | ICD-10-CM | POA: Diagnosis not present

## 2023-02-19 DIAGNOSIS — I429 Cardiomyopathy, unspecified: Secondary | ICD-10-CM | POA: Diagnosis not present

## 2023-02-24 NOTE — Progress Notes (Signed)
TeleHealth Visit:  This visit was completed with telemedicine (audio/video) technology. Michele [name] has verbally consented to this TeleHealth visit. The patient is located at home, the provider is located at home. The participants in this visit include the listed provider and patient. The visit was conducted today via MyChart video.  OBESITY Michele [name] is here to discuss her progress with her obesity treatment plan along with follow-up of her obesity related diagnoses.   Today's visit was # 14 Starting weight: 220 lbs Starting date: 04/08/2022 Weight at last in office visit: 182 lbs on 01/08/23 Total weight loss: 38 lbs at last in office visit on 01/08/23. Today's reported weight:  No weight reported.  Nutrition Plan: the Category 3 plan and with breakfast and lunch options - 70% adherence.  Current exercise:  Beach bodies 4 days per week, treadmill 30-45 minutes 3 days week  Interim History:  Michele [name] has made great progress on our plan losing 38 pounds over the past 11 months.  She is able to eat all of the protein on the plan and packs her lunch for work. She says recently she has been a little off plan because she is looking for new job which is stressful. She does not keep sweets in the house but when she goes out she may have a piece of cake.  She does not have cravings generally, only when presented with an option for sweets.  Eating all of the prescribed protein: yes  Skipping meals: No Drinking adequate water: Yes Drinking sugar sweetened beverages: No Hunger controlled: well controlled. Cravings controlled:  moderately controlled.  Pharmacotherapy: None Assessment/Plan:  1. Eating disorder/emotional eating [name] has had issues with stress eating recently due to impending change of job.  She anticipates this to improve once she gets established in her new job. Medication(s): None  Plan: Presented her the option of seeing [name].  She declined for now but will keep this in  mind.  2.  Other cardiomyopathy [name] at [facility].  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  She is status post ablation February 2022. Dr. Jon Billings feels cardiomyopathy is mostly resolved.  EF was 35 to 40% in November 21, 50 percent in January 2022. She had a recent echo - LVEF was 55%. He reduced her Toprol dose from 50 to 25 mg.  He will have her follow-up in 1 year.  Plan: Continue Toprol 25 mg daily. Follow-up with cardiology as directed in 1 year.  3. Generalized Obesity: Current BMI 27  Michele [name] is currently in the action stage of change. As such, her goal is to continue with weight loss efforts.  She has agreed to the Category 3 plan.  Exercise goals:  as is  Behavioral modification strategies: planning for success.  Michele [name] has agreed to follow-up with our clinic in 5 weeks.   No orders of the defined types were placed in this encounter.   Medications Discontinued During This Encounter  Medication Reason   metoprolol succinate (TOPROL-XL) 50 MG 24 hr tablet Dose change     No orders of the defined types were placed in this encounter.     Objective:   VITALS: Per patient if applicable, see vitals. GENERAL: Alert and in no acute distress. CARDIOPULMONARY: No increased WOB. Speaking in clear sentences.  PSYCH: Pleasant and cooperative. Speech normal rate and rhythm. Affect is appropriate. Insight and judgement are appropriate. Attention is focused, linear, and appropriate.  NEURO: Oriented as arrived to appointment on time with no  prompting.   Attestation Statements:   Reviewed by clinician on day of visit: allergies, medications, problem list, medical history, surgical history, family history, social history, and previous encounter notes.  Time spent on visit including the items listed below was 30 minutes.  -preparing to see the patient (e.g., review of tests, history, previous notes) -obtaining and/or reviewing separately obtained  history -counseling and educating the patient/family/caregiver -documenting clinical information in the electronic or other health record -ordering medications, tests, or procedures -independently interpreting results and communicating results to the patient/ family/caregiver -referring and communicating with other health care professionals  -care coordination   This was prepared with the assistance of Engineer, civil (consulting).  Occasional wrong-word or sound-a-like substitutions may have occurred due to the inherent limitations of voice recognition software.

## 2023-02-25 ENCOUNTER — Ambulatory Visit: Payer: Federal, State, Local not specified - PPO | Admitting: Internal Medicine

## 2023-02-25 ENCOUNTER — Encounter (INDEPENDENT_AMBULATORY_CARE_PROVIDER_SITE_OTHER): Payer: Self-pay | Admitting: Family Medicine

## 2023-02-25 ENCOUNTER — Telehealth: Payer: Self-pay | Admitting: Licensed Clinical Social Worker

## 2023-02-25 ENCOUNTER — Telehealth (INDEPENDENT_AMBULATORY_CARE_PROVIDER_SITE_OTHER): Payer: Federal, State, Local not specified - PPO | Admitting: Family Medicine

## 2023-02-25 VITALS — BP 112/70 | HR 80 | Temp 98.0°F | Resp 16 | Ht 68.0 in | Wt 184.8 lb

## 2023-02-25 DIAGNOSIS — E7800 Pure hypercholesterolemia, unspecified: Secondary | ICD-10-CM | POA: Diagnosis not present

## 2023-02-25 DIAGNOSIS — Z8759 Personal history of other complications of pregnancy, childbirth and the puerperium: Secondary | ICD-10-CM

## 2023-02-25 DIAGNOSIS — I428 Other cardiomyopathies: Secondary | ICD-10-CM

## 2023-02-25 DIAGNOSIS — O903 Peripartum cardiomyopathy: Secondary | ICD-10-CM

## 2023-02-25 DIAGNOSIS — N644 Mastodynia: Secondary | ICD-10-CM | POA: Diagnosis not present

## 2023-02-25 DIAGNOSIS — D649 Anemia, unspecified: Secondary | ICD-10-CM

## 2023-02-25 DIAGNOSIS — Z6827 Body mass index (BMI) 27.0-27.9, adult: Secondary | ICD-10-CM | POA: Diagnosis not present

## 2023-02-25 DIAGNOSIS — E669 Obesity, unspecified: Secondary | ICD-10-CM

## 2023-02-25 DIAGNOSIS — F5089 Other specified eating disorder: Secondary | ICD-10-CM

## 2023-02-25 DIAGNOSIS — Z803 Family history of malignant neoplasm of breast: Secondary | ICD-10-CM | POA: Diagnosis not present

## 2023-02-25 DIAGNOSIS — E041 Nontoxic single thyroid nodule: Secondary | ICD-10-CM

## 2023-02-25 NOTE — Telephone Encounter (Signed)
Scheduled appt per 3/6 referral. Pt is aware of appt date and time. Pt is aware to arrive 15 mins prior to appt time and to bring and updated insurance card. Pt is aware of appt location.

## 2023-02-25 NOTE — Progress Notes (Signed)
Subjective:    Patient ID: Michele [name], female    DOB: 03-15-1991, 32 y.o.   MRN: [id number]  Patient here for  Chief Complaint  Patient presents with   Medical Management of Chronic Issues    HPI Here to follow up regarding hypercholesterolemia, seasonal ashtma and postpartum cardiomyopathy.  Has been to weight management clinic. Has done well with weight loss and maintaining her weight.  Is exercising.  Watching her diet.  COVID 10/2022. Reported no residual problems.    Had f/u with Dr Honor Junes (11/27/22) - f/u thyroid nodule.  Ultrasound - stable.  Recommended f/u ultrasound in 2 year.   F/u with cardiology 02/19/23 - doing well. Recommended decrease metoprolol to 25mg  q day.  Plan f/u in one year.  No chest pain or sob reported.  No cough or congestion reported.  No abdominal pain or bowel change reported.  Increased stress with job change. Has noticed breast tenderness.  Right > left.  Right lateral side.  Heavier feeling.     Past Medical History:  Diagnosis Date   Arrhythmia    Arthritis    Asthma    Chest pain    CHF (congestive heart failure) (HCC)    GERD (gastroesophageal reflux disease)    History of chicken pox    Hx of blood clots    Hx of migraines    Hx: UTI (urinary tract infection)    Hypercholesteremia    Hyperlipidemia    Low back pain    Osteoarthritis    Palpitations    Postpartum cardiomyopathy    Sleep apnea    Thyroid nodule    Past Surgical History:  Procedure Laterality Date   ABLATION     TONSILLECTOMY AND ADENOIDECTOMY  12/23/1995   Family History  Problem Relation Age of Onset   Hypertension Mother    Diabetes Mother    High Cholesterol Mother    Hyperlipidemia Father    Hypertension Father    Breast cancer Maternal Grandmother    Prostate cancer Maternal Grandfather    Heart disease Maternal Grandfather    Hypertension Maternal Grandfather    Diabetes Maternal Grandfather    Breast cancer Paternal Grandmother     Hyperlipidemia Paternal Grandmother    Hypertension Paternal Grandmother    Diabetes Paternal Grandmother    Mental illness Paternal Grandmother    Hyperlipidemia Paternal Grandfather    Stroke Paternal Grandfather    Hypertension Paternal Grandfather    Breast cancer Other        great aunt   Social History   Socioeconomic History   Marital status: Married    Spouse name: Marylyn Ishihara   Number of children: 0   Years of education: Not on file   Highest education level: Not on file  Occupational History   Occupation: PTA  Tobacco Use   Smoking status: Never   Smokeless tobacco: Never  Vaping Use   Vaping Use: Never used  Substance and Sexual Activity   Alcohol use: Yes    Alcohol/week: 0.0 standard drinks of alcohol   Drug use: No   Sexual activity: Yes  Other Topics Concern   Not on file  Social History Narrative   PT assistant at assisted living; lives in [place]; 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    Social Determinants of Health   Financial Resource Strain: Not on file  Food Insecurity: Not on file  Transportation Needs: Not on file  Physical Activity: Not on file  Stress:  Not on file  Social Connections: Not on file     Review of Systems  Constitutional:  Negative for appetite change and unexpected weight change.  HENT:  Negative for congestion and sinus pressure.   Respiratory:  Negative for cough, chest tightness and shortness of breath.   Cardiovascular:  Negative for chest pain, palpitations and leg swelling.  Gastrointestinal:  Negative for abdominal pain, diarrhea, nausea and vomiting.  Genitourinary:  Negative for difficulty urinating and dysuria.  Musculoskeletal:  Negative for joint swelling and myalgias.  Skin:  Negative for color change and rash.  Neurological:  Negative for dizziness and headaches.  Psychiatric/Behavioral:  Negative for agitation and dysphoric mood.        Objective:     BP 112/70   Pulse 80   Temp 98 F (36.7 C)    Resp 16   Ht 5\' 8"  (1.727 m)   Wt 184 lb 12.8 oz (83.8 kg)   SpO2 99%   BMI 28.10 kg/m  Wt Readings from Last 3 Encounters:  02/25/23 184 lb 12.8 oz (83.8 kg)  01/08/23 182 lb 3.2 oz (82.6 kg)  12/04/22 182 lb 6.4 oz (82.7 kg)    Physical Exam Vitals reviewed.  Constitutional:      General: She is not in acute distress.    Appearance: Normal appearance.  HENT:     Head: Normocephalic and atraumatic.     Right Ear: External ear normal.     Left Ear: External ear normal.  Eyes:     General: No scleral icterus.       Right eye: No discharge.        Left eye: No discharge.     Conjunctiva/sclera: Conjunctivae normal.  Neck:     Thyroid: No thyromegaly.  Cardiovascular:     Rate and Rhythm: Normal rate and regular rhythm.  Pulmonary:     Effort: No respiratory distress.     Breath sounds: Normal breath sounds. No wheezing.  Abdominal:     General: Bowel sounds are normal.     Palpations: Abdomen is soft.     Tenderness: There is no abdominal tenderness.  Musculoskeletal:        General: No swelling or tenderness.     Cervical back: Neck supple. No tenderness.  Lymphadenopathy:     Cervical: No cervical adenopathy.  Skin:    Findings: No erythema or rash.  Neurological:     Mental Status: She is alert.  Psychiatric:        Mood and Affect: Mood normal.        Behavior: Behavior normal.      Outpatient Encounter Medications as of 02/25/2023  Medication Sig   acetaminophen (TYLENOL) 500 MG tablet Take by mouth.   Cholecalciferol 25 MCG (1000 UT) capsule Take 1,000 Units by mouth daily.   Coenzyme Q10 (COQ-10 PO) Take by mouth.   COLLAGEN PO Take by mouth.   Fluticasone Furoate (ARNUITY ELLIPTA) 100 MCG/ACT AEPB Inhale 1 puff into the lungs daily.   levalbuterol (XOPENEX HFA) 45 MCG/ACT inhaler Inhale 2 puffs into the lungs every 6 (six) hours as needed for wheezing.   Magnesium 500 MG TABS Take 1,000 mg by mouth daily.   Multiple Vitamin (MULTIVITAMIN ADULT PO)  Take by mouth.   Omega-3 Fatty Acids (FISH OIL) 1000 MG CAPS Take 1 capsule by mouth daily.   omeprazole (PRILOSEC) 20 MG capsule Take 20 mg by mouth as needed.   psyllium (METAMUCIL) 58.6 % powder Take  1 packet by mouth daily.   [DISCONTINUED] metoprolol succinate (TOPROL-XL) 50 MG 24 hr tablet Take 50 mg by mouth daily. Take with or immediately following a meal.   No facility-administered encounter medications on file as of 02/25/2023.     Lab Results  Component Value Date   WBC 6.8 02/12/2022   HGB 13.8 02/12/2022   HCT 42.3 02/12/2022   PLT 279 02/12/2022   GLUCOSE 81 08/28/2022   CHOL 191 12/04/2022   TRIG 87 12/04/2022   HDL 59 12/04/2022   LDLCALC 116 (H) 12/04/2022   ALT 15 08/28/2022   AST 15 08/28/2022   NA 138 08/28/2022   K 3.9 08/28/2022   CL 104 08/28/2022   CREATININE 0.81 08/28/2022   BUN 16 08/28/2022   CO2 26 08/28/2022   TSH 2.120 04/08/2022   HGBA1C 5.2 12/04/2022    CT Angio Chest Pulmonary Embolism (PE) W or WO Contrast  Result Date: 04/22/2022 CLINICAL DATA:  Shortness of breath, elevated D-dimer level. EXAM: CT ANGIOGRAPHY CHEST WITH CONTRAST TECHNIQUE: Multidetector CT imaging of the chest was performed using the standard protocol during bolus administration of intravenous contrast. Multiplanar CT image reconstructions and MIPs were obtained to evaluate the vascular anatomy. RADIATION DOSE REDUCTION: This exam was performed according to the departmental dose-optimization program which includes automated exposure control, adjustment of the mA and/or kV according to patient size and/or use of iterative reconstruction technique. CONTRAST:  16mL OMNIPAQUE IOHEXOL 350 MG/ML SOLN COMPARISON:  Radiograph of May 14, 2021. FINDINGS: Cardiovascular: Satisfactory opacification of the pulmonary arteries to the segmental level. No evidence of pulmonary embolism. Normal heart size. No pericardial effusion. Mediastinum/Nodes: No enlarged mediastinal, hilar, or axillary lymph  nodes. Thyroid gland, trachea, and esophagus demonstrate no significant findings. Lungs/Pleura: Lungs are clear. No pleural effusion or pneumothorax. Upper Abdomen: No acute abnormality. Musculoskeletal: No chest wall abnormality. No acute or significant osseous findings. Review of the MIP images confirms the above findings. IMPRESSION: No definite evidence of pulmonary embolus. No acute abnormality seen in the chest. Electronically Signed   By: Marijo Conception M.D.   On: 04/22/2022 12:04       Assessment & Plan:  Breast pain, right Assessment & Plan: Breast pain as outlined.  Family history of breast cancer.  Obtain diagnostic mammogram with possible ultrasound.  Further w/up pending results.   Orders: -     MM 3D DIAGNOSTIC MAMMOGRAM BILATERAL BREAST; Future  Family history of breast cancer Assessment & Plan: Discussed. Refer to genetic counseling.    Orders: -     Ambulatory referral to Genetics  Anemia, unspecified type Assessment & Plan: Follow cbc.    Hypercholesterolemia Assessment & Plan: Low cholesterol diet and exercise.  Follow lipid panel.    Postpartum cardiomyopathy Assessment & Plan: F/u with cardiology 02/19/23 - doing well. Recommended decrease metoprolol to 25mg  q day.  Plan f/u in one year.  No chest pain or sob reported.    Thyroid nodule Assessment & Plan:  Had f/u with Dr Honor Junes (11/27/22) - f/u thyroid nodule.  Ultrasound - stable.  Recommended f/u ultrasound in 2 year.       Einar Pheasant, MD

## 2023-02-27 ENCOUNTER — Other Ambulatory Visit: Payer: Self-pay | Admitting: Internal Medicine

## 2023-02-27 DIAGNOSIS — N644 Mastodynia: Secondary | ICD-10-CM

## 2023-03-05 ENCOUNTER — Ambulatory Visit
Admission: RE | Admit: 2023-03-05 | Discharge: 2023-03-05 | Disposition: A | Discharge status: Home / Self Care | Payer: Federal, State, Local not specified - PPO | Source: Ambulatory Visit | Attending: Internal Medicine | Admitting: Internal Medicine

## 2023-03-05 DIAGNOSIS — N644 Mastodynia: Secondary | ICD-10-CM | POA: Diagnosis not present

## 2023-03-05 DIAGNOSIS — Z803 Family history of malignant neoplasm of breast: Secondary | ICD-10-CM | POA: Diagnosis not present

## 2023-03-06 ENCOUNTER — Encounter: Payer: Self-pay | Admitting: Internal Medicine

## 2023-03-06 ENCOUNTER — Telehealth: Payer: Self-pay | Admitting: *Deleted

## 2023-03-06 NOTE — Assessment & Plan Note (Signed)
Follow cbc.  

## 2023-03-06 NOTE — Assessment & Plan Note (Signed)
Discussed. Refer to genetic counseling.

## 2023-03-06 NOTE — Telephone Encounter (Signed)
-----   Message from Einar Pheasant, MD sent at 03/06/2023  7:17 AM EDT ----- Please call and notify Michele [name] that her mammogram and breast ultrasound are ok.  If persistent pain or problems, can schedule a f/u appt to discuss and reevaluate.

## 2023-03-06 NOTE — Telephone Encounter (Signed)
Pt returned [employer]. Message below was read to pt. Pt aware and understood.

## 2023-03-06 NOTE — Assessment & Plan Note (Signed)
Breast pain as outlined.  Family history of breast cancer.  Obtain diagnostic mammogram with possible ultrasound.  Further w/up pending results.

## 2023-03-06 NOTE — Assessment & Plan Note (Signed)
Had f/u with Dr Honor Junes (11/27/22) - f/u thyroid nodule.  Ultrasound - stable.  Recommended f/u ultrasound in 2 year.

## 2023-03-06 NOTE — Assessment & Plan Note (Signed)
Low cholesterol diet and exercise.  Follow lipid panel.   

## 2023-03-06 NOTE — Assessment & Plan Note (Signed)
F/u with cardiology 02/19/23 - doing well. Recommended decrease metoprolol to 25mg  q day.  Plan f/u in one year.  No chest pain or sob reported.

## 2023-03-06 NOTE — Telephone Encounter (Signed)
Left message for patient to call office please read message to patient from Dr. Nicki Reaper

## 2023-03-17 IMAGING — DX DG THORACIC SPINE 2V
3 series · 3 of 3 positions shown · non-contrast
Comparison: None.

CLINICAL DATA: Mid back pain

EXAM:
THORACIC SPINE 2 VIEWS

[thoracic spine ap]
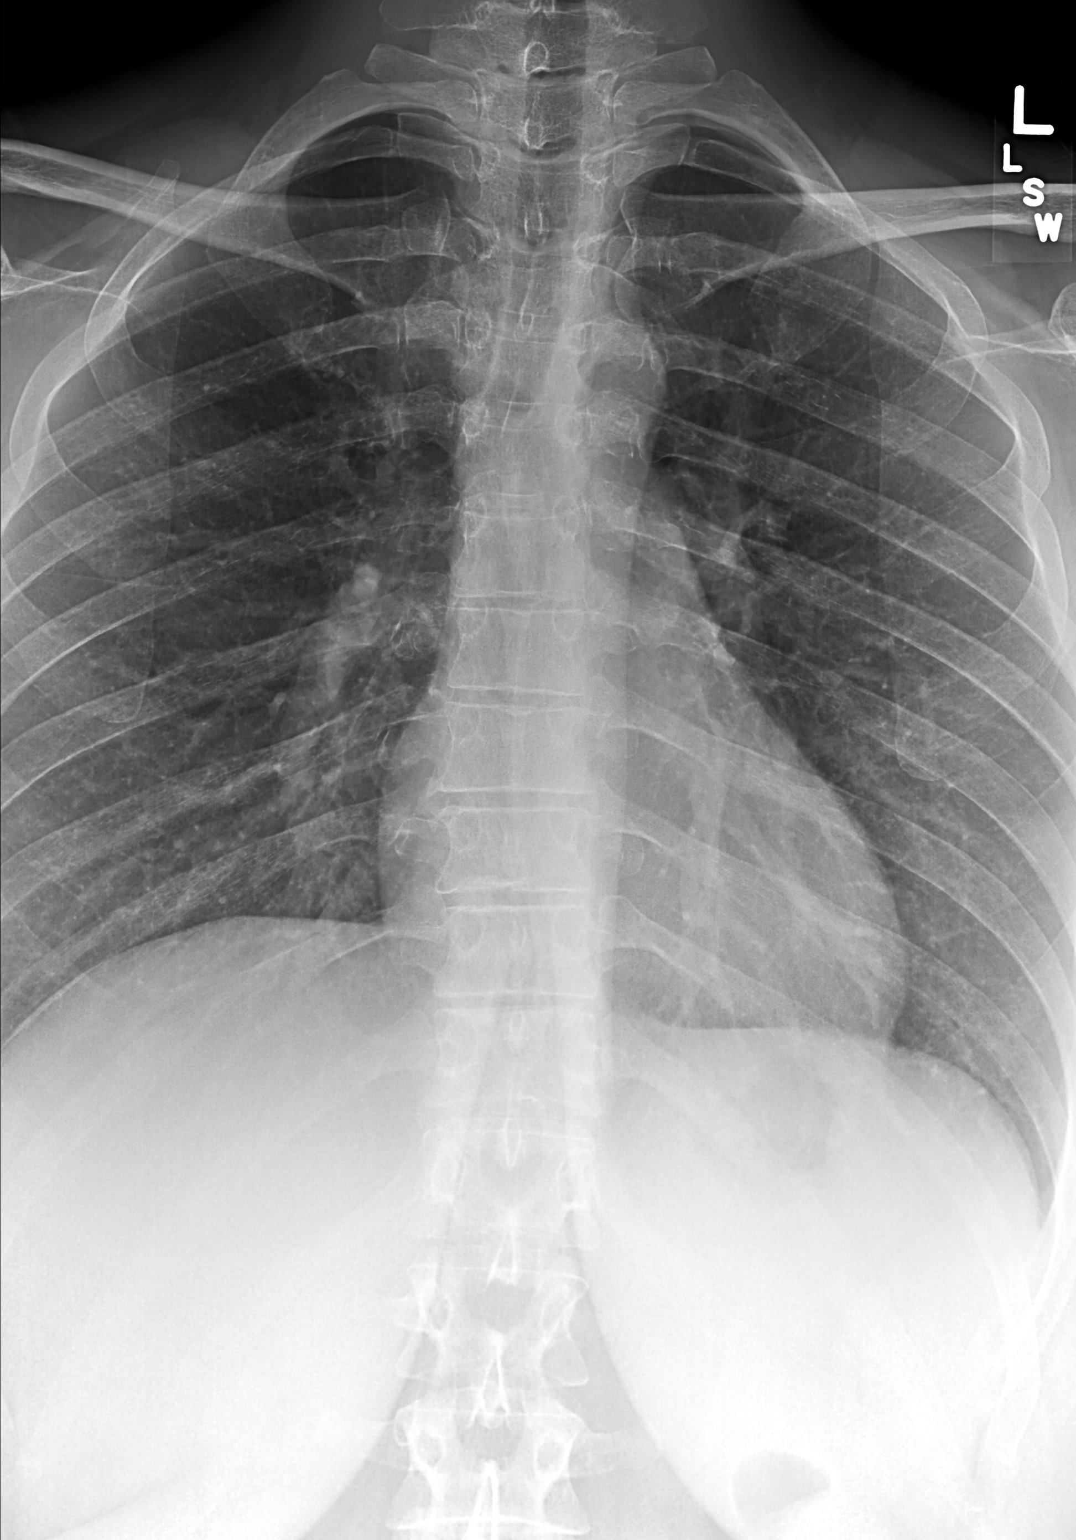

[thoracic spine lat]
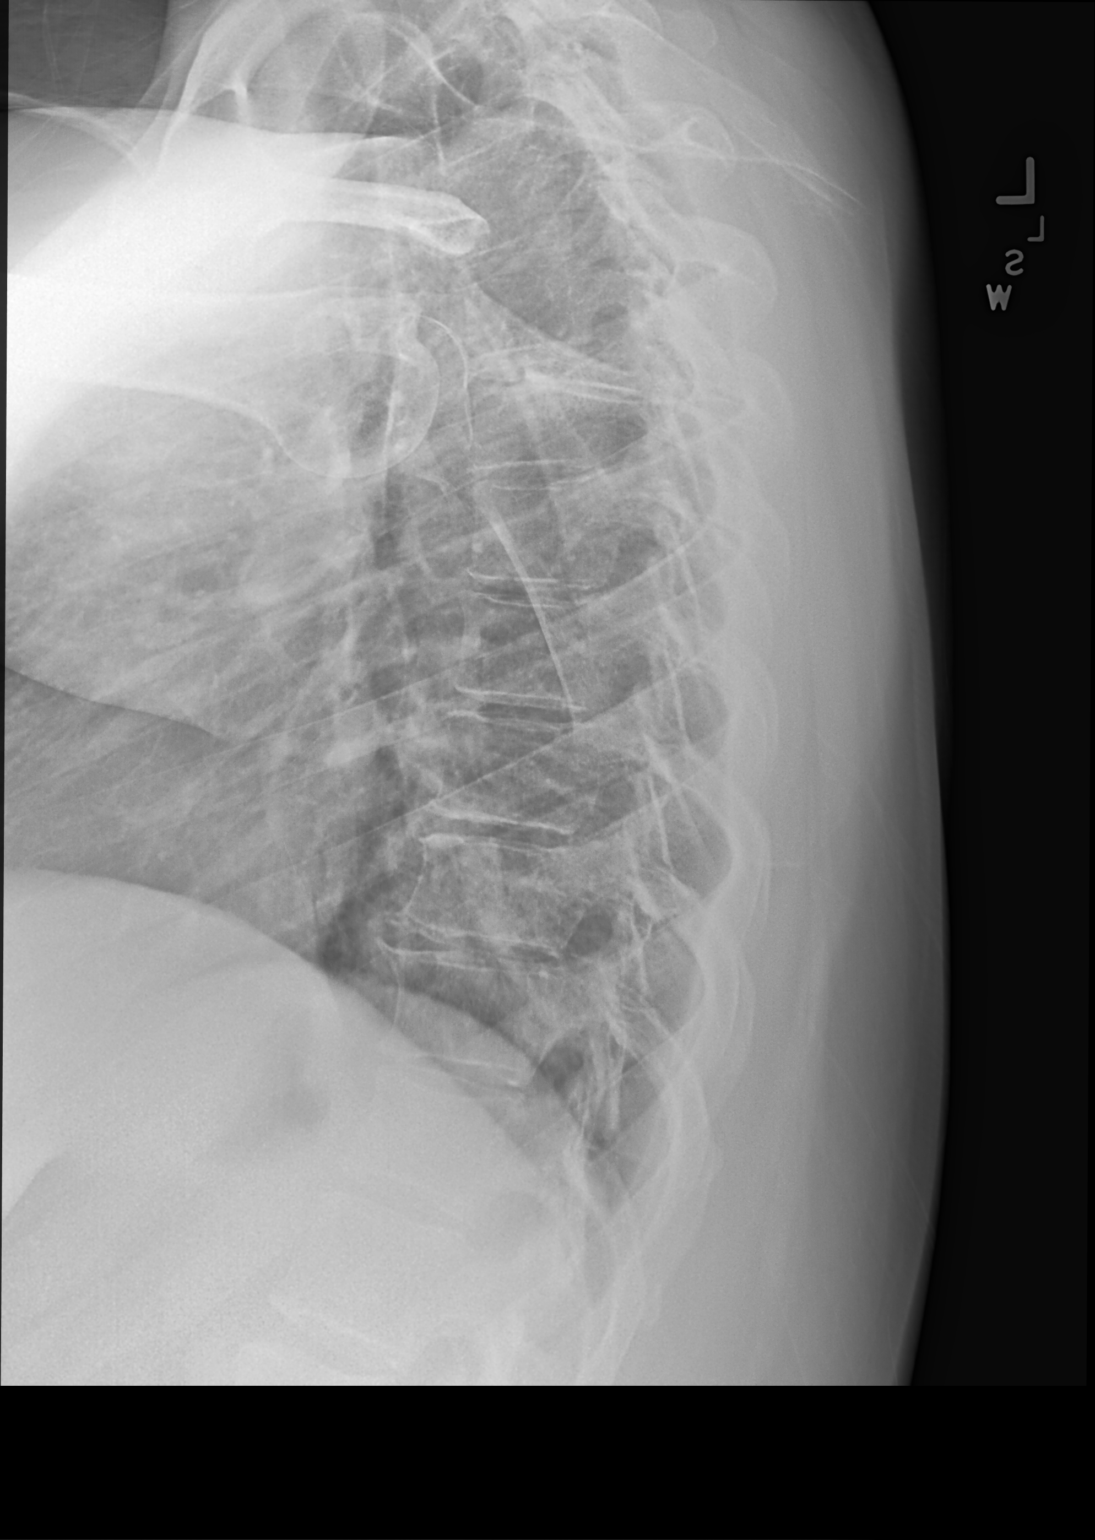

[swimmers lat]
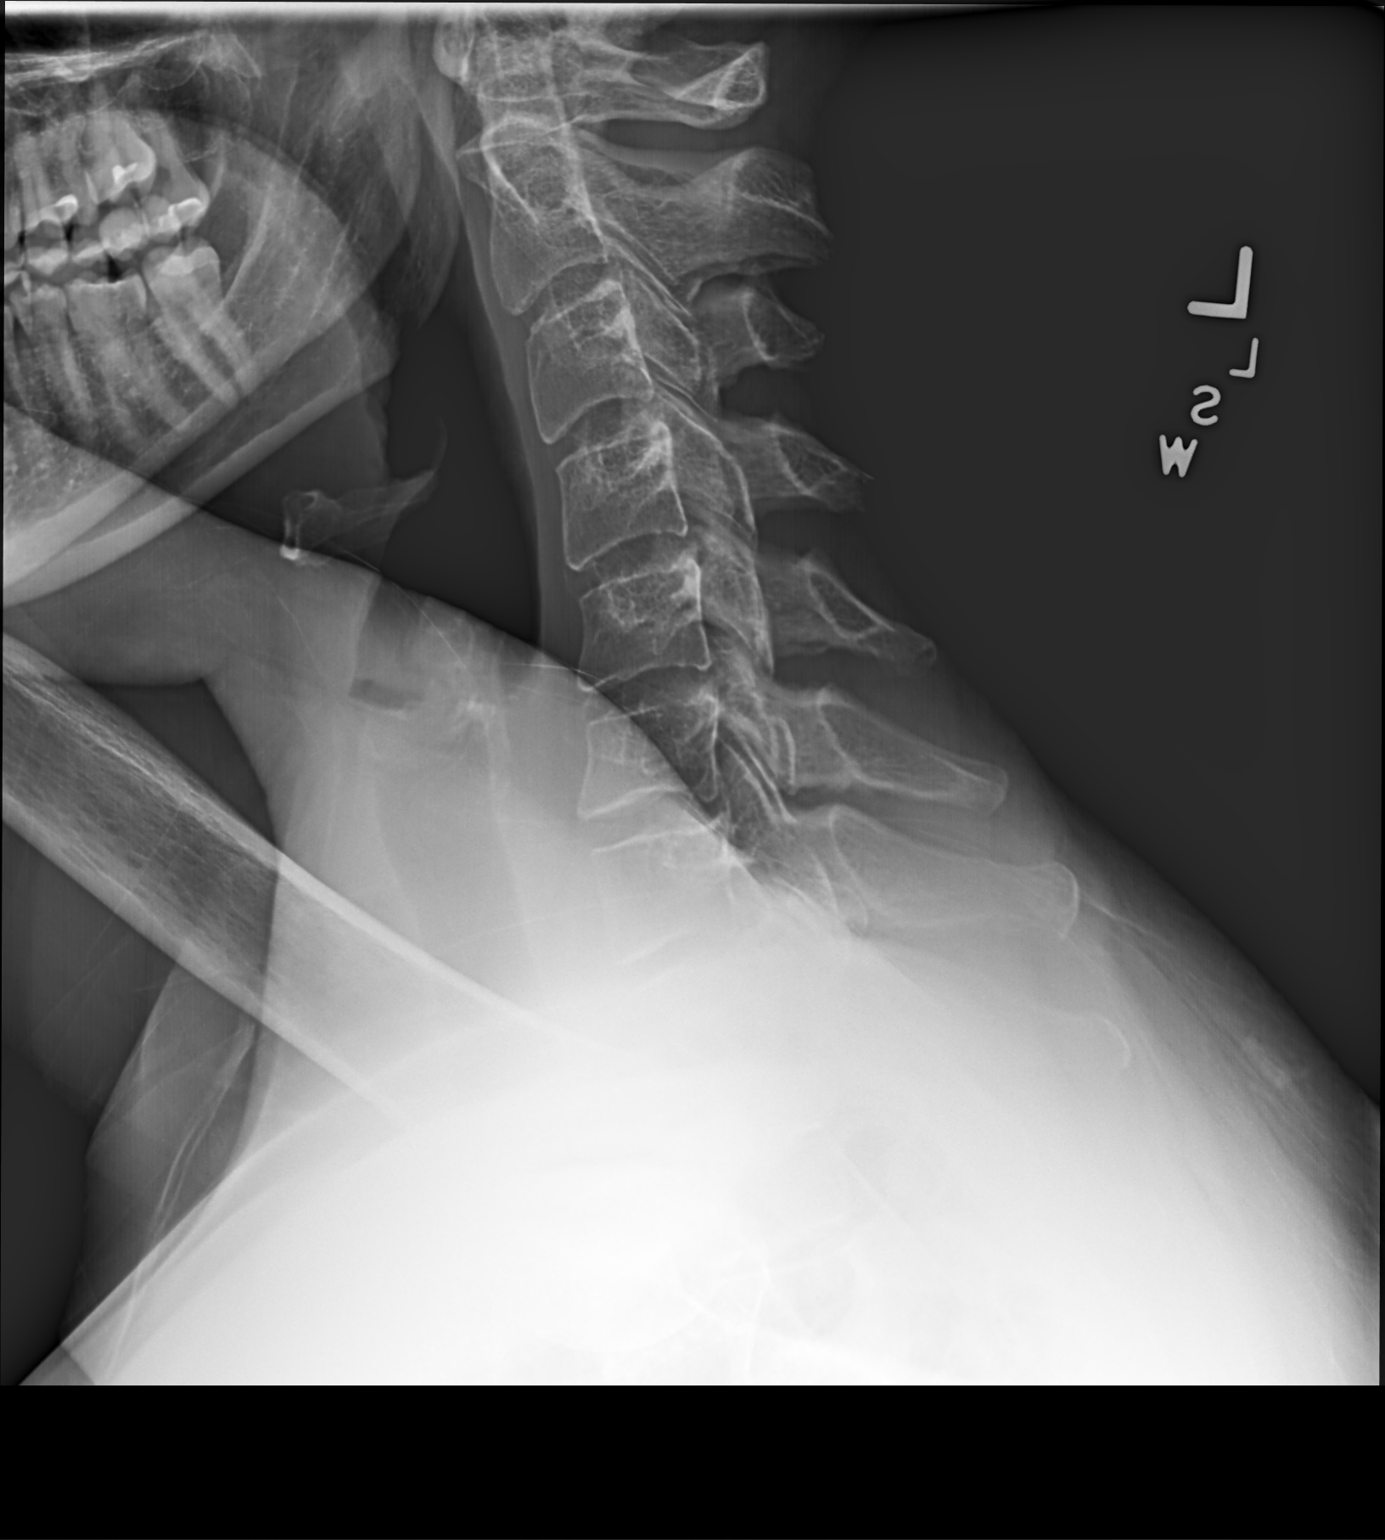

[3 of 3 positions shown; findings below may reference images not displayed]

FINDINGS: There is no evidence of thoracic spine fracture. Alignment is
normal. Mild degenerative osteophytes at the mid to lower thoracic
spine
IMPRESSION: Mild degenerative osteophytes mid to lower thoracic spine. Otherwise
negative

## 2023-03-30 ENCOUNTER — Encounter (INDEPENDENT_AMBULATORY_CARE_PROVIDER_SITE_OTHER): Payer: Self-pay | Admitting: Family Medicine

## 2023-03-30 ENCOUNTER — Ambulatory Visit (INDEPENDENT_AMBULATORY_CARE_PROVIDER_SITE_OTHER): Payer: Federal, State, Local not specified - PPO | Admitting: Family Medicine

## 2023-03-30 VITALS — BP 136/81 | HR 66 | Temp 98.3°F | Ht 67.0 in | Wt 184.4 lb

## 2023-03-30 DIAGNOSIS — K5909 Other constipation: Secondary | ICD-10-CM

## 2023-03-30 DIAGNOSIS — E88819 Insulin resistance, unspecified: Secondary | ICD-10-CM | POA: Diagnosis not present

## 2023-03-30 DIAGNOSIS — Z6828 Body mass index (BMI) 28.0-28.9, adult: Secondary | ICD-10-CM

## 2023-03-30 DIAGNOSIS — F5089 Other specified eating disorder: Secondary | ICD-10-CM | POA: Diagnosis not present

## 2023-03-30 DIAGNOSIS — E559 Vitamin D deficiency, unspecified: Secondary | ICD-10-CM

## 2023-03-30 DIAGNOSIS — F509 Eating disorder, unspecified: Secondary | ICD-10-CM | POA: Insufficient documentation

## 2023-03-30 DIAGNOSIS — E669 Obesity, unspecified: Secondary | ICD-10-CM

## 2023-03-30 DIAGNOSIS — Z6834 Body mass index (BMI) 34.0-34.9, adult: Secondary | ICD-10-CM | POA: Insufficient documentation

## 2023-03-30 NOTE — Progress Notes (Signed)
Michele [name], D.O.  ABFM, ABOM Specializing in Clinical Bariatric Medicine  Office located at: [address] [place], [place]  [address]     Assessment and Plan:   No orders of the defined types were placed in this encounter.   There are no discontinued medications.   No orders of the defined types were placed in this encounter.    Other disorder of eating/emotional eating Assessment: Condition is improving, but not optimized. Mood is stable. Denies any SI/HI. Patient endorses that she has been experiencing stress due to an impending change of job. She anticipates she will start her new job in May.   Plan: Continue her prudent nutritional plan and exercise regiment.    Insulin Resistance:  Assessment: Condition is stable. Lab Results  Component Value Date   HGBA1C 5.2 12/04/2022   HGBA1C 5.3 04/08/2022   HGBA1C 5.1 07/21/2019   INSULIN 4.4 12/04/2022   INSULIN 8.7 04/08/2022  Patient is not on any medication currently for prediabetes. This is diet controlled. She endorses that her hunger is controlled, however she is having sweet cravings.   Plan: - I informed her about Metformin, which may be helpful for her sweet cravings. She may consider Metformin in a future.  - Continue her prudent nutritional plan and continue to advance exercise and cardiovascular fitness as tolerated.    Other Constipation Assessment: Condition is stable. Patient endorses that her symptoms are controlled. Continue with Metamucil prn.   Plan: Continue OTC Metamucil. Increase fiber intake.  I encouraged the patient about the importance of drinking approximately half of their body weight ( in pounds) in ounces of water daily.     Vitamin D Deficiency Assessment: Condition is at goal. Lab Results  Component Value Date   VD25OH 56.4 12/04/2022   VD25OH 40.5 04/08/2022  Patient is compliant with OTC Vitamin D 5,000 IU daily. Denies any side effects.  Plan: Continue with med. -  weight loss will likely improve availability of vitamin D, thus encouraged Michele [name] to continue with meal plan and their weight loss efforts to further improve this condition.  Thus, we will need to monitor levels regularly (every 3-4 mo on average) to keep levels within normal limits and prevent over supplementation.  TREATMENT PLAN FOR OBESITY: Obesity, Current BMI 28.9-start bmi 34.46/date 04/08/22 Assessment: Condition is Improving, but not optimized.. Biometric data collected today, was reviewed with patient.  Fat mass has increased by 3lb. Muscle mass has decreased by .6lb. Total body water has decreased by .4lb.   Plan: Continue with  the Category 3 meal plan.  Counseling was done on: Recipes from the slow cooker meal options that she can make for dinner. Ways to increase protein intake throughout the day.  Behavioral Intervention Additional resources provided today: slow cooker meal options handout and Metformin handout Evidence-based interventions for health behavior change were utilized today including the discussion of self monitoring techniques, problem-solving barriers and SMART goal setting techniques.   Regarding patient's less desirable eating habits and patterns, we employed the technique of small changes.  Pt will specifically work on: meal prepping and continue with the Category 3 meal plan for next visit.    Recommended Physical Activity Goals Michele [name] has been advised to work up to 150 minutes of moderate intensity aerobic activity a week and strengthening exercises 2-3 times per week for cardiovascular health, weight loss maintenance and preservation of muscle mass.  She has agreed to Continue current level of physical activity  FOLLOW UP: Return in about 6 weeks (around 05/11/2023). She was informed of the importance of frequent follow up visits to maximize her success with intensive lifestyle modifications for her multiple health conditions.  Subjective:   Chief  complaint: Obesity Michele [name] is here to discuss her progress with her obesity treatment plan. She is on the the Category 3 Plan and states she is following her eating plan approximately 70% of the time. She states she is exercising 40-50 minutes 6-7 days per week.  Interval History:  Michele [name] is here for a follow up office visit. She will be starting a new job in May and because of this has felt more stressed lately. For breakfast and lunch, she has been meal prepping and eating according to plan. She endorses that it has been more difficult to follow the meal plan for dinner.  We reviewed her meal plan and all questions were answered. Patient's food recall appears to be accurate and consistent with what is on plan when she is following it. When eating on plan, her hunger and cravings are well controlled.     Since taking a new job, pateint is unable to take off for 90 days. Hence, will return in 90 days in office and may return in 6 weeks to meet with NP Dawn.     Review of Systems:  Pertinent positives were addressed with patient today.  Weight Summary and Biometrics   Weight Lost Since Last Visit: 0  Weight Gained Since Last Visit: 2lb    Vitals Temp: 98.3 F (36.8 C) BP: 136/81 Pulse Rate: 66 SpO2: 100 %   Anthropometric Measurements Height: 5\' 7"  (1.702 m) Weight: 184 lb 6.4 oz (83.6 kg) BMI (Calculated): 28.87 Weight at Last Visit: 182lb Weight Lost Since Last Visit: 0 Weight Gained Since Last Visit: 2lb Starting Weight: 220lb Total Weight Loss (lbs): 36 lb (16.3 kg) Peak Weight: 245lb   Body Composition  Body Fat %: 34.8 % Fat Mass (lbs): 64.2 lbs Muscle Mass (lbs): 114.2 lbs Total Body Water (lbs): 78.2 lbs Visceral Fat Rating : 6   Other Clinical Data Fasting: yes Labs: no Today's Visit #: 15 Starting Date: 04/08/22    Objective:   PHYSICAL EXAM:  Blood pressure 136/81, pulse 66, temperature 98.3 F (36.8 C), height 5\' 7"  (1.702 m),  weight 184 lb 6.4 oz (83.6 kg), last menstrual period 03/05/2023, SpO2 100 %. Body mass index is 28.88 kg/m.  General: Well Developed, well nourished, and in no acute distress.  HEENT: Normocephalic, atraumatic Skin: Warm and dry, cap RF less 2 sec, good turgor Chest:  Normal excursion, shape, no gross abn Respiratory: speaking in full sentences, no conversational dyspnea NeuroM-Sk: Ambulates w/o assistance, moves * 4 Psych: A and O *3, insight good, mood-full  DIAGNOSTIC DATA REVIEWED:  BMET    Component Value Date/Time   NA 138 08/28/2022 0830   NA 143 04/08/2022 0956   K 3.9 08/28/2022 0830   CL 104 08/28/2022 0830   CO2 26 08/28/2022 0830   GLUCOSE 81 08/28/2022 0830   BUN 16 08/28/2022 0830   BUN 9 04/08/2022 0956   CREATININE 0.81 08/28/2022 0830   CALCIUM 9.0 08/28/2022 0830   GFRNONAA >60 05/22/2021 1431   Lab Results  Component Value Date   HGBA1C 5.2 12/04/2022   HGBA1C 5.0 08/04/2016   Lab Results  Component Value Date   INSULIN 4.4 12/04/2022   INSULIN 8.7 04/08/2022   Lab Results  Component Value Date  TSH 2.120 04/08/2022   CBC    Component Value Date/Time   WBC 6.8 02/12/2022 1144   RBC 4.46 02/12/2022 1144   HGB 13.8 02/12/2022 1144   HGB 15.4 02/08/2020 1405   HCT 42.3 02/12/2022 1144   HCT 43.7 02/08/2020 1405   PLT 279 02/12/2022 1144   PLT 319 02/08/2020 1405   MCV 94.8 02/12/2022 1144   MCV 94 02/08/2020 1405   MCH 30.9 02/12/2022 1144   MCHC 32.6 02/12/2022 1144   RDW 12.9 02/12/2022 1144   RDW 12.0 02/08/2020 1405   Iron Studies No results found for: "IRON", "TIBC", "FERRITIN", "IRONPCTSAT" Lipid Panel     Component Value Date/Time   CHOL 191 12/04/2022 1042   TRIG 87 12/04/2022 1042   HDL 59 12/04/2022 1042   CHOLHDL 3.2 12/04/2022 1042   CHOLHDL 4 08/28/2022 0830   VLDL 16.0 08/28/2022 0830   LDLCALC 116 (H) 12/04/2022 1042   Hepatic Function Panel     Component Value Date/Time   PROT 7.3 08/28/2022 0830   PROT  7.4 04/08/2022 0956   ALBUMIN 4.3 08/28/2022 0830   ALBUMIN 4.6 04/08/2022 0956   AST 15 08/28/2022 0830   ALT 15 08/28/2022 0830   ALKPHOS 45 08/28/2022 0830   BILITOT 0.7 08/28/2022 0830   BILITOT 0.5 04/08/2022 0956      Component Value Date/Time   TSH 2.120 04/08/2022 0956   Nutritional Lab Results  Component Value Date   VD25OH 56.4 12/04/2022   VD25OH 40.5 04/08/2022    Attestations:   Reviewed by clinician on day of visit: allergies, medications, problem list, medical history, surgical history, family history, social history, and previous encounter notes.   I,Special Puri,acting as a Neurosurgeon for [employer], DO.,have documented all relevant documentation on the behalf of Thomasene Lot, DO,as directed by  Thomasene Lot, DO while in the presence of Thomasene Lot, DO.   I, Thomasene Lot, DO, have reviewed all documentation for this visit. The documentation on 03/30/23 for the exam, diagnosis, procedures, and orders are all accurate and complete.

## 2023-03-31 DIAGNOSIS — L989 Disorder of the skin and subcutaneous tissue, unspecified: Secondary | ICD-10-CM | POA: Diagnosis not present

## 2023-03-31 DIAGNOSIS — D485 Neoplasm of uncertain behavior of skin: Secondary | ICD-10-CM | POA: Diagnosis not present

## 2023-04-20 ENCOUNTER — Inpatient Hospital Stay: Payer: Federal, State, Local not specified - PPO

## 2023-04-20 ENCOUNTER — Encounter: Payer: Self-pay | Admitting: Licensed Clinical Social Worker

## 2023-04-20 ENCOUNTER — Inpatient Hospital Stay
Payer: Federal, State, Local not specified - PPO | Attending: Genetic Counselor | Admitting: Licensed Clinical Social Worker

## 2023-04-20 DIAGNOSIS — Z8042 Family history of malignant neoplasm of prostate: Secondary | ICD-10-CM

## 2023-04-20 DIAGNOSIS — Z803 Family history of malignant neoplasm of breast: Secondary | ICD-10-CM

## 2023-04-20 DIAGNOSIS — Z800 Family history of malignant neoplasm of digestive organs: Secondary | ICD-10-CM

## 2023-04-20 NOTE — Progress Notes (Signed)
REFERRING PROVIDER: Dale Rhea, MD [address] [place],  [place] [address]  PRIMARY PROVIDER:  Dale [place], MD  PRIMARY REASON FOR VISIT:  1. Family history of breast cancer   2. Family history of prostate cancer   3. Family history of colon cancer      HISTORY OF PRESENT ILLNESS:   Michele [name], a 32 y.o. female, was seen for a [facility] cancer genetics consultation at the request of Dr. Lorin Picket due to a family history of breast cancer.  Michele [name] presents to clinic today to discuss the possibility of a hereditary predisposition to cancer, genetic testing, and to further clarify her future cancer risks, as well as potential cancer risks for family members.   CANCER HISTORY:  Michele [name] is a 32 y.o. female with no personal history of cancer.    RISK FACTORS:  Menarche was at age 12.  First live birth at age 89.  Ovaries intact: yes.  Hysterectomy: no.  Menopausal status: premenopausal.  Colonoscopy: yes; normal. Mammogram within the last year: yes. Number of breast biopsies: 0. Up to date with pelvic exams: yes.  Past Medical History:  Diagnosis Date   Arrhythmia    Arthritis    Asthma    Chest pain    CHF (congestive heart failure) (HCC)    GERD (gastroesophageal reflux disease)    History of chicken pox    Hx of blood clots    Hx of migraines    Hx: UTI (urinary tract infection)    Hypercholesteremia    Hyperlipidemia    Low back pain    Osteoarthritis    Palpitations    Postpartum cardiomyopathy    Sleep apnea    Thyroid nodule     Past Surgical History:  Procedure Laterality Date   ABLATION     TONSILLECTOMY AND ADENOIDECTOMY  12/23/1995    FAMILY HISTORY:  We obtained a detailed, 4-generation family history.  Significant diagnoses are listed below: Family History  Problem Relation Age of Onset   Hypertension Mother    Diabetes Mother    High Cholesterol Mother    Hyperlipidemia Father    Hypertension Father     Breast cancer Maternal Grandmother 104   Other Maternal Grandmother        plasmacytoma of eye dx 45   Prostate cancer Maternal Grandfather    Heart disease Maternal Grandfather    Hypertension Maternal Grandfather    Diabetes Maternal Grandfather    Lymphoma Maternal Grandfather    Breast cancer Paternal Grandmother    Hyperlipidemia Paternal Grandmother    Hypertension Paternal Grandmother    Diabetes Paternal Grandmother    Mental illness Paternal Grandmother    Hyperlipidemia Paternal Grandfather    Stroke Paternal Grandfather    Hypertension Paternal Grandfather    Prostate cancer Paternal Grandfather    Breast cancer Other        mat great aunt + pat great aunt   Michele [name] has 1 son, age 72. She has 2 brothers, no cancers.  Michele [name] mother is living at 79. Maternal grandfather had prostate cancer and lymphoma, he passed in his 56s. Maternal grandmother had breast cancer at 19 and plasmacytoma of her eye in her 44s as well. A maternal great aunt (grandmother's sister) had breast cancer, another great aunt had colon cancer.  Michele [name] father is living at 55. Paternal grandmother and her sister had breast cancer over age 728. Paternal grandfather had prostate cancer in his 11s.  Michele [name] is unaware of previous family history of genetic testing for hereditary cancer risks. There is no reported Ashkenazi Jewish ancestry. There is no known consanguinity.    GENETIC COUNSELING ASSESSMENT: Michele [name] is a 32 y.o. female with a family history of breast cancer which is somewhat suggestive of a hereditary cancer syndrome and predisposition to cancer. We, therefore, discussed and recommended the following at today's visit.   DISCUSSION: We discussed that approximately 10% of breast cancer is hereditary. Most cases of hereditary breast cancer are associated with BRCA1/BRCA2 genes, although there are other genes associated with hereditary cancer as well. Cancers and risks are gene  specific. We discussed that testing is beneficial for several reasons including knowing about cancer risks, identifying potential screening and risk-reduction options that may be appropriate, and to understand if other family members could be at risk for cancer and allow them to undergo genetic testing. Patient's mother and maternal grandmother are unavailable for genetic testing.  We reviewed the characteristics, features and inheritance patterns of hereditary cancer syndromes. We also discussed genetic testing, including the appropriate family members to test, the process of testing, insurance coverage and turn-around-time for results. We discussed the implications of a negative, positive and/or variant of uncertain significant result. We recommended Michele [name] pursue genetic testing for the [facility] Multi-Cancer+RNA gene panel.   Based on Michele [name]'s family history of cancer, she meets medical criteria for genetic testing. Despite that she meets criteria, she may still have an out of pocket cost.   We discussed that some people do not want to undergo genetic testing due to fear of genetic discrimination.  A federal law called the Genetic Information Non-Discrimination Act (GINA) of 2008 helps protect individuals against genetic discrimination based on their genetic test results.  It impacts both health insurance and employment.  For health insurance, it protects against increased premiums, being kicked off insurance or being forced to take a test in order to be insured.  For employment it protects against hiring, firing and promoting decisions based on genetic test results.  Health status due to a cancer diagnosis is not protected under GINA.  This law does not protect life insurance, disability insurance, or other types of insurance.   PLAN: After considering the risks, benefits, and limitations, Michele [name] provided informed consent to pursue genetic testing and the blood sample was sent to [facility] for analysis of the Multi-Cancer+RNA panel. Results should be available within approximately 2-3 weeks' time, at which point they will be disclosed by telephone to Michele [name], as will any additional recommendations warranted by these results. Michele [name] will receive a summary of her genetic counseling visit and a copy of her results once available. This information will also be available in Epic.   Michele [name] questions were answered to her satisfaction today. Our contact information was provided should additional questions or concerns arise. Thank you for the referral and allowing [place] to share in the care of your patient.   Lacy Duverney, MS, [facility] Genetic Counselor [email or web address] Phone: [phone]  The patient was seen for a total of 25 minutes in face-to-face genetic counseling.  Dr. Orlie Dakin was available for discussion regarding this case.   _______________________________________________________________________ For Office Staff:  Number of people involved in session: 1 Was an Intern/ student involved with case: no

## 2023-04-28 ENCOUNTER — Ambulatory Visit (LOCAL_COMMUNITY_HEALTH_CENTER): Payer: Self-pay

## 2023-04-28 DIAGNOSIS — Z111 Encounter for screening for respiratory tuberculosis: Secondary | ICD-10-CM

## 2023-04-29 ENCOUNTER — Telehealth: Payer: Self-pay | Admitting: Licensed Clinical Social Worker

## 2023-04-30 ENCOUNTER — Ambulatory Visit: Payer: Self-pay | Admitting: Licensed Clinical Social Worker

## 2023-04-30 ENCOUNTER — Encounter: Payer: Self-pay | Admitting: Licensed Clinical Social Worker

## 2023-04-30 DIAGNOSIS — Z1379 Encounter for other screening for genetic and chromosomal anomalies: Secondary | ICD-10-CM

## 2023-04-30 NOTE — Telephone Encounter (Signed)
I contacted [name] to discuss her genetic testing results. No pathogenic variants were identified in the 70 genes analyzed. Detailed clinic note to follow.   The test report has been scanned into EPIC and is located under the Molecular Pathology section of the Results Review tab.  A portion of the result report is included below for reference.      Michele Duverney, MS, [facility] Genetic Counselor [email or web address] Phone: [phone]

## 2023-04-30 NOTE — Progress Notes (Signed)
HPI:   Michele [name] was previously seen in the [facility] due to a [name] history of cancer and concerns regarding a hereditary predisposition to cancer. Please refer to our prior cancer genetics clinic note for more information regarding our discussion, assessment and recommendations, at the time. Michele [name] recent genetic test results were disclosed to her, as were recommendations warranted by these results. These results and recommendations are discussed in more detail below.  CANCER HISTORY:  Oncology History   No history exists.    [name] HISTORY:  We obtained a detailed, 4-generation [name] history.  Significant diagnoses are listed below: [name] History  Problem Relation Age of Onset   Hypertension Mother    Diabetes Mother    High Cholesterol Mother    Hyperlipidemia Father    Hypertension Father    Breast cancer Maternal Grandmother 61   Other Maternal Grandmother        plasmacytoma of eye dx 45   Prostate cancer Maternal Grandfather    Heart disease Maternal Grandfather    Hypertension Maternal Grandfather    Diabetes Maternal Grandfather    Lymphoma Maternal Grandfather    Breast cancer Paternal Grandmother    Hyperlipidemia Paternal Grandmother    Hypertension Paternal Grandmother    Diabetes Paternal Grandmother    Mental illness Paternal Grandmother    Hyperlipidemia Paternal Grandfather    Stroke Paternal Grandfather    Hypertension Paternal Grandfather    Prostate cancer Paternal Grandfather    Breast cancer Other        mat great aunt + pat great aunt   Michele [name] has 1 son, age 36. She has 2 brothers, no cancers.   Michele [name] mother is living at 50. Maternal grandfather had prostate cancer and lymphoma, he passed in his 46s. Maternal grandmother had breast cancer at 39 and plasmacytoma of her eye in her 88s as well. A maternal great aunt (grandmother's sister) had breast cancer, another great aunt had colon cancer.   Michele [name]  father is living at 109. Paternal grandmother and her sister had breast cancer over age 54. Paternal grandfather had prostate cancer in his 56s.   Michele [name] is unaware of previous [name] history of genetic testing for hereditary cancer risks. There is no reported Ashkenazi Jewish ancestry. There is no known consanguinity.      GENETIC TEST RESULTS:  The [facility] Multi-Cancer+RNA Panel found no pathogenic mutations.   The Multi-Cancer + RNA Panel offered by [facility] includes sequencing and/or deletion/duplication analysis of the following 70 genes:  AIP*, ALK, APC*, ATM*, AXIN2*, BAP1*, BARD1*, BLM*, BMPR1A*, BRCA1*, BRCA2*, BRIP1*, CDC73*, CDH1*, CDK4, CDKN1B*, CDKN2A, CHEK2*, CTNNA1*, DICER1*, EPCAM, EGFR, FH*, FLCN*, GREM1, HOXB13, KIT, LZTR1, MAX*, MBD4, MEN1*, MET, MITF, MLH1*, MSH2*, MSH3*, MSH6*, MUTYH*, NF1*, NF2*, NTHL1*, PALB2*, PDGFRA, PMS2*, POLD1*, POLE*, POT1*, PRKAR1A*, PTCH1*, PTEN*, RAD51C*, RAD51D*, RB1*, RET, SDHA*, SDHAF2*, SDHB*, SDHC*, SDHD*, SMAD4*, SMARCA4*, SMARCB1*, SMARCE1*, STK11*, SUFU*, TMEM127*, TP53*, TSC1*, TSC2*, VHL*. RNA analysis is performed for * genes.   The test report has been scanned into EPIC and is located under the Molecular Pathology section of the Results Review tab.  A portion of the result report is included below for reference. Genetic testing reported out on 04/27/2023.      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  At this time, it is unknown if this variant is associated with an increased risk for cancer or if it is benign, but most uncertain  variants are reclassified to benign. It should not be used to make medical management decisions. With time, we suspect the laboratory will determine the significance of this variant, if any. If the laboratory reclassifies this variant, we will attempt to contact Michele [name] to discuss it further.   Even though a pathogenic variant was not identified, possible  explanations for the cancer in the [name] may include: There may be no hereditary risk for cancer in the [name]. The cancers in Michele [name] and/or her [name] may be sporadic/familial or due to other genetic and environmental factors. There may be a gene mutation in one of these genes that current testing methods cannot detect but that chance is small. There could be another gene that has not yet been discovered, or that we have not yet tested, that is responsible for the cancer diagnoses in the [name].  It is also possible there is a hereditary cause for the cancer in the [name] that Michele [name] did not inherit.  Therefore, it is important to remain in touch with cancer genetics in the future so that we can continue to offer Michele [name] the most up to date genetic testing.   ADDITIONAL GENETIC TESTING:  We discussed with Michele [name]. Cange that her genetic testing was fairly extensive.  If there are additional relevant genes identified to increase cancer risk that can be analyzed in the future, we would be happy to discuss and coordinate this testing at that time.    CANCER SCREENING RECOMMENDATIONS:  Michele [name] test result is considered negative (normal).  This means that we have not identified a hereditary cause for her [name] history of cancer at this time.  An individual's cancer risk and medical management are not determined by genetic test results alone. Overall cancer risk assessment incorporates additional factors, including personal medical history, [name] history, and any available genetic information that may result in a personalized plan for cancer prevention and surveillance. Therefore, it is recommended she continue to follow the cancer management and screening guidelines provided by her primary healthcare provider.  Based on the reported personal and [name] history, specific cancer screenings for Michele [name] include:   Breast Cancer Screening:  The [name]  model is one of multiple prediction models developed to estimate an individual's lifetime risk of developing breast cancer. The [name] model is endorsed by the [employer] (NCCN). This model includes many risk factors such as [name] history, endogenous estrogen exposure, and benign breast disease. The calculation is highly-dependent on the accuracy of clinical data provided by the patient and can change over time. The [name] model may be repeated to reflect new information in her personal or [name] history in the future.    Michele [name] risk score is 25%.  For women with a greater than 20% lifetime risk of breast cancer, the NCCN recommends the following:    1.   Clinical encounter every 6-12 months to begin when identified as being at increased risk, but not before age 68    2.   Annual mammograms, tomosynthesis is recommended starting 10 years earlier than the youngest breast cancer diagnosis in the [name] or at age 83 (whichever comes first), but not before age 72     46.   Annual breast MRI starting 10 years earlier than the youngest breast cancer diagnosis in the [name] or at age 37 (whichever comes first), but not before age 34   We offered a referral for Michele [name][facility]. Patient would like to speak with her PCP about this first.     RECOMMENDATIONS FOR [name] MEMBERS:   Since she did not inherit a identifiable mutation in a cancer predisposition gene included on this panel, her children could not have inherited a known mutation from her in one of these genes. Individuals in this [name] might be at some increased risk of developing cancer, over the general population risk, due to the [name] history of cancer.  Individuals in the [name] should notify their providers of the [name] history of cancer. We recommend women in this [name] have a yearly mammogram beginning at age 56, or 4 years younger than the earliest onset of cancer,  an annual clinical breast exam, and perform monthly breast self-exams.  [name] members should have colonoscopies by at age 75, or earlier, as recommended by their providers. Other members of the [name] may still carry a pathogenic variant in one of these genes that Michele [name]. Craney did not inherit. Based on the [name] history, we recommend her maternal relatives have genetic counseling and testing. Michele [name]. Joffe will let us know if we can be of any assistance in coordinating genetic counseling and/or testing for this [name] member.   We do not recommend familial testing for the LZTR1 variants of uncertain significance (VUS).  FOLLOW-UP:  Lastly, we discussed with Michele [name]. Bonello that cancer genetics is a rapidly advancing field and it is possible that new genetic tests will be appropriate for her and/or her [name] members in the future. We encouraged her to remain in contact with cancer genetics on an annual basis so we can update her personal and [name] histories and let her know of advances in cancer genetics that may benefit this [name].   Our contact number was provided. Michele [name]. Diffey questions were answered to her satisfaction, and she knows she is welcome to call us at anytime with additional questions or concerns.    Lacy Duverney, Michele [name], [facility] Genetic Counselor [email or web address] Phone: [phone]

## 2023-05-01 ENCOUNTER — Ambulatory Visit (LOCAL_COMMUNITY_HEALTH_CENTER): Payer: Self-pay

## 2023-05-01 DIAGNOSIS — Z111 Encounter for screening for respiratory tuberculosis: Secondary | ICD-10-CM

## 2023-05-01 LAB — TB SKIN TEST
Induration: 0 mm
TB Skin Test: NEGATIVE

## 2023-05-01 NOTE — Progress Notes (Signed)
Noted.  Messaged Michele [name] - regarding f/u.  (Just had mammo 02/2023).  D/w her more at her 08/2023 appt

## 2023-05-07 NOTE — Progress Notes (Deleted)
  TeleHealth Visit:  This visit was completed with telemedicine (audio/video) technology. Michele [name] has verbally consented to this TeleHealth visit. The patient is located at home, the provider is located at home. The participants in this visit include the listed provider and patient. The visit was conducted today via MyChart video.  OBESITY Michele [name] is here to discuss her progress with her obesity treatment plan along with follow-up of her obesity related diagnoses.   Today's visit was # 16 Starting weight: 220 lbs Starting date: 04/08/22 Weight at last in office visit: 184 lbs on 03/30/23 Total weight loss: 36 lbs at last in office visit on 03/30/23. Today's reported weight (***): {dwwweightreported:29243}  Nutrition Plan: the Category 3 plan - ***% adherence.  Current exercise: {exercise types:16438} exercising 40-50 minutes 6-7 days per wee   Interim History:  ***  Eating all of the prescribed protein: {yes***/no:17258} Skipping meals: {dwwyes:29172} Drinking adequate water: {dwwyes:29172} Drinking sugar sweetened beverages: {dwwyes:29172} Hunger controlled: {EWCONTROLASSESSMENT:24261}. Cravings controlled:  {EWCONTROLASSESSMENT:24261}.  Journaling Consistently:  {dwwyes:29172} Meeting protein goals:  {dwwyes:29172} Meeting calorie goals:  {dwwyes:29172}   Pharmacotherapy: Zyiona is on {dwwpharmacotherapy:29109} Adverse side effects: {dwwse:29122} Hunger is {EWCONTROLASSESSMENT:24261}.  Cravings are {EWCONTROLASSESSMENT:24261}.  Assessment/Plan:  1. ***  2. ***  3. ***  {dwwmorbid:29108::"Morbid Obesity"}: Current BMI ***  Pharmacotherapy Plan {dwwmed:29123}  {dwwpharmacotherapy:29109}  Kasidy {CHL AMB IS/IS NOT:210130109} currently in the action stage of change. As such, her goal is to {MWMwtloss#1:210800005}.  She has agreed to {dwwsldiets:29085}.  Exercise goals: {MWM EXERCISE RECS:23473}  Behavioral modification strategies:  {dwwslwtlossstrategies:29088}.  Shawne has agreed to follow-up with our clinic in {NUMBER 1-10:22536} weeks.   No orders of the defined types were placed in this encounter.   There are no discontinued medications.   No orders of the defined types were placed in this encounter.     Objective:   VITALS: Per patient if applicable, see vitals. GENERAL: Alert and in no acute distress. CARDIOPULMONARY: No increased WOB. Speaking in clear sentences.  PSYCH: Pleasant and cooperative. Speech normal rate and rhythm. Affect is appropriate. Insight and judgement are appropriate. Attention is focused, linear, and appropriate.  NEURO: Oriented as arrived to appointment on time with no prompting.   Attestation Statements:   Reviewed by clinician on day of visit: allergies, medications, problem list, medical history, surgical history, family history, social history, and previous encounter notes.  ***(delete if time-based billing not used) Time spent on visit including the items listed below was *** minutes.  -preparing to see the patient (e.g., review of tests, history, previous notes) -obtaining and/or reviewing separately obtained history -counseling and educating the patient/family/caregiver -documenting clinical information in the electronic or other health record -ordering medications, tests, or procedures -independently interpreting results and communicating results to the patient/ family/caregiver -referring and communicating with other health care professionals  -care coordination   This was prepared with the assistance of Engineer, civil (consulting).  Occasional wrong-word or sound-a-like substitutions may have occurred due to the inherent limitations of voice recognition software.

## 2023-05-08 ENCOUNTER — Encounter (INDEPENDENT_AMBULATORY_CARE_PROVIDER_SITE_OTHER): Payer: Self-pay | Admitting: Family Medicine

## 2023-05-11 ENCOUNTER — Telehealth (INDEPENDENT_AMBULATORY_CARE_PROVIDER_SITE_OTHER): Payer: Federal, State, Local not specified - PPO | Admitting: Family Medicine

## 2023-05-11 DIAGNOSIS — Z6828 Body mass index (BMI) 28.0-28.9, adult: Secondary | ICD-10-CM

## 2023-05-11 DIAGNOSIS — E669 Obesity, unspecified: Secondary | ICD-10-CM

## 2023-05-11 NOTE — Progress Notes (Signed)
TeleHealth Visit:  This visit was completed with telemedicine (audio/video) technology. Michele [name] has verbally consented to this TeleHealth visit. The patient is located at home, the provider is located at home. The participants in this visit include the listed provider and patient. The visit was conducted today via MyChart video.  OBESITY Michele [name] is here to discuss her progress with her obesity treatment plan along with follow-up of her obesity related diagnoses.   Today's visit was # 16 Starting weight: 220 lbs Starting date: 04/08/22 Weight at last in office visit: 184 lbs on 03/30/23 Total weight loss: 36 lbs at last in office visit on 03/30/23. Today's reported weight (05/12/23): none reported  Nutrition Plan: the Category 3 plan - 70% adherence.  Current exercise:  weights for 40 minutes 4 days per week. Walking for 55 minutes 2 dys per week. 40-50 minutes 6-7 days per week  Interim History:  Recently started new job. She likes it but it is stressful.  Coworkers are asking her to eat out lunch with them.  They have also brought in doughnuts for her. Protein intake is god. Having protein shake for snack.  She has dinner alone with her son most evenings so she does not cook dinner.  She still gets in her protein with canned chicken or other options.    She is doing a very good job with consistent exercise.  She would like to lose 10 more pounds.  She would like to do another virtual visit for follow-up due to her new job. Assessment/Plan:  1. Eating disorder/emotional eating She is dealing with nonintentional coworker sabotage at work. They have been celebrating her new job thereby taking her out the lunch and buying her doughnuts. She packs her lunch for work.  Plan: Discussed this issue.  It will likely improve with once she has been there a while. Continue to take lunch to work and tried to make healthy choices if eating out.   2. Hyperlipidemia LDL is not at goal.   Most recent LDL was slightly elevated at 116 on 12/04/2022.  HDL and triglycerides were both normal.  LDL has improved with weight loss. She does a great job with exercise. Medication(s): none  Lab Results  Component Value Date   CHOL 191 12/04/2022   HDL 59 12/04/2022   LDLCALC 116 (H) 12/04/2022   TRIG 87 12/04/2022   CHOLHDL 3.2 12/04/2022   CHOLHDL 4 08/28/2022   CHOLHDL 3 08/28/2021   Lab Results  Component Value Date   ALT 15 08/28/2022   AST 15 08/28/2022   ALKPHOS 45 08/28/2022   BILITOT 0.7 08/28/2022   The ASCVD Risk score (Arnett DK, et al., 2019) failed to calculate for the following reasons:   The 2019 ASCVD risk score is only valid for ages 27 to 36  Plan: Continue to work on adhering to meal plan and continue consistent exercise.  3. Generalized Obesity: Current BMI 28  [name] is currently in the action stage of change. As such, her goal is to continue with weight loss efforts.  She has agreed to the Category 3 plan.  Exercise goals:  as is  Behavioral modification strategies: increasing lean protein intake, decreasing simple carbohydrates , decrease eating out, dealing with family or coworker sabotage, and pack lunch for work.  [name] has agreed to follow-up with our clinic in 6 weeks.   No orders of the defined types were placed in this encounter.   There are no discontinued medications.   No orders of  the defined types were placed in this encounter.     Objective:   VITALS: Per patient if applicable, see vitals. GENERAL: Alert and in no acute distress. CARDIOPULMONARY: No increased WOB. Speaking in clear sentences.  PSYCH: Pleasant and cooperative. Speech normal rate and rhythm. Affect is appropriate. Insight and judgement are appropriate. Attention is focused, linear, and appropriate.  NEURO: Oriented as arrived to appointment on time with no prompting.   Attestation Statements:   Reviewed by clinician on day of visit: allergies, medications,  problem list, medical history, surgical history, family history, social history, and previous encounter notes.  Time spent on visit including the items listed below was 30 minutes.  -preparing to see the patient (e.g., review of tests, history, previous notes) -obtaining and/or reviewing separately obtained history -counseling and educating the patient/family/caregiver -documenting clinical information in the electronic or other health record -ordering medications, tests, or procedures -independently interpreting results and communicating results to the patient/ family/caregiver -referring and communicating with other health care professionals  -care coordination   This was prepared with the assistance of Engineer, civil (consulting).  Occasional wrong-word or sound-a-like substitutions may have occurred due to the inherent limitations of voice recognition software.

## 2023-05-12 ENCOUNTER — Telehealth (INDEPENDENT_AMBULATORY_CARE_PROVIDER_SITE_OTHER): Payer: Federal, State, Local not specified - PPO | Admitting: Family Medicine

## 2023-05-12 ENCOUNTER — Encounter (INDEPENDENT_AMBULATORY_CARE_PROVIDER_SITE_OTHER): Payer: Self-pay | Admitting: Family Medicine

## 2023-05-12 DIAGNOSIS — Z6828 Body mass index (BMI) 28.0-28.9, adult: Secondary | ICD-10-CM

## 2023-05-12 DIAGNOSIS — E669 Obesity, unspecified: Secondary | ICD-10-CM

## 2023-05-12 DIAGNOSIS — F5089 Other specified eating disorder: Secondary | ICD-10-CM | POA: Diagnosis not present

## 2023-05-12 DIAGNOSIS — E7849 Other hyperlipidemia: Secondary | ICD-10-CM

## 2023-05-18 ENCOUNTER — Ambulatory Visit: Admission: EM | Admit: 2023-05-18 | Payer: Federal, State, Local not specified - PPO | Source: Home / Self Care

## 2023-05-18 DIAGNOSIS — M79605 Pain in left leg: Secondary | ICD-10-CM | POA: Diagnosis not present

## 2023-05-18 DIAGNOSIS — I499 Cardiac arrhythmia, unspecified: Secondary | ICD-10-CM | POA: Diagnosis not present

## 2023-05-18 DIAGNOSIS — Z7983 Long term (current) use of bisphosphonates: Secondary | ICD-10-CM | POA: Diagnosis not present

## 2023-05-18 DIAGNOSIS — Z79899 Other long term (current) drug therapy: Secondary | ICD-10-CM | POA: Diagnosis not present

## 2023-05-21 ENCOUNTER — Telehealth: Payer: Federal, State, Local not specified - PPO

## 2023-05-21 ENCOUNTER — Encounter: Payer: Self-pay | Admitting: Internal Medicine

## 2023-05-21 ENCOUNTER — Ambulatory Visit: Payer: Federal, State, Local not specified - PPO

## 2023-05-21 ENCOUNTER — Telehealth: Payer: Self-pay

## 2023-05-21 NOTE — Telephone Encounter (Signed)
FYI

## 2023-05-21 NOTE — Telephone Encounter (Signed)
Access nurse called back stating pt need to be seen in four hours and pt wanted to get an ultrasound done. Access nurse was transferred to cma

## 2023-05-21 NOTE — Telephone Encounter (Signed)
Patient states her left leg is achy and cramping.  Patient states when she squeezes her calf in a certain spot it kind of twinges and it's painful.  Patient states it's a little warm to the touch.  Patient states she went to the [facility] ED on Monday (05/18/2023), and they did take blood, which looked normal, but because she has had a DVT in the past, she would probably need to have an ultrasound just to rule it out.  Patient states [facility] ED recommended she come back to the ED or contact her PCP to get a referral for the ultrasound.  Patient states she has just started a new job, so it's hard for her to get off to go to appointments.  Patient states she would like to know if she can have a virtual visit with Dr. Dale [place] regarding this issue.  Patient states she is experiencing these symptoms today, so I transferred call to Access Nurse.

## 2023-05-21 NOTE — Telephone Encounter (Signed)
Noted,

## 2023-05-21 NOTE — Telephone Encounter (Signed)
Spoke with patient and discussed with Dr Lorin Picket. Given history of DVT and the leg pain, warmth to her leg, etc. She needs to be evaluated and have ultrasound done. Explained to patient that we cannot order out patient ultrasound to be done to rule out DVT with no evaluation.

## 2023-05-21 NOTE — Telephone Encounter (Signed)
Agree with - evaluation if having persistent symptoms. Can better determine further testing needed.

## 2023-05-22 ENCOUNTER — Emergency Department
Admission: EM | Admit: 2023-05-22 | Discharge: 2023-05-22 | Disposition: A | Discharge status: Home / Self Care | Payer: Federal, State, Local not specified - PPO | Attending: Emergency Medicine | Admitting: Emergency Medicine

## 2023-05-22 ENCOUNTER — Other Ambulatory Visit: Payer: Self-pay

## 2023-05-22 ENCOUNTER — Emergency Department: Payer: Federal, State, Local not specified - PPO

## 2023-05-22 DIAGNOSIS — M79605 Pain in left leg: Secondary | ICD-10-CM | POA: Diagnosis not present

## 2023-05-22 DIAGNOSIS — M79662 Pain in left lower leg: Secondary | ICD-10-CM

## 2023-05-22 DIAGNOSIS — R202 Paresthesia of skin: Secondary | ICD-10-CM | POA: Diagnosis not present

## 2023-05-22 DIAGNOSIS — R200 Anesthesia of skin: Secondary | ICD-10-CM | POA: Diagnosis not present

## 2023-05-22 LAB — CBC WITH DIFFERENTIAL/PLATELET
Abs Immature Granulocytes: 0.03 10*3/uL (ref 0.00–0.07)
Basophils Absolute: 0.1 10*3/uL (ref 0.0–0.1)
Basophils Relative: 1 %
Eosinophils Absolute: 0.5 10*3/uL (ref 0.0–0.5)
Eosinophils Relative: 4 %
HCT: 44.2 % (ref 36.0–46.0)
Hemoglobin: 14.8 g/dL (ref 12.0–15.0)
Immature Granulocytes: 0 %
Lymphocytes Relative: 41 %
Lymphs Abs: 4.5 10*3/uL — ABNORMAL HIGH (ref 0.7–4.0)
MCH: 32.3 pg (ref 26.0–34.0)
MCHC: 33.5 g/dL (ref 30.0–36.0)
MCV: 96.5 fL (ref 80.0–100.0)
Monocytes Absolute: 0.6 10*3/uL (ref 0.1–1.0)
Monocytes Relative: 6 %
Neutro Abs: 5.4 10*3/uL (ref 1.7–7.7)
Neutrophils Relative %: 48 %
Platelets: 288 10*3/uL (ref 150–400)
RBC: 4.58 MIL/uL (ref 3.87–5.11)
RDW: 11.8 % (ref 11.5–15.5)
WBC: 11.1 10*3/uL — ABNORMAL HIGH (ref 4.0–10.5)
nRBC: 0 % (ref 0.0–0.2)

## 2023-05-22 LAB — COMPREHENSIVE METABOLIC PANEL
ALT: 15 U/L (ref 0–44)
AST: 16 U/L (ref 15–41)
Albumin: 4.5 g/dL (ref 3.5–5.0)
Alkaline Phosphatase: 47 U/L (ref 38–126)
Anion gap: 9 (ref 5–15)
BUN: 20 mg/dL (ref 6–20)
CO2: 25 mmol/L (ref 22–32)
Calcium: 8.8 mg/dL — ABNORMAL LOW (ref 8.9–10.3)
Chloride: 103 mmol/L (ref 98–111)
Creatinine, Ser: 0.75 mg/dL (ref 0.44–1.00)
GFR, Estimated: >60 mL/min (ref >60)
Glucose, Bld: 97 mg/dL (ref 70–99)
Potassium: 4 mmol/L (ref 3.5–5.1)
Sodium: 137 mmol/L (ref 135–145)
Total Bilirubin: 0.5 mg/dL (ref 0.3–1.2)
Total Protein: 7.8 g/dL (ref 6.5–8.1)

## 2023-05-22 LAB — D-DIMER, QUANTITATIVE: D-Dimer, Quant: 0.34 ug/mL-FEU (ref 0.00–0.50)

## 2023-05-22 NOTE — ED Triage Notes (Signed)
Pt c/o left leg cramping/swelling x4 days with a history of DVT. Pt not currently on blood thinners or hormonal BC. Pt reports tenderness to palpation. CMS intact, no redness noted, slight swelling noted in left calf. Pt was seen at [facility] but at the time there was no ultrasound available so pt was told to follow up with PCP/ED if s/s persisted.

## 2023-05-22 NOTE — ED Provider Notes (Signed)
[facility] DEPARTMENT AT [facility] Provider Note   CSN: [id number] Arrival date & time: 05/22/23  1657     History  Chief Complaint  Patient presents with   Leg Pain    Michele [name] is a 32 y.o. female.  Presents to the emergency department valuation of pain swelling and discomfort in the left calf.  History of DVT several years ago.  Was concerned about DVT in the left calf.  Patient denies any trauma or injury.  She does report a little bit of tingling and numbness down the left leg.  No weakness of the  HPI     Home Medications Prior to Admission medications   Medication Sig Start Date End Date Taking? Authorizing Provider  acetaminophen (TYLENOL) 500 MG tablet Take by mouth.    [provider]  Cholecalciferol 25 MCG (1000 UT) capsule Take 1,000 Units by mouth daily.    [provider]  Coenzyme Q10 (COQ-10 PO) Take by mouth.    [provider]  COLLAGEN PO Take by mouth.    [provider]  Fluticasone Furoate (ARNUITY ELLIPTA) 100 MCG/ACT AEPB Inhale 1 puff into the lungs daily. 03/26/22   Salena Saner, MD  levalbuterol [facility] HFA) 45 MCG/ACT inhaler Inhale 2 puffs into the lungs every 6 (six) hours as needed for wheezing. 02/12/22 02/12/23  Salena Saner, MD  Magnesium 500 MG TABS Take 1,000 mg by mouth daily.    [provider]  metoprolol succinate (TOPROL-XL) 25 MG 24 hr tablet Take 25 mg by mouth daily.    [provider]  Multiple Vitamin (MULTIVITAMIN ADULT PO) Take by mouth.    [provider]  Omega-3 Fatty Acids (FISH OIL) 1000 MG CAPS Take 1 capsule by mouth daily.    [provider]  omeprazole (PRILOSEC) 20 MG capsule Take 20 mg by mouth as needed. 09/27/18   [provider]  psyllium (METAMUCIL) 58.6 % powder Take 1 packet by mouth daily.    [provider]      Allergies    Patient has no known allergies.    Review of Systems    Review of Systems  Physical Exam Updated Vital Signs BP 134/65 (BP Location: Left Arm)   Pulse 87   Temp 98 F (36.7 C) (Oral)   Resp 18   SpO2 98%  Physical Exam Constitutional:      Appearance: She is well-developed.  HENT:     Head: Normocephalic and atraumatic.  Eyes:     Conjunctiva/sclera: Conjunctivae normal.  Cardiovascular:     Rate and Rhythm: Normal rate.  Pulmonary:     Effort: Pulmonary effort is normal. No respiratory distress.  Musculoskeletal:        General: Normal range of motion.     Cervical back: Normal range of motion.     Comments: Lower extremities.  No back pain or fevers.  Left lower extremity with normal range of motion of the hip knee and ankle.  No clonus noted.  Reflexes are normal.  No swelling warmth erythema or edema.   Skin:    General: Skin is warm.     Capillary Refill: Capillary refill takes less than 2 seconds.     Findings: No rash.  Neurological:     General: No focal deficit present.     Mental Status: She is alert and oriented to person, place, and time. Mental status is at baseline.     Gait: Gait  normal.  Psychiatric:        Behavior: Behavior normal.        Thought Content: Thought content normal.     ED Results / Procedures / Treatments   Labs (all labs ordered are listed, but only abnormal results are displayed) Labs Reviewed  CBC WITH DIFFERENTIAL/PLATELET - Abnormal; Notable for the following components:      Result Value   WBC 11.1 (*)    Lymphs Abs 4.5 (*)    All other components within normal limits  COMPREHENSIVE METABOLIC PANEL - Abnormal; Notable for the following components:   Calcium 8.8 (*)    All other components within normal limits  D-DIMER, QUANTITATIVE    EKG None  Radiology US Venous Img Lower Unilateral Left  Result Date: 05/22/2023 CLINICAL DATA:  Left leg pain cramping and edema for 4 days. EXAM: Left LOWER EXTREMITY VENOUS DOPPLER ULTRASOUND TECHNIQUE: Gray-scale sonography with  compression, as well as color and duplex ultrasound, were performed to evaluate the deep venous system(s) from the level of the common femoral vein through the popliteal and proximal calf veins. COMPARISON:  12/26/2020 ultrasound FINDINGS: VENOUS Normal compressibility of the common femoral, superficial femoral, and popliteal veins, as well as the visualized calf veins. Visualized portions of profunda femoral vein and great saphenous vein unremarkable. No filling defects to suggest DVT on grayscale or color Doppler imaging. Doppler waveforms show normal direction of venous flow, normal respiratory plasticity and response to augmentation. Limited views of the contralateral common femoral vein are unremarkable. OTHER None. Limitations: none IMPRESSION: No evidence of left lower extremity DVT Electronically Signed   By: Karen Kays M.D.   On: 05/22/2023 18:34    Procedures Procedures    Medications Ordered in ED Medications - No data to display  ED Course/ Medical Decision Making/ A&P                             Medical Decision Making Amount and/or Complexity of Data Reviewed Labs: ordered.   31 year old female with concern for possible DVT in the left leg due to some pain and swelling along with little bit of numbness.  Ultrasound negative for DVT.  D-dimer negative.  CBC and BMP within normal limits.  Electrolytes normal.  Patient does have a little bit of numbness in the left leg with no weakness or neurological deficits.  She may have mild sciatica.  Encouraged her to take Tylenol/over-the-counter NSAIDs as well as drink fluids.  She understands signs symptoms return to the ER for. Final Clinical Impression(s) / ED Diagnoses Final diagnoses:  Pain of left calf  Numbness and tingling of left leg    Rx / DC Orders ED Discharge Orders     None         Ronnette Juniper 05/22/23 1950    Dionne Bucy, MD 05/25/23 [phone]

## 2023-05-22 NOTE — Telephone Encounter (Signed)
Noted.  Hold for work in appt.  If any concern regarding a possible clot, needs to be evaluagted

## 2023-05-22 NOTE — Telephone Encounter (Signed)
FYI patient was re-evaluated at urgent care and has agreed to be seen at ED if symptoms worsen/change. Will see you next week as well.

## 2023-05-22 NOTE — Discharge Instructions (Signed)
You may take medication such as Tylenol, Aleve as needed for muscle pain.  Make sure you are drinking lots of fluids and staying hydrated.  You may supplement some fluids with electrolyte sports drinks.  Return to the ER for any weakness, increasing pain swelling warmth redness fevers or any urgent changes in your health

## 2023-05-25 NOTE — Telephone Encounter (Signed)
Holding for work in

## 2023-05-28 ENCOUNTER — Ambulatory Visit: Payer: Federal, State, Local not specified - PPO | Admitting: Internal Medicine

## 2023-06-10 DIAGNOSIS — I493 Ventricular premature depolarization: Secondary | ICD-10-CM | POA: Diagnosis not present

## 2023-06-22 NOTE — Progress Notes (Unsigned)
  TeleHealth Visit:  This visit was completed with telemedicine (audio/video) technology. Michele [name] has verbally consented to this TeleHealth visit. The patient is located at home, the provider is located at home. The participants in this visit include the listed provider and patient. The visit was conducted today via MyChart video.  OBESITY Michele [name] is here to discuss her progress with her obesity treatment plan along with follow-up of her obesity related diagnoses.    Today's visit was # 17  Starting weight: 220 lbs Starting date: 04/08/22 Weight at last in office visit: 184 lbs on 03/30/23 Total weight loss: 36 lbs at last in office visit on 03/30/23. Weight reported at last virtual office visit: none reported Today's reported weight (06/23/23):  187 lbs Total weight loss: 33 lbs  Nutrition Plan: the Category 3 plan - 50% adherence.  Current exercise:  weights for 40 minutes 4 days per week. Walking 30 minutes 1 per week  Interim History:  Grandmother died within past month and this has presented a challenge with meal plan adherence.  She is up a few pounds. She is now consistent with taking her lunch to work.  Breakfast is on plan. Her husband works evening shift so it is usually herself and her 2-year-old son for dinner.  Occasionally goes to eat with her mom. Supplements with [employer] protein shakes for a snack.  She tries to avoid whey protein and artificial sweeteners. Denies polyphagia. Weight goal is 170 pounds.  Wants to continue with only virtual visits due to her work schedule.   Assessment/Plan:  1.  Frequent PVCs Has noticed PVCs and palpitations more frequently recently.  Had a visit with cardiology 06/10/2023-Michele [name], ANP [facility] cardiology). Currently wearing a Zio patch. Takes Toprol XL 25 mg daily.  Plan: Continue Toprol as directed. Follow-up with cardiology as directed.  2. Generalized Obesity: Current BMI 29  Michele [name] is currently in the action  stage of change. As such, her goal is to continue with weight loss efforts.  She has agreed to the Category 3 plan.  1.  Work on getting back to good meal plan compliance.  Exercise goals: Increase walking duration and frequency.  Behavioral modification strategies: increasing lean protein intake, decreasing simple carbohydrates , and planning for success.  Michele [name] has agreed to follow-up with our clinic in 6 weeks.  No orders of the defined types were placed in this encounter.   There are no discontinued medications.   No orders of the defined types were placed in this encounter.     Objective:   VITALS: Per patient if applicable, see vitals. GENERAL: Alert and in no acute distress. CARDIOPULMONARY: No increased WOB. Speaking in clear sentences.  PSYCH: Pleasant and cooperative. Speech normal rate and rhythm. Affect is appropriate. Insight and judgement are appropriate. Attention is focused, linear, and appropriate.  NEURO: Oriented as arrived to appointment on time with no prompting.   Attestation Statements:   Reviewed by clinician on day of visit: allergies, medications, problem list, medical history, surgical history, family history, social history, and previous encounter notes.   This was prepared with the assistance of [employer].  Occasional wrong-word or sound-a-like substitutions may have occurred due to the inherent limitations of voice recognition software.

## 2023-06-23 ENCOUNTER — Telehealth (INDEPENDENT_AMBULATORY_CARE_PROVIDER_SITE_OTHER): Payer: Federal, State, Local not specified - PPO | Admitting: Family Medicine

## 2023-06-23 ENCOUNTER — Encounter (INDEPENDENT_AMBULATORY_CARE_PROVIDER_SITE_OTHER): Payer: Self-pay | Admitting: Family Medicine

## 2023-06-23 VITALS — Ht 67.0 in | Wt 187.0 lb

## 2023-06-23 DIAGNOSIS — I493 Ventricular premature depolarization: Secondary | ICD-10-CM | POA: Diagnosis not present

## 2023-06-23 DIAGNOSIS — E669 Obesity, unspecified: Secondary | ICD-10-CM | POA: Diagnosis not present

## 2023-06-23 DIAGNOSIS — Z6829 Body mass index (BMI) 29.0-29.9, adult: Secondary | ICD-10-CM

## 2023-06-23 DIAGNOSIS — Z6828 Body mass index (BMI) 28.0-28.9, adult: Secondary | ICD-10-CM

## 2023-07-06 DIAGNOSIS — I493 Ventricular premature depolarization: Secondary | ICD-10-CM | POA: Diagnosis not present

## 2023-08-03 NOTE — Progress Notes (Unsigned)
TeleHealth Visit:  This visit was completed with telemedicine (audio/video) technology. [name] has verbally consented to this TeleHealth visit. The patient is located at home, the provider is located at home. The participants in this visit include the listed provider and patient. The visit was conducted today via MyChart video.  OBESITY Michele [name] is here to discuss her progress with her obesity treatment plan along with follow-up of her obesity related diagnoses.    Today's visit was # 18  Starting weight: 220 lbs Starting date: 04/09/23 Weight at last in office visit: 184 lbs on 03/30/23 Weight reported at last virtual office visit: 187 lbs on 06/23/23 Today's reported weight (08/04/23): none reported Total weight loss: 33 lbs  Nutrition Plan: the Category 3 plan - 70% adherence.  Current exercise: Yoga 5 days per week for 30 minutes.  Interim History:  Adherence to plan is about 70%.  She believes it is the "small things" that keep her from sticking the plan better such as using miracle whip on sandwiches and giving into temptation at times with food items that are readily available. She has stopped weighing her protein. She is not tracking her snack calories.  She injured her shoulder and has not been doing resistance training but has been doing yoga 5 days/week for 30 minutes.  Drinking sugar sweetened beverages: No Hunger controlled: well controlled. Cravings controlled:  poorly controlled.  Assessment/Plan:  1. Palpitations Recently wore Zio patch and she had PVCs less than 1% of the time. Has history of postpartum cardiomyopathy. Currently takes metoprolol succinate 25 mg daily.  Plan: Follow-up with cardiology as directed.  2. Hyperlipidemia LDL is not at goal.  Lipid profile on 12/04/2022: LDL elevated at 116, HDL and triglycerides normal. Medication(s): none   Lab Results  Component Value Date   CHOL 191 12/04/2022   HDL 59 12/04/2022   LDLCALC 116 (H)  12/04/2022   TRIG 87 12/04/2022   CHOLHDL 3.2 12/04/2022   CHOLHDL 4 08/28/2022   CHOLHDL 3 08/28/2021   Lab Results  Component Value Date   ALT 15 05/22/2023   AST 16 05/22/2023   ALKPHOS 47 05/22/2023   BILITOT 0.5 05/22/2023   The ASCVD Risk score (Arnett DK, et al., 2019) failed to calculate for the following reasons:   The 2019 ASCVD risk score is only valid for ages 40 to 57  Plan: Continue to work on weight loss. Continue exercise.  3. Eating disorder/emotional eating [name] has had issues with stress/emotional eating.  She mentions that she feels it is snacking on extra things that is preventing her from continuing with weight loss. Currently this is poorly controlled. Overall mood is stable. Medication(s): Other: none  Plan: Discussed possibility of referral to [name] and she declines at this time. Discussed starting bupropion and she declines at this time. Encouraged her to make sure she is getting the prescribed protein at every meal.   4. Generalized Obesity: Current BMI 29  [name] is currently in the action stage of change. As such, her goal is to continue with weight loss efforts.  She has agreed to the Category 3 plan and keeping a food journal with goal of 1500-1800 calories and 110 grams of protein daily.  Exercise goals: She will add back in resistance training after shoulder improves.  Discussed adding in some cardio training.  Behavioral modification strategies: increasing lean protein intake, decreasing simple carbohydrates , planning for success, and keep a strict food journal.  [name] has agreed to follow-up with our  clinic in 3 weeks.  No orders of the defined types were placed in this encounter.   There are no discontinued medications.   No orders of the defined types were placed in this encounter.     Objective:   VITALS: Per patient if applicable, see vitals. GENERAL: Alert and in no acute distress. CARDIOPULMONARY: No increased WOB.  Speaking in clear sentences.  PSYCH: Pleasant and cooperative. Speech normal rate and rhythm. Affect is appropriate. Insight and judgement are appropriate. Attention is focused, linear, and appropriate.  NEURO: Oriented as arrived to appointment on time with no prompting.   Attestation Statements:   Reviewed by clinician on day of visit: allergies, medications, problem list, medical history, surgical history, family history, social history, and previous encounter notes.  Time spent on visit including the items listed below was 30 minutes.  -preparing to see the patient (e.g., review of tests, history, previous notes) -obtaining and/or reviewing separately obtained history -counseling and educating the patient/family/caregiver -documenting clinical information in the electronic or other health record -ordering medications, tests, or procedures -independently interpreting results and communicating results to the patient/ family/caregiver -referring and communicating with other health care professionals  -care coordination    This was prepared with the assistance of [employer].  Occasional wrong-word or sound-a-like substitutions may have occurred due to the inherent limitations of voice recognition software.

## 2023-08-04 ENCOUNTER — Telehealth (INDEPENDENT_AMBULATORY_CARE_PROVIDER_SITE_OTHER): Payer: Federal, State, Local not specified - PPO | Admitting: Family Medicine

## 2023-08-04 ENCOUNTER — Encounter (INDEPENDENT_AMBULATORY_CARE_PROVIDER_SITE_OTHER): Payer: Self-pay | Admitting: Family Medicine

## 2023-08-04 DIAGNOSIS — R002 Palpitations: Secondary | ICD-10-CM

## 2023-08-04 DIAGNOSIS — F5089 Other specified eating disorder: Secondary | ICD-10-CM

## 2023-08-04 DIAGNOSIS — Z6829 Body mass index (BMI) 29.0-29.9, adult: Secondary | ICD-10-CM

## 2023-08-04 DIAGNOSIS — E785 Hyperlipidemia, unspecified: Secondary | ICD-10-CM | POA: Diagnosis not present

## 2023-08-04 DIAGNOSIS — E669 Obesity, unspecified: Secondary | ICD-10-CM

## 2023-08-04 DIAGNOSIS — E7849 Other hyperlipidemia: Secondary | ICD-10-CM

## 2023-08-06 ENCOUNTER — Encounter (INDEPENDENT_AMBULATORY_CARE_PROVIDER_SITE_OTHER): Payer: Self-pay

## 2023-08-19 ENCOUNTER — Encounter: Payer: Self-pay | Admitting: Nurse Practitioner

## 2023-08-19 ENCOUNTER — Ambulatory Visit: Payer: Federal, State, Local not specified - PPO | Admitting: Nurse Practitioner

## 2023-08-19 VITALS — BP 124/70 | HR 70 | Temp 98.4°F | Ht 67.0 in | Wt 200.6 lb

## 2023-08-19 DIAGNOSIS — R221 Localized swelling, mass and lump, neck: Secondary | ICD-10-CM

## 2023-08-19 NOTE — Progress Notes (Signed)
Bethanie Dicker, NP-C Phone: [phone]  Michele [name] is a 32 y.o. female who presents today for lump on neck.   Patient with palpable lump on right posterior side of neck x 6 weeks. Denies tenderness or pain. She is unsure if it has changed in shape or size, she has noticed it more. Denies any new illnesses. Denies fever/chills. Denies headaches, body aches, fatigue. She would like an ultrasound for further evaluation.   Social History   Tobacco Use  Smoking Status Never  Smokeless Tobacco Never    Current Outpatient Medications on File Prior to Visit  Medication Sig Dispense Refill   acetaminophen (TYLENOL) 500 MG tablet Take by mouth.     Cholecalciferol 25 MCG (1000 UT) capsule Take 1,000 Units by mouth daily.     Coenzyme Q10 (COQ-10 PO) Take by mouth.     COLLAGEN PO Take by mouth.     Fluticasone Furoate (ARNUITY ELLIPTA) 100 MCG/ACT AEPB Inhale 1 puff into the lungs daily. 30 each 6   levalbuterol (XOPENEX HFA) 45 MCG/ACT inhaler Inhale 2 puffs into the lungs every 6 (six) hours as needed for wheezing. 1 each 2   Magnesium 500 MG TABS Take 1,000 mg by mouth daily.     metoprolol succinate (TOPROL-XL) 25 MG 24 hr tablet Take 25 mg by mouth daily.     Multiple Vitamin (MULTIVITAMIN ADULT PO) Take by mouth.     Omega-3 Fatty Acids (FISH OIL) 1000 MG CAPS Take 1 capsule by mouth daily.     omeprazole (PRILOSEC) 20 MG capsule Take 20 mg by mouth as needed.  1   psyllium (METAMUCIL) 58.6 % powder Take 1 packet by mouth daily.     No current facility-administered medications on file prior to visit.   ROS see history of present illness  Objective  Physical Exam Vitals:   08/19/23 1450  BP: 124/70  Pulse: 70  Temp: 98.4 F (36.9 C)  SpO2: 99%    BP Readings from Last 3 Encounters:  08/19/23 124/70  05/22/23 134/65  03/30/23 136/81   Wt Readings from Last 3 Encounters:  08/19/23 200 lb 9.6 oz (91 kg)  06/23/23 187 lb (84.8 kg)  03/30/23 184 lb 6.4 oz (83.6  kg)    Physical Exam Constitutional:      General: She is not in acute distress.    Appearance: Normal appearance.  HENT:     Head: Normocephalic.  Cardiovascular:     Rate and Rhythm: Normal rate and regular rhythm.     Heart sounds: Normal heart sounds.  Pulmonary:     Effort: Pulmonary effort is normal.     Breath sounds: Normal breath sounds.  Lymphadenopathy:     Cervical: Cervical adenopathy (right side, posterior. pea sized, soft, moveable) present.  Skin:    General: Skin is warm and dry.  Neurological:     General: No focal deficit present.     Mental Status: She is alert.  Psychiatric:        Mood and Affect: Mood normal.        Behavior: Behavior normal.    Assessment/Plan: Please see individual problem list.  Palpable mass of neck Assessment & Plan: Concern for enlarged posterior cervical lymph node. Will check CBC today. Patient would like to proceed with [place] of soft tissue neck. Order placed. Return precautions given to patient. Will contact patient with results.   Orders: -     CBC with Differential/Platelet -     US  SOFT TISSUE HEAD & NECK (NON-THYROID); Future   Return if symptoms worsen or fail to improve.   Bethanie Dicker, NP-C [facility] - [employer]

## 2023-08-19 NOTE — Assessment & Plan Note (Addendum)
Concern for enlarged posterior cervical lymph node. Will check CBC today. Patient would like to proceed with [place] of soft tissue neck. Order placed. Return precautions given to patient. Will contact patient with results.

## 2023-08-20 LAB — CBC WITH DIFFERENTIAL/PLATELET
Basophils Absolute: 0.1 10*3/uL (ref 0.0–0.1)
Basophils Relative: 1.6 % (ref 0.0–3.0)
Eosinophils Absolute: 0.3 10*3/uL (ref 0.0–0.7)
Eosinophils Relative: 3.4 % (ref 0.0–5.0)
HCT: 41.1 % (ref 36.0–46.0)
Hemoglobin: 13.7 g/dL (ref 12.0–15.0)
Lymphocytes Relative: 41.5 % (ref 12.0–46.0)
Lymphs Abs: 3.2 10*3/uL (ref 0.7–4.0)
MCHC: 33.5 g/dL (ref 30.0–36.0)
MCV: 97.8 fl (ref 78.0–100.0)
Monocytes Absolute: 0.4 10*3/uL (ref 0.1–1.0)
Monocytes Relative: 5.7 % (ref 3.0–12.0)
Neutro Abs: 3.7 10*3/uL (ref 1.4–7.7)
Neutrophils Relative %: 47.8 % (ref 43.0–77.0)
Platelets: 281 10*3/uL (ref 150.0–400.0)
RBC: 4.2 Mil/uL (ref 3.87–5.11)
RDW: 12.5 % (ref 11.5–15.5)
WBC: 7.7 10*3/uL (ref 4.0–10.5)

## 2023-08-20 NOTE — Progress Notes (Signed)
TeleHealth Visit:  This visit was completed with telemedicine (audio/video) technology. [name] has verbally consented to this TeleHealth visit. The patient is located at home, the provider is located at home. The participants in this visit include the listed provider and patient. The visit was conducted today via MyChart video.  OBESITY Michele [name] is here to discuss her progress with her obesity treatment plan along with follow-up of her obesity related diagnoses.   Today's visit was # 19 Starting weight: 220 lbs Starting date: 04/09/23 Weight at last in office visit: 184 lbs on 03/30/23 Total weight loss: 36 lbs at last in office visit on 03/30/23. Today's reported weight (08/25/23):  200 lbs at medical visit on 08/19/2023.  Nutrition Plan: the Category 3 plan and keeping a food journal with goal of 1500-1800 calories and 110 grams of protein daily  Current exercise:  Yoga 5 days per week for 30 minutes. Weight lifting 3 days per week.  Interim History:  She is getting in most of the protein. Following cat 3 rather than journaling. She feels she has done better with following the plan more closely the last few weeks.  She weighed 200 pounds at a medical visit last week which has motivated her to stick to plan more closely. She struggles with snacking as soon as she gets home from work and also with finding something to eat for dinner.  Her husband works evening shifts so it is only her and her 49-year-old son at home so she generally does not cook.  Assessment/Plan:  1. Eating disorder/emotional eating Asli has had issues with stress/emotional eating. This often happens in the evening, when she first gets home from work in particular. She is also attempted when she meets her husband for coffee at lunch and they are pastries available. Currently this is poorly controlled. Overall mood is stable. Medication(s): Declines medication.  Plan: Discussed packing a snack such as an apple or  Whisps to eat on her way home from work. She will pay closer attention to keeping snack calories less than 300/day.   2. Generalized Obesity: Current BMI 31  [name] is currently in the action stage of change. As such, her goal is to continue with weight loss efforts.  She has agreed to the Category 3 plan and journaling dinner with goal of 450-600 calories and 40 grams of protein.  1.  Discussed packing snack to eat on her way home from work. 2.  Discussed easy meal options for dinner. 3.  Begin journaling dinner. 4.  Work on keeping snack calories less than 300/day.  Exercise goals:  as is  Behavioral modification strategies: increasing lean protein intake, decreasing simple carbohydrates , meal planning , planning for success, and increasing vegetables.  [name] has agreed to follow-up with our clinic in 4 weeks.  No orders of the defined types were placed in this encounter.   There are no discontinued medications.   No orders of the defined types were placed in this encounter.     Objective:   VITALS: Per patient if applicable, see vitals. GENERAL: Alert and in no acute distress. CARDIOPULMONARY: No increased WOB. Speaking in clear sentences.  PSYCH: Pleasant and cooperative. Speech normal rate and rhythm. Affect is appropriate. Insight and judgement are appropriate. Attention is focused, linear, and appropriate.  NEURO: Oriented as arrived to appointment on time with no prompting.   Attestation Statements:   Reviewed by clinician on day of visit: allergies, medications, problem list, medical history, surgical history, family history, social  history, and previous encounter notes.   This was prepared with the assistance of Engineer, civil (consulting).  Occasional wrong-word or sound-a-like substitutions may have occurred due to the inherent limitations of voice recognition software.

## 2023-08-21 ENCOUNTER — Telehealth: Payer: Self-pay | Admitting: Internal Medicine

## 2023-08-21 NOTE — Telephone Encounter (Signed)
Lft pt vm to call ofc to sch [place]. Thanks

## 2023-08-25 ENCOUNTER — Telehealth (INDEPENDENT_AMBULATORY_CARE_PROVIDER_SITE_OTHER): Payer: Federal, State, Local not specified - PPO | Admitting: Family Medicine

## 2023-08-25 ENCOUNTER — Encounter (INDEPENDENT_AMBULATORY_CARE_PROVIDER_SITE_OTHER): Payer: Self-pay | Admitting: Family Medicine

## 2023-08-25 DIAGNOSIS — Z6831 Body mass index (BMI) 31.0-31.9, adult: Secondary | ICD-10-CM | POA: Diagnosis not present

## 2023-08-25 DIAGNOSIS — F5089 Other specified eating disorder: Secondary | ICD-10-CM

## 2023-08-25 DIAGNOSIS — E669 Obesity, unspecified: Secondary | ICD-10-CM

## 2023-08-26 ENCOUNTER — Telehealth: Payer: Self-pay | Admitting: Internal Medicine

## 2023-08-26 DIAGNOSIS — N8110 Cystocele, unspecified: Secondary | ICD-10-CM | POA: Diagnosis not present

## 2023-08-26 DIAGNOSIS — Z87828 Personal history of other (healed) physical injury and trauma: Secondary | ICD-10-CM | POA: Diagnosis not present

## 2023-08-26 DIAGNOSIS — Z8759 Personal history of other complications of pregnancy, childbirth and the puerperium: Secondary | ICD-10-CM | POA: Diagnosis not present

## 2023-08-26 NOTE — Telephone Encounter (Signed)
Lft pt vm to call ofc to sch [place]. thanks ?

## 2023-08-27 ENCOUNTER — Telehealth: Payer: Self-pay | Admitting: Internal Medicine

## 2023-08-27 NOTE — Telephone Encounter (Signed)
Lft pt vm to call ofc to sch [place]. Thanks

## 2023-08-31 ENCOUNTER — Other Ambulatory Visit (HOSPITAL_COMMUNITY)
Admission: RE | Admit: 2023-08-31 | Discharge: 2023-08-31 | Disposition: A | Discharge status: Home / Self Care | Payer: Federal, State, Local not specified - PPO | Source: Ambulatory Visit | Attending: Internal Medicine | Admitting: Internal Medicine

## 2023-08-31 ENCOUNTER — Ambulatory Visit (INDEPENDENT_AMBULATORY_CARE_PROVIDER_SITE_OTHER): Payer: Federal, State, Local not specified - PPO | Admitting: Internal Medicine

## 2023-08-31 VITALS — BP 118/72 | HR 73 | Temp 97.9°F | Resp 16 | Ht 67.0 in | Wt 197.6 lb

## 2023-08-31 DIAGNOSIS — R32 Unspecified urinary incontinence: Secondary | ICD-10-CM

## 2023-08-31 DIAGNOSIS — R002 Palpitations: Secondary | ICD-10-CM

## 2023-08-31 DIAGNOSIS — R221 Localized swelling, mass and lump, neck: Secondary | ICD-10-CM

## 2023-08-31 DIAGNOSIS — D649 Anemia, unspecified: Secondary | ICD-10-CM | POA: Diagnosis not present

## 2023-08-31 DIAGNOSIS — O903 Peripartum cardiomyopathy: Secondary | ICD-10-CM

## 2023-08-31 DIAGNOSIS — Z124 Encounter for screening for malignant neoplasm of cervix: Secondary | ICD-10-CM

## 2023-08-31 DIAGNOSIS — E041 Nontoxic single thyroid nodule: Secondary | ICD-10-CM

## 2023-08-31 DIAGNOSIS — Z0000 Encounter for general adult medical examination without abnormal findings: Secondary | ICD-10-CM | POA: Diagnosis not present

## 2023-08-31 DIAGNOSIS — Z23 Encounter for immunization: Secondary | ICD-10-CM | POA: Diagnosis not present

## 2023-08-31 DIAGNOSIS — E559 Vitamin D deficiency, unspecified: Secondary | ICD-10-CM

## 2023-08-31 DIAGNOSIS — E7800 Pure hypercholesterolemia, unspecified: Secondary | ICD-10-CM

## 2023-08-31 NOTE — Assessment & Plan Note (Addendum)
Physical today 08/31/23.  Colonoscopy 04/23/21.  PAP 04/30/20 - pap negative with negative HPV.  PAP today.  Negative GC and chlamydia - last pap.

## 2023-08-31 NOTE — Progress Notes (Signed)
Subjective:    Patient ID: Michele [name], female    DOB: Mar 05, 1991, 32 y.o.   MRN: [id number]  Patient here for  Chief Complaint  Patient presents with   Annual Exam    HPI Here for a physical exam. Evaluated - work in 08/19/23 - palpable mass of neck. Ultrasound ordered. Has not been scheduled.  Persistent. No sore throat.  No sinus or allergy symptoms.  Saw cardiology 06/10/23 - f/u.  Reported palpitations and PVC noted on her home Alive Cor device.  Zio monitor placed. Feels from a cardiac standpoint - doing well.  Had f/u with Dr Gershon Crane (11/27/22) - f/u thyroid nodule. Ultrasound - stable. Recommended f/u ultrasound in 2 year.  Evaluated - gyn - recommended PFT for bladder leakage.     Past Medical History:  Diagnosis Date   Arrhythmia    Arthritis    Asthma    Chest pain    CHF (congestive heart failure) (HCC)    GERD (gastroesophageal reflux disease)    History of chicken pox    Hx of blood clots    Hx of migraines    Hx: UTI (urinary tract infection)    Hypercholesteremia    Hyperlipidemia    Low back pain    Osteoarthritis    Palpitations    Postpartum cardiomyopathy    Sleep apnea    Thyroid nodule    Past Surgical History:  Procedure Laterality Date   ABLATION     TONSILLECTOMY AND ADENOIDECTOMY  12/23/1995   Family History  Problem Relation Age of Onset   Hypertension Mother    Diabetes Mother    High Cholesterol Mother    Hyperlipidemia Father    Hypertension Father    Breast cancer Maternal Grandmother 67   Other Maternal Grandmother        plasmacytoma of eye dx 45   Prostate cancer Maternal Grandfather    Heart disease Maternal Grandfather    Hypertension Maternal Grandfather    Diabetes Maternal Grandfather    Lymphoma Maternal Grandfather    Breast cancer Paternal Grandmother    Hyperlipidemia Paternal Grandmother    Hypertension Paternal Grandmother    Diabetes Paternal Grandmother    Mental illness Paternal Grandmother     Hyperlipidemia Paternal Grandfather    Stroke Paternal Grandfather    Hypertension Paternal Grandfather    Prostate cancer Paternal Grandfather    Breast cancer Other        mat great aunt + pat great aunt   Social History   Socioeconomic History   Marital status: Married    Spouse name: Ronaldo Miyamoto   Number of children: 0   Years of education: Not on file   Highest education level: Not on file  Occupational History   Occupation: PTA  Tobacco Use   Smoking status: Never   Smokeless tobacco: Never  Vaping Use   Vaping status: Never Used  Substance and Sexual Activity   Alcohol use: Yes    Alcohol/week: 0.0 standard drinks of alcohol   Drug use: No   Sexual activity: Yes  Other Topics Concern   Not on file  Social History Narrative   PT assistant at assisted living; lives in [place]; 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    Social Determinants of Health   Financial Resource Strain: Medium Risk (02/14/2023)   Received from [facility], [facility]   Overall Financial Resource Strain (CARDIA)    Difficulty of Paying Living Expenses: Somewhat  hard  Food Insecurity: No Food Insecurity (02/14/2023)   Received from [facility], [facility]   Hunger Vital Sign    Worried About Running Out of Food in the Last Year: Never true    Ran Out of Food in the Last Year: Never true  Transportation Needs: No Transportation Needs (02/14/2023)   Received from [facility], [facility]   [facility] - Transportation    Lack of Transportation (Medical): No    Lack of Transportation (Non-Medical): No  Physical Activity: Not on file  Stress: Not on file  Social Connections: Not on file     Review of Systems  Constitutional:  Negative for appetite change and unexpected weight change.  HENT:  Negative for congestion, sinus pressure and sore throat.   Eyes:  Negative for pain and visual disturbance.  Respiratory:  Negative for cough, chest tightness and shortness of  breath.   Cardiovascular:  Negative for chest pain, palpitations and leg swelling.  Gastrointestinal:  Negative for abdominal pain, diarrhea, nausea and vomiting.  Genitourinary:  Negative for difficulty urinating and dysuria.  Musculoskeletal:  Negative for joint swelling and myalgias.  Skin:  Negative for color change and rash.  Neurological:  Negative for dizziness and headaches.  Hematological:  Negative for adenopathy. Does not bruise/bleed easily.  Psychiatric/Behavioral:  Negative for agitation and dysphoric mood.        Objective:     BP 118/72   Pulse 73   Temp 97.9 F (36.6 C)   Resp 16   Ht 5\' 7"  (1.702 m)   Wt 197 lb 9.6 oz (89.6 kg)   SpO2 99%   BMI 30.95 kg/m  Wt Readings from Last 3 Encounters:  08/31/23 197 lb 9.6 oz (89.6 kg)  08/19/23 200 lb 9.6 oz (91 kg)  06/23/23 187 lb (84.8 kg)    Physical Exam Vitals reviewed.  Constitutional:      General: She is not in acute distress.    Appearance: Normal appearance. She is well-developed.  HENT:     Head: Normocephalic and atraumatic.     Right Ear: External ear normal.     Left Ear: External ear normal.  Eyes:     General: No scleral icterus.       Right eye: No discharge.        Left eye: No discharge.     Conjunctiva/sclera: Conjunctivae normal.  Neck:     Thyroid: No thyromegaly.  Cardiovascular:     Rate and Rhythm: Normal rate and regular rhythm.  Pulmonary:     Effort: No tachypnea, accessory muscle usage or respiratory distress.     Breath sounds: Normal breath sounds. No decreased breath sounds or wheezing.  Chest:  Breasts:    Right: No inverted nipple, mass, nipple discharge or tenderness (no axillary adenopathy).     Left: No inverted nipple, mass, nipple discharge or tenderness (no axilarry adenopathy).  Abdominal:     General: Bowel sounds are normal.     Palpations: Abdomen is soft.     Tenderness: There is no abdominal tenderness.  Genitourinary:    Comments: Normal external  genitalia.  Vaginal vault without lesions.  Cervix identified.  Pap smear performed.  Could not appreciate any adnexal masses or tenderness.   Musculoskeletal:        General: No swelling or tenderness.     Cervical back: Neck supple.  Lymphadenopathy:     Cervical: No cervical adenopathy.  Skin:  Findings: No erythema or rash.  Neurological:     Mental Status: She is alert and oriented to person, place, and time.  Psychiatric:        Mood and Affect: Mood normal.        Behavior: Behavior normal.      Outpatient Encounter Medications as of 08/31/2023  Medication Sig   acetaminophen (TYLENOL) 500 MG tablet Take by mouth.   Cholecalciferol 25 MCG (1000 UT) capsule Take 1,000 Units by mouth daily.   Coenzyme Q10 (COQ-10 PO) Take by mouth.   COLLAGEN PO Take by mouth.   Fluticasone Furoate (ARNUITY ELLIPTA) 100 MCG/ACT AEPB Inhale 1 puff into the lungs daily.   levalbuterol (XOPENEX HFA) 45 MCG/ACT inhaler Inhale 2 puffs into the lungs every 6 (six) hours as needed for wheezing.   Magnesium 500 MG TABS Take 1,000 mg by mouth daily.   metoprolol succinate (TOPROL-XL) 25 MG 24 hr tablet Take 25 mg by mouth daily.   Multiple Vitamin (MULTIVITAMIN ADULT PO) Take by mouth.   Omega-3 Fatty Acids (FISH OIL) 1000 MG CAPS Take 1 capsule by mouth daily.   omeprazole (PRILOSEC) 20 MG capsule Take 20 mg by mouth as needed.   psyllium (METAMUCIL) 58.6 % powder Take 1 packet by mouth daily.   No facility-administered encounter medications on file as of 08/31/2023.     Lab Results  Component Value Date   WBC 7.7 08/19/2023   HGB 13.7 08/19/2023   HCT 41.1 08/19/2023   PLT 281.0 08/19/2023   GLUCOSE 97 05/22/2023   CHOL 191 12/04/2022   TRIG 87 12/04/2022   HDL 59 12/04/2022   LDLCALC 116 (H) 12/04/2022   ALT 15 05/22/2023   AST 16 05/22/2023   NA 137 05/22/2023   K 4.0 05/22/2023   CL 103 05/22/2023   CREATININE 0.75 05/22/2023   BUN 20 05/22/2023   CO2 25 05/22/2023   TSH 2.120  04/08/2022   HGBA1C 5.2 12/04/2022    US Venous Img Lower Unilateral Left  Result Date: 05/22/2023 CLINICAL DATA:  Left leg pain cramping and edema for 4 days. EXAM: Left LOWER EXTREMITY VENOUS DOPPLER ULTRASOUND TECHNIQUE: Gray-scale sonography with compression, as well as color and duplex ultrasound, were performed to evaluate the deep venous system(s) from the level of the common femoral vein through the popliteal and proximal calf veins. COMPARISON:  12/26/2020 ultrasound FINDINGS: VENOUS Normal compressibility of the common femoral, superficial femoral, and popliteal veins, as well as the visualized calf veins. Visualized portions of profunda femoral vein and great saphenous vein unremarkable. No filling defects to suggest DVT on grayscale or color Doppler imaging. Doppler waveforms show normal direction of venous flow, normal respiratory plasticity and response to augmentation. Limited views of the contralateral common femoral vein are unremarkable. OTHER None. Limitations: none IMPRESSION: No evidence of left lower extremity DVT Electronically Signed   By: Karen Kays M.D.   On: 05/22/2023 18:34       Assessment & Plan:  Routine general medical examination at a health care facility  Health care maintenance Assessment & Plan: Physical today 08/31/23.  Colonoscopy 04/23/21.  PAP 04/30/20 - pap negative with negative HPV.  PAP today.  Negative GC and chlamydia - last pap.    Screening for cervical cancer -     Cytology - PAP  Encounter for immunization -     Flu vaccine trivalent PF, 6mos and older(Flulaval,Afluria,Fluarix,Fluzone)  Anemia, unspecified type Assessment & Plan: Follow cbc.    Hypercholesterolemia Assessment &  Plan: Low cholesterol diet and exercise.  Follow lipid panel.    Palpable mass of neck Assessment & Plan: Palpable area - fullness - right posterior cervical region.  No pain.  Has had thyroid ultrasound.  Ultrasound neck ordered.  F/u with scheduling.     Palpitations Assessment & Plan: S/p ablation and doing well.  Follow.    Postpartum cardiomyopathy Assessment & Plan: F/u with cardiology 02/19/23 - doing well. Recommended decrease metoprolol to 25mg  q day.  Plan f/u in one year.  No chest pain or sob reported.    Thyroid nodule Assessment & Plan:  Had f/u with Dr Gershon Crane (11/27/22) - f/u thyroid nodule.  Ultrasound - stable.  Recommended f/u ultrasound in 2 year.    Vitamin D deficiency Assessment & Plan: Vitamin D level 11/2022 - wnl.    Bladder leak Assessment & Plan: Noticed some urina leakage - bladder.  Saw gyn.  Recommended PFT.  Discussed.  Refer locally.    Orders: -     Ambulatory referral to Physical Therapy     D[place], MD

## 2023-09-02 LAB — CYTOLOGY - PAP
Comment: NEGATIVE
Diagnosis: NEGATIVE
High risk HPV: NEGATIVE

## 2023-09-06 ENCOUNTER — Encounter: Payer: Self-pay | Admitting: Internal Medicine

## 2023-09-06 DIAGNOSIS — R32 Unspecified urinary incontinence: Secondary | ICD-10-CM | POA: Insufficient documentation

## 2023-09-06 NOTE — Assessment & Plan Note (Signed)
Palpable area - fullness - right posterior cervical region.  No pain.  Has had thyroid ultrasound.  Ultrasound neck ordered.  F/u with scheduling.

## 2023-09-06 NOTE — Assessment & Plan Note (Signed)
Vitamin D level 11/2022 - wnl.

## 2023-09-06 NOTE — Assessment & Plan Note (Signed)
F/u with cardiology 02/19/23 - doing well. Recommended decrease metoprolol to 25mg  q day.  Plan f/u in one year.  No chest pain or sob reported.

## 2023-09-06 NOTE — Assessment & Plan Note (Signed)
S/p ablation and doing well.  Follow.

## 2023-09-06 NOTE — Assessment & Plan Note (Signed)
Noticed some urina leakage - bladder.  Saw gyn.  Recommended PFT.  Discussed.  Refer locally.

## 2023-09-06 NOTE — Assessment & Plan Note (Signed)
Had f/u with Dr Gershon Crane (11/27/22) - f/u thyroid nodule.  Ultrasound - stable.  Recommended f/u ultrasound in 2 year.

## 2023-09-06 NOTE — Assessment & Plan Note (Signed)
Low cholesterol diet and exercise.  Follow lipid panel.   

## 2023-09-06 NOTE — Assessment & Plan Note (Signed)
Follow cbc.  

## 2023-09-11 ENCOUNTER — Ambulatory Visit
Admission: RE | Admit: 2023-09-11 | Discharge: 2023-09-11 | Disposition: A | Discharge status: Home / Self Care | Payer: Federal, State, Local not specified - PPO | Source: Ambulatory Visit | Attending: Nurse Practitioner

## 2023-09-11 DIAGNOSIS — R221 Localized swelling, mass and lump, neck: Secondary | ICD-10-CM | POA: Diagnosis not present

## 2023-09-22 ENCOUNTER — Ambulatory Visit (INDEPENDENT_AMBULATORY_CARE_PROVIDER_SITE_OTHER): Payer: Federal, State, Local not specified - PPO | Admitting: Family Medicine

## 2023-09-22 ENCOUNTER — Encounter (INDEPENDENT_AMBULATORY_CARE_PROVIDER_SITE_OTHER): Payer: Self-pay | Admitting: Family Medicine

## 2023-09-22 VITALS — BP 126/87 | HR 82 | Temp 98.5°F | Ht 67.0 in | Wt 195.0 lb

## 2023-09-22 DIAGNOSIS — E669 Obesity, unspecified: Secondary | ICD-10-CM | POA: Diagnosis not present

## 2023-09-22 DIAGNOSIS — Z6830 Body mass index (BMI) 30.0-30.9, adult: Secondary | ICD-10-CM | POA: Diagnosis not present

## 2023-09-22 DIAGNOSIS — Z6831 Body mass index (BMI) 31.0-31.9, adult: Secondary | ICD-10-CM

## 2023-09-22 DIAGNOSIS — E559 Vitamin D deficiency, unspecified: Secondary | ICD-10-CM

## 2023-09-22 DIAGNOSIS — E88819 Insulin resistance, unspecified: Secondary | ICD-10-CM

## 2023-09-22 NOTE — Progress Notes (Signed)
Michele [name], D.O.  ABFM, ABOM Specializing in Clinical Bariatric Medicine  Office located at: [address] [place], [place]  [address]     Assessment and [name]:   Insulin resistance Assessment & [name]: Last Hemoglobin A1c and Fasting Insulin on 12/04/2022 was 5.2 & 4.4 respectively. Pt does endorse having some sweet cravings. Pt declines initiating any medications today; handout on Metformin still provided. Reminded pt that having protein with each meal is important for controlling hunger and cravings. Continue to decrease simple carbs/ sugars; increase fiber and proteins -> follow her meal [name].    Vitamin D deficiency Assessment & [name]: Last vitamin D levels was 56.4 roughly 9 mos ago. For the last 60+ days, pt endorses  taking OTC Cholecalciferol 1,000 units daily without any adverse SE. Will recheck levels in the near future.    BMI 31.0-31.9,adult - current BMI 30.3 Generalized obesity: Starting BMI of 34.4 Assessment & [name]: Since last office visit on 03/30/23 patient's muscle mass has increased by 1.6 lb. Fat mass has increased by 8.8 lb. Total body water has increased by 3.8 lb.  Counseling done on how various foods will affect these numbers and how to maximize success  Total lbs lost to date: 25 lbs  Total weight loss percentage to date: 11.36%  Meal Planning: Begin journaling 1500-1600 calories & 110++ grams proteins with the Category 3 meal [name] as a guide.   I discussed with pt the potential benefits of tracking the foods they eat and drink. Journaling can help one further understand their eating habits and patterns and it's a great tool to make more conscious meal choices throughout the day.    Behavioral Intervention Additional resources provided today: Food journaling [name] information Evidence-based interventions for health behavior change were utilized today including the discussion of self monitoring techniques, problem-solving barriers and SMART goal  setting techniques.   Regarding patient's less desirable eating habits and patterns, we employed the technique of small changes.  Goal: bring in food log & drinking 100 ounces of water daily  for next visit.    FOLLOW UP: Return in 6 wks. She was informed of the importance of frequent follow up visits to maximize her success with intensive lifestyle modifications for her multiple health conditions.  Subjective:   Chief complaint: Obesity Michele [name]. She is on the Category 3 [name] with B/L options and states she is following her eating [name] approximately 80% of the time. She states she is doing cardio/weights 40 minutes 5 days per week.  Interval History:  Michele [name] is here for a follow up office visit. Since last OV, Michele [name] has been doing well. She reports doing well with her protein intake, however she lately endorses eating more sweets from a coffee shop. Endorses needing to get more "focused & serious" with healthy eating. Pt drinking 60-80 ounces of water daily.   Review of Systems:  Pertinent positives were addressed with patient today.  Reviewed by clinician on day of visit: allergies, medications, problem list, medical history, surgical history, family history, social history, and previous encounter notes.  Weight Summary and Biometrics   Weight Lost Since Last Visit: 0lb  Weight Gained Since Last Visit: 11lb   Vitals Temp: 98.5 F (36.9 C) BP: 126/87 Pulse Rate: 82 SpO2: 100 %   Anthropometric Measurements Height: 5\' 7"  (1.702 m) Weight: 195 lb (88.5 kg) BMI (Calculated): 30.53 Weight at Last Visit: 184lb Weight  Lost Since Last Visit: 0lb Weight Gained Since Last Visit: 11lb Starting Weight: 220lb Total Weight Loss (lbs): 25 lb (11.3 kg) Peak Weight: 245lb   Body Composition  Body Fat %: 37.4 % Fat Mass (lbs): 73 lbs Muscle Mass (lbs): 115.8 lbs Total Body Water (lbs): 82 lbs Visceral Fat  Rating : 7   Other Clinical Data Fasting: no Labs: no Today's Visit #: 20 Starting Date: 04/08/22   Objective:   PHYSICAL EXAM: Blood pressure 126/87, pulse 82, temperature 98.5 F (36.9 C), height 5\' 7"  (1.702 m), weight 195 lb (88.5 kg), SpO2 100%. Body mass index is 30.54 kg/m.  General: Well Developed, well nourished, and in no acute distress.  HEENT: Normocephalic, atraumatic Skin: Warm and dry, cap RF less 2 sec, good turgor Chest:  Normal excursion, shape, no gross abn Respiratory: speaking in full sentences, no conversational dyspnea NeuroM-Sk: Ambulates w/o assistance, moves * 4 Psych: A and O *3, insight good, mood-full  DIAGNOSTIC DATA REVIEWED:  BMET    Component Value Date/Time   NA 137 05/22/2023 1705   NA 143 04/08/2022 0956   K 4.0 05/22/2023 1705   CL 103 05/22/2023 1705   CO2 25 05/22/2023 1705   GLUCOSE 97 05/22/2023 1705   BUN 20 05/22/2023 1705   BUN 9 04/08/2022 0956   CREATININE 0.75 05/22/2023 1705   CALCIUM 8.8 (L) 05/22/2023 1705   GFRNONAA >60 05/22/2023 1705   Lab Results  Component Value Date   HGBA1C 5.2 12/04/2022   HGBA1C 5.0 08/04/2016   Lab Results  Component Value Date   INSULIN 4.4 12/04/2022   INSULIN 8.7 04/08/2022   Lab Results  Component Value Date   TSH 2.120 04/08/2022   CBC    Component Value Date/Time   WBC 7.7 08/19/2023 1510   RBC 4.20 08/19/2023 1510   HGB 13.7 08/19/2023 1510   HGB 15.4 02/08/2020 1405   HCT 41.1 08/19/2023 1510   HCT 43.7 02/08/2020 1405   PLT 281.0 08/19/2023 1510   PLT 319 02/08/2020 1405   MCV 97.8 08/19/2023 1510   MCV 94 02/08/2020 1405   MCH 32.3 05/22/2023 1705   MCHC 33.5 08/19/2023 1510   RDW 12.5 08/19/2023 1510   RDW 12.0 02/08/2020 1405   Iron Studies No results found for: "IRON", "TIBC", "FERRITIN", "IRONPCTSAT" Lipid Panel     Component Value Date/Time   CHOL 191 12/04/2022 1042   TRIG 87 12/04/2022 1042   HDL 59 12/04/2022 1042   CHOLHDL 3.2 12/04/2022  1042   CHOLHDL 4 08/28/2022 0830   VLDL 16.0 08/28/2022 0830   LDLCALC 116 (H) 12/04/2022 1042   Hepatic Function Panel     Component Value Date/Time   PROT 7.8 05/22/2023 1705   PROT 7.4 04/08/2022 0956   ALBUMIN 4.5 05/22/2023 1705   ALBUMIN 4.6 04/08/2022 0956   AST 16 05/22/2023 1705   ALT 15 05/22/2023 1705   ALKPHOS 47 05/22/2023 1705   BILITOT 0.5 05/22/2023 1705   BILITOT 0.5 04/08/2022 0956      Component Value Date/Time   TSH 2.120 04/08/2022 0956   Nutritional Lab Results  Component Value Date   VD25OH 56.4 12/04/2022   VD25OH 40.5 04/08/2022    Attestations:   I, Special Puri, acting as a Stage manager for [employer], DO., have compiled all relevant documentation for today's office visit on behalf of Thomasene Lot, DO, while in the presence of [employer], DO.  I have reviewed the above documentation for accuracy  and completeness, and I agree with the above. Michele [name], D.O.  The 21st Century Cures Act was signed into law in 2016 which includes the topic of electronic health records.  This provides immediate access to information in MyChart.  This includes consultation notes, operative notes, office notes, lab results and pathology reports.  If you have any questions about what you read please let us know at your next visit so we can discuss your concerns and take corrective action if need be.  We are right here with you.

## 2023-10-21 DIAGNOSIS — D225 Melanocytic nevi of trunk: Secondary | ICD-10-CM | POA: Diagnosis not present

## 2023-10-21 DIAGNOSIS — L570 Actinic keratosis: Secondary | ICD-10-CM | POA: Diagnosis not present

## 2023-10-21 DIAGNOSIS — D2262 Melanocytic nevi of left upper limb, including shoulder: Secondary | ICD-10-CM | POA: Diagnosis not present

## 2023-10-21 DIAGNOSIS — D2261 Melanocytic nevi of right upper limb, including shoulder: Secondary | ICD-10-CM | POA: Diagnosis not present

## 2023-10-21 DIAGNOSIS — Z85828 Personal history of other malignant neoplasm of skin: Secondary | ICD-10-CM | POA: Diagnosis not present

## 2023-11-03 ENCOUNTER — Ambulatory Visit (INDEPENDENT_AMBULATORY_CARE_PROVIDER_SITE_OTHER): Payer: Federal, State, Local not specified - PPO | Admitting: Family Medicine

## 2023-11-03 ENCOUNTER — Encounter (INDEPENDENT_AMBULATORY_CARE_PROVIDER_SITE_OTHER): Payer: Self-pay | Admitting: Family Medicine

## 2023-11-03 VITALS — BP 119/76 | HR 75 | Temp 98.5°F | Ht 67.0 in | Wt 194.0 lb

## 2023-11-03 DIAGNOSIS — F5089 Other specified eating disorder: Secondary | ICD-10-CM | POA: Diagnosis not present

## 2023-11-03 DIAGNOSIS — E559 Vitamin D deficiency, unspecified: Secondary | ICD-10-CM

## 2023-11-03 DIAGNOSIS — E669 Obesity, unspecified: Secondary | ICD-10-CM

## 2023-11-03 DIAGNOSIS — Z6830 Body mass index (BMI) 30.0-30.9, adult: Secondary | ICD-10-CM

## 2023-11-03 DIAGNOSIS — Z6831 Body mass index (BMI) 31.0-31.9, adult: Secondary | ICD-10-CM

## 2023-11-03 DIAGNOSIS — E88819 Insulin resistance, unspecified: Secondary | ICD-10-CM | POA: Diagnosis not present

## 2023-11-03 DIAGNOSIS — N814 Uterovaginal prolapse, unspecified: Secondary | ICD-10-CM | POA: Diagnosis not present

## 2023-11-03 NOTE — Progress Notes (Signed)
[name], D.O.  ABFM, ABOM Specializing in Clinical Bariatric Medicine  Office located at: [address] [place], [place]  [address]   Assessment and Plan:   FOR THE DISEASE OF OBESITY:  Since last office visit on 09/22/23 patient's  Muscle mass has increased by 1 lb. Fat mass has decreased by 1.8 lb. Total body water has decreased by 0.6 lb.  Counseling done on how various foods will affect these numbers and how to maximize success  Total lbs lost to date: 26 lbs  Total weight loss percentage to date: 11.82%    Recommended Dietary Goals Michele [name] is currently in the action stage of change. As such, her goal is to continue weight management plan.  She has agreed to: continue current plan   Behavioral Intervention We discussed the following Behavioral Modification Strategies today: increasing lean protein intake to established goals, work on meal planning and preparation, and work on tracking and journaling calories using tracking application  Additional resources provided today: Handout on traveling and holiday eating strategies and Handout on journaling log.  Evidence-based interventions for health behavior change were utilized today including the discussion of self monitoring techniques, problem-solving barriers and SMART goal setting techniques.   Regarding patient's less desirable eating habits and patterns, we employed the technique of small changes.   Pt will specifically work on: journaling intake/bringing in food log & meal prepping for next visit.    Recommended Physical Activity Goals Michele [name] has been advised to work up to 150 minutes of moderate intensity aerobic activity a week and strengthening exercises 2-3 times per week for cardiovascular health, weight loss maintenance and preservation of muscle mass.   She has agreed to : Continue current level of physical activity    Pharmacotherapy We discussed various medication options to help Michele [name] with her  weight loss efforts and we both agreed to : n/a   FOR ASSOCIATED CONDITIONS ADDRESSED TODAY:  Insulin resistance Assessment & Plan: Most recent fasting insulin of  4.4 on 12/04/2022. No current meds. Diet/exercise controlled. Hunger and cravings are well controlled.   Continue to decrease simple carbs/ sugars; increase fiber and proteins -> follow her meal plan. Strongly recommended pt journals. Will recheck fasting insulin and Hemoglobin A1c as deemed appropriate.    Other disorder of eating/emotional eating Assessment & Plan: Mood is stable. Cravings and hunger are well controlled. No c/o emotional eating since LOV - no concerns in this regard today.   Reminded patient of the importance of following their prudent nutrition plan and how food can affect mood as well to support emotional wellbeing. Continue with weight loss therapy.    Vitamin D deficiency Assessment & Plan: Most recent vitamin D was 56.4 roughly 9 mos ago. No issues with OTC cholecalciferol 1,000 units daily, compliance good.   Continue with their weight loss efforts and ERGO at current dose. Vitamin D; future.    BMI 31.0-31.9,adult - current BMI 30.38 Generalized obesity: Starting BMI of 34.4 Assessment & Plan: Pt was last seen by [facility]hele [name] cardiology on June 2024 for her hx of heart failure. 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 She has been on Metoprolol ever since and has fully recovered her EF. Last TTE was done 2-3 years ago and found to be  normal. During most recent visit with cardiology, she did complain of heart palpitation. She was on [place] [facility]  monitor for 2 weeks and no abnormalities were appreciated. She was told to cut out caffeine and any substances that could elevate her heart rate.  Reminded pt to avoid all caffeine, antihistamines, and continue regular f/up with cardiology. Did discuss with pt  that Metoprolol can be counterproductive to weight loss and I advised her to discuss possible alternatives with cardiology at next OV. Continue heart healthy meal plan.    FOLLOW UP: Return 12/27/22. She was informed of the importance of frequent follow up visits to maximize her success with intensive lifestyle modifications for her multiple health conditions.  Subjective:   Chief complaint: Obesity Michele [name] is here to discuss her progress with her obesity treatment plan. She is on the Category 3 Plan with B/L options and states she is following her eating plan approximately 75% of the time. She states she is doing weight lifting 40 minutes, 4 days a week, cardio or yoga 20 minutes, 5 days a week, and walking 35 minutes, 2-3 days a week.   Interval History:  Michele [name] is here for a follow up office visit. Since LOV, Michele [name] is down 1 lb. She admits to journaling 2 times since 09/22/23. She reports eating outside the home - at mother's house- for dinner almost every day. Averages 3-4 ounces of lean protein at night.   Barriers identified: none  Pharmacotherapy for weight loss: She is currently taking no anti-obesity medication.   Review of Systems:  Pertinent positives were addressed with patient today.  Reviewed by clinician on day of visit: allergies, medications, problem list, medical history, surgical history, family history, social history, and previous encounter notes.  Weight Summary and Biometrics   Weight Lost Since Last Visit: 1lb  Weight Gained Since Last Visit: 0lb   Vitals Temp: 98.5 F (36.9 C) BP: 119/76 Pulse Rate: 75 SpO2: 100 %   Anthropometric Measurements Height: 5\' 7"  (1.702 m) Weight: 194 lb (88 kg) BMI (Calculated): 30.38 Weight at Last Visit: 195lb Weight Lost Since Last Visit: 1lb Weight Gained Since Last Visit: 0lb Starting Weight: 220lb Total Weight Loss (lbs): 26 lb (11.8 kg) Peak Weight: 245lb   Body Composition  Body Fat %: 36.7  % Fat Mass (lbs): 71.2 lbs Muscle Mass (lbs): 116.8 lbs Total Body Water (lbs): 81.4 lbs Visceral Fat Rating : 6   Other Clinical Data Fasting: no Labs: no Today's Visit #: 21 Starting Date: 04/08/22   Objective:   PHYSICAL EXAM: Blood pressure 119/76, pulse 75, temperature 98.5 F (36.9 C), height 5\' 7"  (1.702 m), weight 194 lb (88 kg), SpO2 100%. Body mass index is 30.38 kg/m.  General: she is overweight, cooperative and in no acute distress. PSYCH: Has normal mood, affect and thought process.   HEENT: EOMI, sclerae are anicteric. Lungs: Normal breathing effort, no conversational dyspnea. Extremities: Moves * 4 Neurologic: A and O * 3, good insight  DIAGNOSTIC DATA REVIEWED: BMET    Component Value Date/Time   NA 137 05/22/2023 1705   NA 143 04/08/2022 0956   K 4.0 05/22/2023 1705   CL 103 05/22/2023 1705   CO2 25 05/22/2023 1705   GLUCOSE 97 05/22/2023 1705   BUN 20 05/22/2023 1705   BUN 9 04/08/2022 0956   CREATININE 0.75 05/22/2023 1705   CALCIUM 8.8 (L) 05/22/2023 1705   GFRNONAA >60 05/22/2023 1705   Lab Results  Component Value Date   HGBA1C 5.2 12/04/2022   HGBA1C 5.0 08/04/2016   Lab Results  Component Value Date   INSULIN  4.4 12/04/2022   INSULIN 8.7 04/08/2022   Lab Results  Component Value Date   TSH 2.120 04/08/2022   CBC    Component Value Date/Time   WBC 7.7 08/19/2023 1510   RBC 4.20 08/19/2023 1510   HGB 13.7 08/19/2023 1510   HGB 15.4 02/08/2020 1405   HCT 41.1 08/19/2023 1510   HCT 43.7 02/08/2020 1405   PLT 281.0 08/19/2023 1510   PLT 319 02/08/2020 1405   MCV 97.8 08/19/2023 1510   MCV 94 02/08/2020 1405   MCH 32.3 05/22/2023 1705   MCHC 33.5 08/19/2023 1510   RDW 12.5 08/19/2023 1510   RDW 12.0 02/08/2020 1405   Iron Studies No results found for: "IRON", "TIBC", "FERRITIN", "IRONPCTSAT" Lipid Panel     Component Value Date/Time   CHOL 191 12/04/2022 1042   TRIG 87 12/04/2022 1042   HDL 59 12/04/2022 1042    CHOLHDL 3.2 12/04/2022 1042   CHOLHDL 4 08/28/2022 0830   VLDL 16.0 08/28/2022 0830   LDLCALC 116 (H) 12/04/2022 1042   Hepatic Function Panel     Component Value Date/Time   PROT 7.8 05/22/2023 1705   PROT 7.4 04/08/2022 0956   ALBUMIN 4.5 05/22/2023 1705   ALBUMIN 4.6 04/08/2022 0956   AST 16 05/22/2023 1705   ALT 15 05/22/2023 1705   ALKPHOS 47 05/22/2023 1705   BILITOT 0.5 05/22/2023 1705   BILITOT 0.5 04/08/2022 0956      Component Value Date/Time   TSH 2.120 04/08/2022 0956   Nutritional Lab Results  Component Value Date   VD25OH 56.4 12/04/2022   VD25OH 40.5 04/08/2022    Attestations:   I, Michele [name], acting as a Stage manager for [employer], DO., have compiled all relevant documentation for today's office visit on behalf of Michele Lot, DO, while in the presence of [employer], DO.  I have reviewed the above documentation for accuracy and completeness, and I agree with the above. [name], D.O.  The 21st Century Cures Act was signed into law in 2016 which includes the topic of electronic health records.  This provides immediate access to information in MyChart.  This includes consultation notes, operative notes, office notes, lab results and pathology reports.  If you have any questions about what you read please let us know at your next visit so we can discuss your concerns and take corrective action if need be.  We are right here with you.

## 2023-11-09 IMAGING — CT CT ANGIO CHEST
2 of 7 series · 15 of 36 positions shown · IV contrast (agent unspecified)
Comparison: Radiograph May 14, 2021.

CLINICAL DATA: Shortness of breath, elevated D-dimer level.

EXAM:
CT ANGIOGRAPHY CHEST WITH CONTRAST
TECHNIQUE: Multidetector CT imaging of the chest was performed using the
standard protocol during bolus administration of intravenous
contrast. Multiplanar CT image reconstructions and MIPs were
obtained to evaluate the vascular anatomy.

[Series 5: thins cta pulmonary 1.00 · axial · 0.71mm/px · z∈[-1210,-960]mm · 14 of 363 slices shown]
[im 25/363  lung]
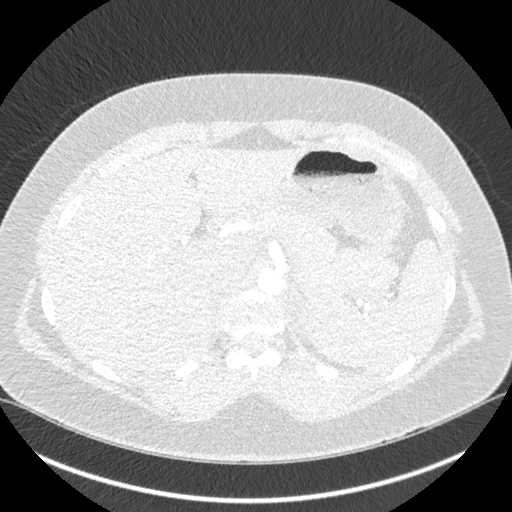
[im 49/363  mediastinal]
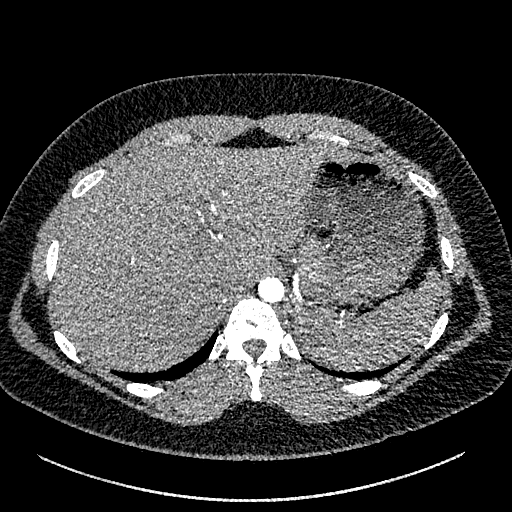
[im 73/363  lung]
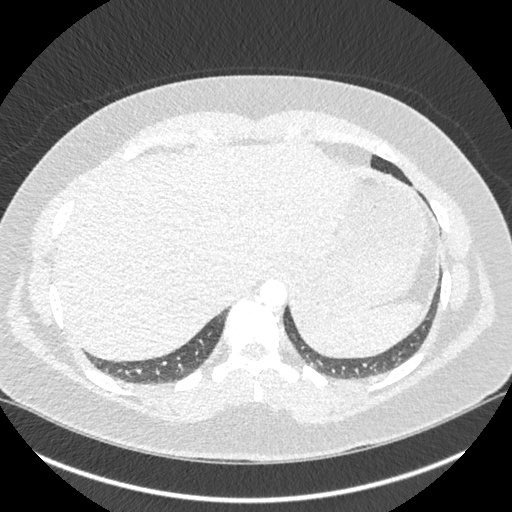
[im 97/363  mediastinal]
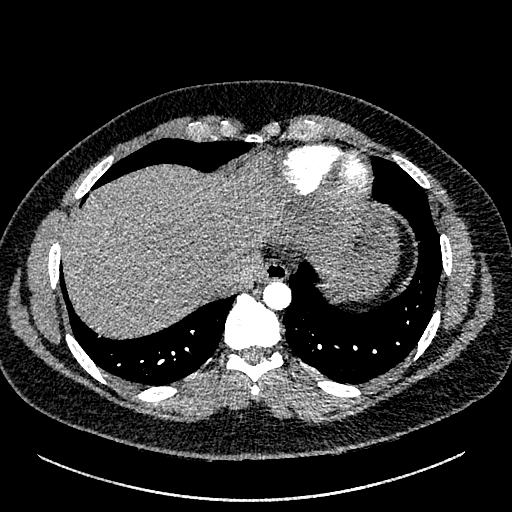
[im 121/363  lung]
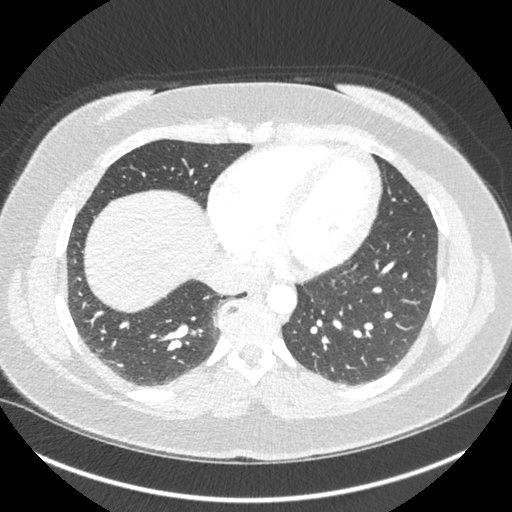
[im 145/363  mediastinal]
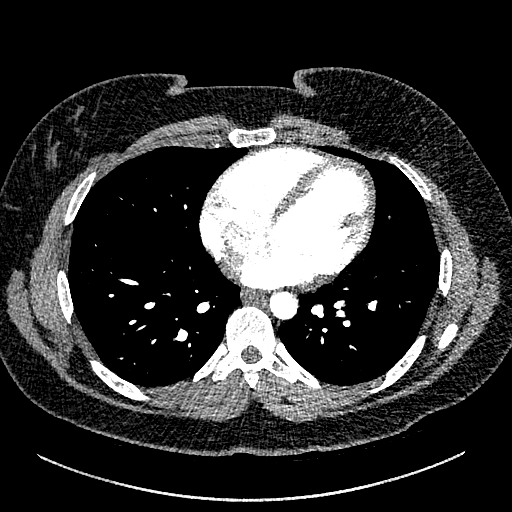
[im 169/363  lung]
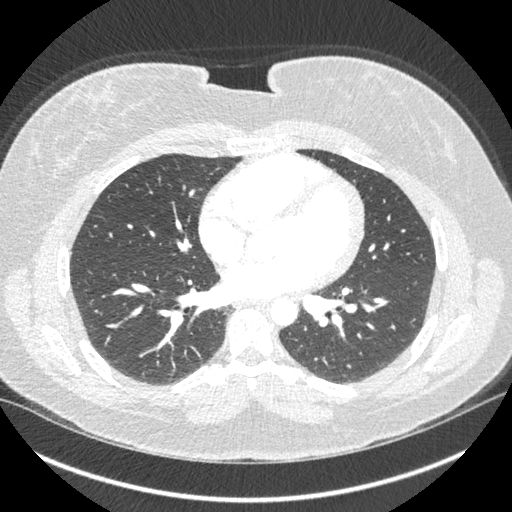
[im 194/363  mediastinal]
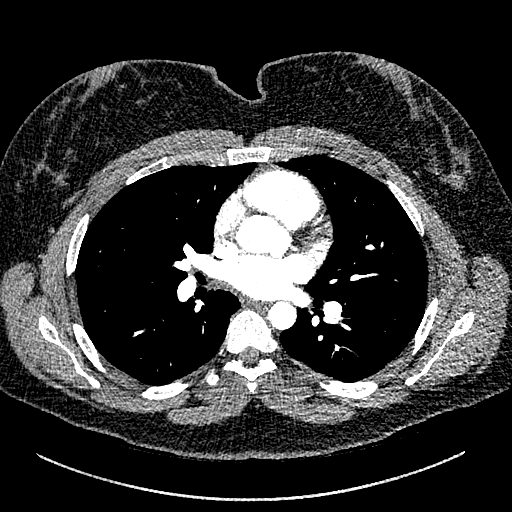
[im 218/363  lung]
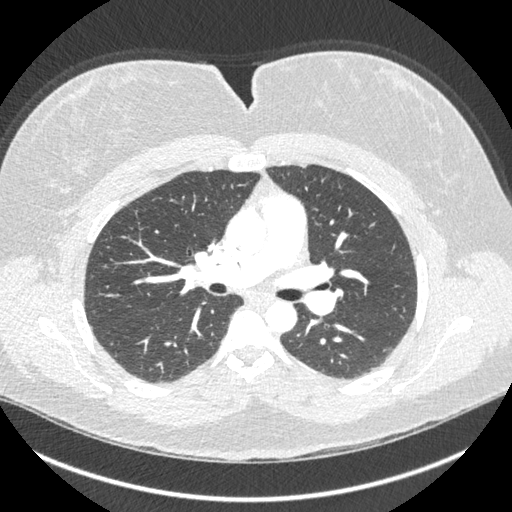
[im 242/363  mediastinal]
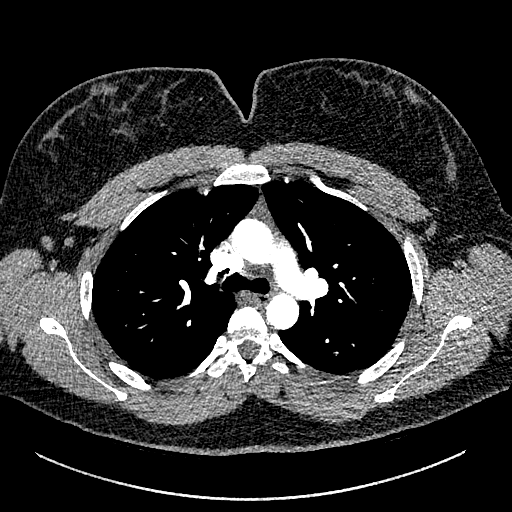
[im 266/363  lung]
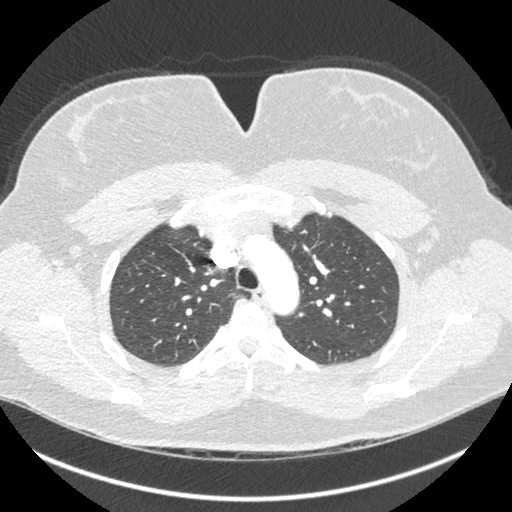
[im 290/363  mediastinal]
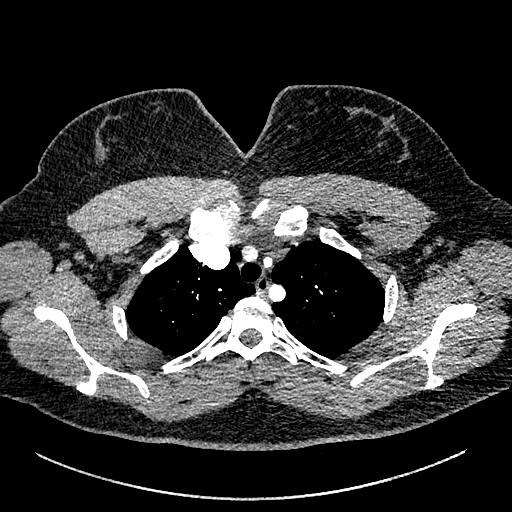
[im 314/363  lung]
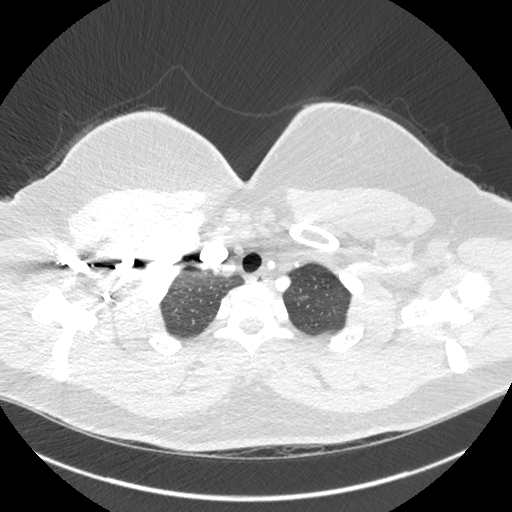
[im 338/363  mediastinal]
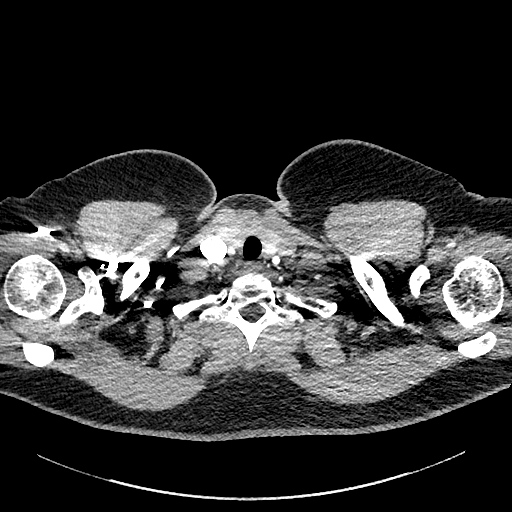

[Series 7: cta pulmonary 2.00 cor · coronal · 0.57mm/px · 1 of 146 slices shown]
[im 73/146  mediastinal]
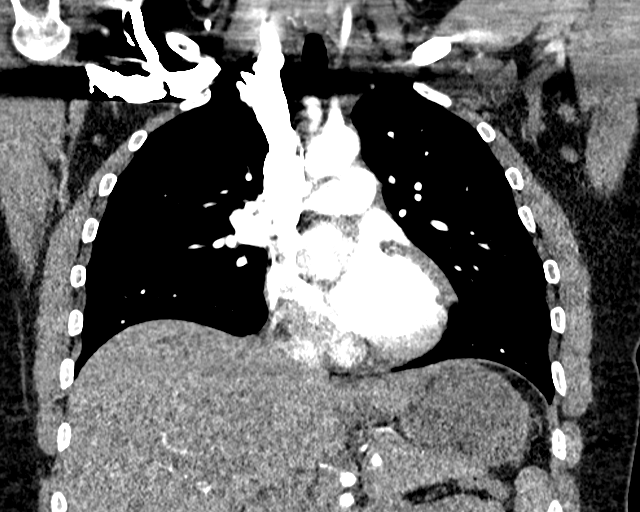

[15 of 36 positions shown; findings below may reference images not displayed]

RADIATION DOSE REDUCTION: This exam was performed according to the
departmental dose-optimization program which includes automated
exposure control, adjustment of the mA and/or kV according to
patient size and/or use of iterative reconstruction technique.

CONTRAST:  75mL OMNIPAQUE IOHEXOL 350 MG/ML SOLN
FINDINGS: Cardiovascular: Satisfactory opacification of the pulmonary arteries
to the segmental level. No evidence of pulmonary embolism. Normal
heart size. No pericardial effusion.

Mediastinum/Nodes: No enlarged mediastinal, hilar, or axillary lymph
nodes. Thyroid gland, trachea, and esophagus demonstrate no
significant findings.

Lungs/Pleura: Lungs are clear. No pleural effusion or pneumothorax.

Upper Abdomen: No acute abnormality.

Musculoskeletal: No chest wall abnormality. No acute or significant
osseous findings.

Review of the MIP images confirms the above findings.
IMPRESSION: No definite evidence of pulmonary embolus. No acute abnormality seen
in the chest.

## 2023-11-26 DIAGNOSIS — R198 Other specified symptoms and signs involving the digestive system and abdomen: Secondary | ICD-10-CM | POA: Diagnosis not present

## 2023-11-26 DIAGNOSIS — N393 Stress incontinence (female) (male): Secondary | ICD-10-CM | POA: Diagnosis not present

## 2023-11-26 DIAGNOSIS — N812 Incomplete uterovaginal prolapse: Secondary | ICD-10-CM | POA: Diagnosis not present

## 2023-12-28 ENCOUNTER — Ambulatory Visit (INDEPENDENT_AMBULATORY_CARE_PROVIDER_SITE_OTHER): Payer: Federal, State, Local not specified - PPO | Admitting: Family Medicine

## 2023-12-30 ENCOUNTER — Ambulatory Visit: Payer: Federal, State, Local not specified - PPO | Admitting: Internal Medicine

## 2023-12-30 ENCOUNTER — Ambulatory Visit: Payer: BC Managed Care – PPO | Admitting: Family Medicine

## 2023-12-30 VITALS — BP 122/78 | HR 88 | Temp 97.8°F | Ht 67.0 in | Wt 193.8 lb

## 2023-12-30 DIAGNOSIS — R109 Unspecified abdominal pain: Secondary | ICD-10-CM | POA: Diagnosis not present

## 2023-12-30 DIAGNOSIS — T148XXA Other injury of unspecified body region, initial encounter: Secondary | ICD-10-CM | POA: Diagnosis not present

## 2023-12-30 DIAGNOSIS — R1013 Epigastric pain: Secondary | ICD-10-CM

## 2023-12-30 DIAGNOSIS — R10A0 Flank pain, unspecified side: Secondary | ICD-10-CM

## 2023-12-30 LAB — COMPREHENSIVE METABOLIC PANEL
ALT: 13 U/L (ref 0–35)
AST: 14 U/L (ref 0–37)
Albumin: 4.7 g/dL (ref 3.5–5.2)
Alkaline Phosphatase: 57 U/L (ref 39–117)
BUN: 11 mg/dL (ref 6–23)
CO2: 24 meq/L (ref 19–32)
Calcium: 9.1 mg/dL (ref 8.4–10.5)
Chloride: 104 meq/L (ref 96–112)
Creatinine, Ser: 0.8 mg/dL (ref 0.40–1.20)
GFR: 96.46 mL/min (ref >60.00)
Glucose, Bld: 97 mg/dL (ref 70–99)
Potassium: 4 meq/L (ref 3.5–5.1)
Sodium: 138 meq/L (ref 135–145)
Total Bilirubin: 0.5 mg/dL (ref 0.2–1.2)
Total Protein: 7.9 g/dL (ref 6.0–8.3)

## 2023-12-30 LAB — POC URINALSYSI DIPSTICK (AUTOMATED)
Bilirubin, UA: NEGATIVE
Blood, UA: NEGATIVE
Glucose, UA: NEGATIVE
Ketones, UA: NEGATIVE
Nitrite, UA: NEGATIVE
Protein, UA: NEGATIVE
Spec Grav, UA: 1.015 (ref 1.010–1.025)
Urobilinogen, UA: 0.2 U/dL
pH, UA: 6 (ref 5.0–8.0)

## 2023-12-30 LAB — LIPASE: Lipase: 13 U/L (ref 11.0–59.0)

## 2023-12-30 LAB — POCT URINE PREGNANCY: Preg Test, Ur: NEGATIVE

## 2023-12-30 NOTE — Assessment & Plan Note (Addendum)
 Occurred yesterday.  Possibly could have been related to something eaten or a viral illness.  Improving at this time.  Checking lab work as outlined.  If it recurs she will be reevaluated.

## 2023-12-30 NOTE — Progress Notes (Signed)
 Camellia Her, MD Phone: [phone]  Michele [name] is a 33 y.o. female who presents today for same-day visit.  Flank pain: Patient notes onset a few weeks ago.  No specific injury.  Notes an aching discomfort when she is just sitting there though when she moves a certain way or takes a deep breath the area will hurt more.  No numbness or weakness in her legs.  No bowel or bladder incontinence.  No radiation to her groin.  No hematuria or dysuria.  Does report yesterday she had some epigastric discomfort and some diarrhea though this is progressively improving.  No blood in her stool.  Last menstrual cycle was within the last month.  Social History   Tobacco Use  Smoking Status Never  Smokeless Tobacco Never    Current Outpatient Medications on File Prior to Visit  Medication Sig Dispense Refill   acetaminophen (TYLENOL) 500 MG tablet Take by mouth.     Cholecalciferol 25 MCG (1000 UT) capsule Take 1,000 Units by mouth daily.     Coenzyme Q10 (COQ-10 PO) Take by mouth.     COLLAGEN PO Take by mouth.     Fluticasone  Furoate (ARNUITY ELLIPTA ) 100 MCG/ACT AEPB Inhale 1 puff into the lungs daily. 30 each 6   levalbuterol  (XOPENEX  HFA) 45 MCG/ACT inhaler Inhale 2 puffs into the lungs every 6 (six) hours as needed for wheezing. 1 each 2   Magnesium 500 MG TABS Take 1,000 mg by mouth daily.     metoprolol succinate (TOPROL-XL) 25 MG 24 hr tablet Take 25 mg by mouth daily.     Multiple Vitamin (MULTIVITAMIN ADULT PO) Take by mouth.     Omega-3 Fatty Acids (FISH OIL) 1000 MG CAPS Take 1 capsule by mouth daily.     omeprazole (PRILOSEC) 20 MG capsule Take 20 mg by mouth as needed.  1   psyllium (METAMUCIL) 58.6 % powder Take 1 packet by mouth daily.     No current facility-administered medications on file prior to visit.     ROS see history of present illness  Objective  Physical Exam Vitals:   12/30/23 1400  BP: 122/78  Pulse: 88  Temp: 97.8 F (36.6 C)  SpO2: 100%     BP Readings from Last 3 Encounters:  12/30/23 122/78  11/03/23 119/76  09/22/23 126/87   Wt Readings from Last 3 Encounters:  12/30/23 193 lb 12.8 oz (87.9 kg)  11/03/23 194 lb (88 kg)  09/22/23 195 lb (88.5 kg)    Physical Exam Constitutional:      General: She is not in acute distress.    Appearance: She is not diaphoretic.  Cardiovascular:     Rate and Rhythm: Normal rate and regular rhythm.     Heart sounds: Normal heart sounds.  Pulmonary:     Effort: Pulmonary effort is normal.     Breath sounds: Normal breath sounds.  Abdominal:     General: Bowel sounds are normal. There is no distension.     Palpations: Abdomen is soft.     Tenderness: There is abdominal tenderness (Mild epigastric).  Musculoskeletal:       Back:  Skin:    General: Skin is warm and dry.  Neurological:     Mental Status: She is alert.      Assessment/Plan: Please see individual problem list.  Muscle strain Assessment & Plan: Suspect strain of the muscles in her left flank area.  Discussed rest.  We will check lab work to look  at her pancreas and liver given the abdominal discomfort she reports from yesterday.  Discussed patient could try ibuprofen or Aleve for discomfort.   Flank pain -     POCT Urinalysis Dipstick (Automated)  Epigastric abdominal pain Assessment & Plan: Occurred yesterday.  Possibly could have been related to something eaten or a viral illness.  Improving at this time.  Checking lab work as outlined.  If it recurs she will be reevaluated.  Orders: -     Lipase -     Comprehensive metabolic panel -     POCT urine pregnancy    Return if symptoms worsen or fail to improve.   Camellia Her, MD [facility]

## 2023-12-30 NOTE — Assessment & Plan Note (Addendum)
 Suspect strain of the muscles in her left flank area.  Discussed rest.  We will check lab work to look at her pancreas and liver given the abdominal discomfort she reports from yesterday.  Discussed patient could try ibuprofen or Aleve for discomfort.

## 2024-02-01 ENCOUNTER — Ambulatory Visit (INDEPENDENT_AMBULATORY_CARE_PROVIDER_SITE_OTHER): Payer: Federal, State, Local not specified - PPO | Admitting: Family Medicine

## 2024-02-01 ENCOUNTER — Encounter (INDEPENDENT_AMBULATORY_CARE_PROVIDER_SITE_OTHER): Payer: Self-pay | Admitting: Family Medicine

## 2024-02-01 VITALS — BP 118/75 | HR 74 | Temp 97.5°F | Ht 67.0 in | Wt 197.0 lb

## 2024-02-01 DIAGNOSIS — E669 Obesity, unspecified: Secondary | ICD-10-CM

## 2024-02-01 DIAGNOSIS — E559 Vitamin D deficiency, unspecified: Secondary | ICD-10-CM | POA: Diagnosis not present

## 2024-02-01 DIAGNOSIS — Z6831 Body mass index (BMI) 31.0-31.9, adult: Secondary | ICD-10-CM

## 2024-02-01 DIAGNOSIS — E7849 Other hyperlipidemia: Secondary | ICD-10-CM | POA: Diagnosis not present

## 2024-02-01 DIAGNOSIS — Z6830 Body mass index (BMI) 30.0-30.9, adult: Secondary | ICD-10-CM

## 2024-02-01 DIAGNOSIS — E88819 Insulin resistance, unspecified: Secondary | ICD-10-CM

## 2024-02-01 NOTE — Progress Notes (Signed)
 Michele [name], D.O.  ABFM, ABOM Specializing in Clinical Bariatric Medicine  Office located at: [address] [place], [place]  [address]   Assessment and Plan:   FOR THE DISEASE OF OBESITY: BMI 31.0-31.9,adult - current BMI 30.85 Generalized obesity: Starting BMI of 34.4 Assessment & Plan: Since last office visit on 11/03/23 patient's  Muscle mass has increased by 0.4 lb. Fat mass has increased by 3 lb. Total body water has decreased by 1.2 lb.  Counseling done on how various foods will affect these numbers and how to maximize success  Total lbs lost to date: 23 lbs  Total weight loss percentage to date: 10.45%    Recommended Dietary Goals [name] is currently in the action stage of change. As such, her goal is to continue weight management plan.  She has agreed to: journal 1600-1700 calories and 120++ grams of protein daily with the CAT 3 MP with B/L options as a guide. This change was made so that pt has the freedom to make the foods she likes in a healthy manner.    Behavioral Intervention We discussed the following today: increasing green leafy vegetables, experimenting with spices and different flavors, increasing lean protein intake to established goals, work on tracking and journaling calories using tracking application, and using GPT or another AI platform for recipe ideas- searching "low calorie, low carb, high protein chicken recipes" etc  Additional resources provided today:  handout on food journaling log  Evidence-based interventions for health behavior change were utilized today including the discussion of self monitoring techniques, problem-solving barriers and SMART goal setting techniques.   Regarding patient's less desirable eating habits and patterns, we employed the technique of small changes.   Pt will specifically work on:  journaling 3-5 days a week.   Recommended Physical Activity Goals Lyndie has been advised to work up to 150 minutes of moderate  intensity aerobic activity a week and strengthening exercises 2-3 times per week for cardiovascular health, weight loss maintenance and preservation of muscle mass.   She has agreed to : Continue current level of physical activity    Pharmacotherapy We both agreed to : continue with nutritional and behavioral strategies   FOR ASSOCIATED CONDITIONS ADDRESSED TODAY:  Insulin resistance Assessment & Plan: No current meds - diet/exercise approach. In general her hunger and cravings are well controlled. Continue balanced diet focusing on protein, fruits, and vegetables while limiting simple carbohydrates.    Other hyperlipidemia Assessment & Plan: Diet/exercise approach. Her last lipid panel was obtained on 12/03/2022. Her LDL was elevated at 116. Pt advised to reduce saturated and trans fats in diet. Pt meeting with PCP in March - consider obtaining FLP, vitamin D, fasting insulin at that time. Will continue to monitor condition.    Vitamin D deficiency Assessment & Plan: Pt is doing well on OTC Cholecalciferol 1000 units daily. Continue regimen.    Follow up:   Return 03/30/2024. She was informed of the importance of frequent follow up visits to maximize her success with intensive lifestyle modifications for her multiple health conditions.  Subjective:   Chief complaint: Obesity [name] is here to discuss her progress with her obesity treatment plan. She is on Category 3 Plan with B/L options and states she is following her eating plan approximately 70% of the time. She states she is doing cardio/weights 40 minutes 6 days per week.  Interval History:  [name] is here for a follow up office visit. Since last OV on 11/03/23,  Willean is up 3 lbs. She is not surprised by her weight gain given the holiday period and her birthday. She is following the CAT 3 strictly for breakfast and lunch. She admits to being bored with her sandwich and apple at lunch. She acknowledges deviating a  bit from her meal plan during dinner. She tries to get in 6-8 ounces of protein at night but will add simple carbs such as [employer]. Her hunger is controlled.   Pharmacotherapy for weight loss: She is currently taking no anti-obesity medication.   Review of Systems:  Pertinent positives were addressed with patient today.  Reviewed by clinician on day of visit: allergies, medications, problem list, medical history, surgical history, family history, social history, and previous encounter notes.  Weight Summary and Biometrics   Weight Lost Since Last Visit: 0 lb  Weight Gained Since Last Visit: 3 lb   Vitals Temp: (!) 97.5 F (36.4 C) BP: 118/75 Pulse Rate: 74 SpO2: 99 %   Anthropometric Measurements Height: 5\' 7"  (1.702 m) Weight: 197 lb (89.4 kg) BMI (Calculated): 30.85 Weight at Last Visit: 194 lb Weight Lost Since Last Visit: 0 lb Weight Gained Since Last Visit: 3 lb Starting Weight: 220 lb Total Weight Loss (lbs): 23 lb (10.4 kg) Peak Weight: 245 lb   Body Composition  Body Fat %: 37.5 % Fat Mass (lbs): 74.2 lbs Muscle Mass (lbs): 117.2 lbs Total Body Water (lbs): 80.2 lbs Visceral Fat Rating : 7   Other Clinical Data Fasting: No Labs: No Today's Visit #: 17 Starting Date: 04/08/22   Objective:   PHYSICAL EXAM: Blood pressure 118/75, pulse 74, temperature (!) 97.5 F (36.4 C), height 5\' 7"  (1.702 m), weight 197 lb (89.4 kg), last menstrual period 01/01/2024, SpO2 99%. Body mass index is 30.85 kg/m.  General: she is overweight, cooperative and in no acute distress. PSYCH: Has normal mood, affect and thought process.   HEENT: EOMI, sclerae are anicteric. Lungs: Normal breathing effort, no conversational dyspnea. Extremities: Moves * 4 Neurologic: A and O * 3, good insight  DIAGNOSTIC DATA REVIEWED: BMET    Component Value Date/Time   NA 138 12/30/2023 1407   NA 143 04/08/2022 0956   K 4.0 12/30/2023 1407   CL 104 12/30/2023 1407   CO2 24  12/30/2023 1407   GLUCOSE 97 12/30/2023 1407   BUN 11 12/30/2023 1407   BUN 9 04/08/2022 0956   CREATININE 0.80 12/30/2023 1407   CALCIUM 9.1 12/30/2023 1407   GFRNONAA >60 05/22/2023 1705   Lab Results  Component Value Date   HGBA1C 5.2 12/04/2022   HGBA1C 5.0 08/04/2016   Lab Results  Component Value Date   INSULIN 4.4 12/04/2022   INSULIN 8.7 04/08/2022   Lab Results  Component Value Date   TSH 2.120 04/08/2022   CBC    Component Value Date/Time   WBC 7.7 08/19/2023 1510   RBC 4.20 08/19/2023 1510   HGB 13.7 08/19/2023 1510   HGB 15.4 02/08/2020 1405   HCT 41.1 08/19/2023 1510   HCT 43.7 02/08/2020 1405   PLT 281.0 08/19/2023 1510   PLT 319 02/08/2020 1405   MCV 97.8 08/19/2023 1510   MCV 94 02/08/2020 1405   MCH 32.3 05/22/2023 1705   MCHC 33.5 08/19/2023 1510   RDW 12.5 08/19/2023 1510   RDW 12.0 02/08/2020 1405   Iron Studies No results found for: "IRON", "TIBC", "FERRITIN", "IRONPCTSAT" Lipid Panel     Component Value Date/Time   CHOL 191 12/04/2022 1042  TRIG 87 12/04/2022 1042   HDL 59 12/04/2022 1042   CHOLHDL 3.2 12/04/2022 1042   CHOLHDL 4 08/28/2022 0830   VLDL 16.0 08/28/2022 0830   LDLCALC 116 (H) 12/04/2022 1042   Hepatic Function Panel     Component Value Date/Time   PROT 7.9 12/30/2023 1407   PROT 7.4 04/08/2022 0956   ALBUMIN 4.7 12/30/2023 1407   ALBUMIN 4.6 04/08/2022 0956   AST 14 12/30/2023 1407   ALT 13 12/30/2023 1407   ALKPHOS 57 12/30/2023 1407   BILITOT 0.5 12/30/2023 1407   BILITOT 0.5 04/08/2022 0956      Component Value Date/Time   TSH 2.120 04/08/2022 0956   Nutritional Lab Results  Component Value Date   VD25OH 56.4 12/04/2022   VD25OH 40.5 04/08/2022    Attestations:   I, Special Puri, acting as a Stage manager for [employer], DO., have compiled all relevant documentation for today's office visit on behalf of Thomasene Lot, DO, while in the presence of [employer], DO.  Reviewed by  clinician on day of visit: allergies, medications, problem list, medical history, surgical history, family history, social history, and previous encounter notes pertinent to patient's obesity diagnosis. I have spent 41 minutes in the care of the patient today including: preparing to see patient (e.g. review and interpretation of tests, old notes ), obtaining and/or reviewing separately obtained history, performing a medically appropriate examination or evaluation, counseling and educating the patient, ordering medications, test or procedures, documenting clinical information in the electronic or other health care record, and independently interpreting results and communicating results to the patient, family, or caregiver   I have reviewed the above documentation for accuracy and completeness, and I agree with the above. Michele [name], D.O.  The 21st Century Cures Act was signed into law in 2016 which includes the topic of electronic health records.  This provides immediate access to information in MyChart.  This includes consultation notes, operative notes, office notes, lab results and pathology reports.  If you have any questions about what you read please let us know at your next visit so we can discuss your concerns and take corrective action if need be.  We are right here with you.

## 2024-02-25 DIAGNOSIS — I429 Cardiomyopathy, unspecified: Secondary | ICD-10-CM | POA: Diagnosis not present

## 2024-02-25 DIAGNOSIS — I493 Ventricular premature depolarization: Secondary | ICD-10-CM | POA: Diagnosis not present

## 2024-02-29 ENCOUNTER — Encounter: Payer: Self-pay | Admitting: Internal Medicine

## 2024-02-29 ENCOUNTER — Ambulatory Visit: Payer: Federal, State, Local not specified - PPO | Admitting: Internal Medicine

## 2024-02-29 VITALS — BP 122/78 | HR 75 | Temp 98.2°F | Ht 67.0 in | Wt 201.8 lb

## 2024-02-29 DIAGNOSIS — Z713 Dietary counseling and surveillance: Secondary | ICD-10-CM

## 2024-02-29 DIAGNOSIS — O903 Peripartum cardiomyopathy: Secondary | ICD-10-CM

## 2024-02-29 DIAGNOSIS — M25511 Pain in right shoulder: Secondary | ICD-10-CM

## 2024-02-29 DIAGNOSIS — E7800 Pure hypercholesterolemia, unspecified: Secondary | ICD-10-CM | POA: Diagnosis not present

## 2024-02-29 DIAGNOSIS — J45998 Other asthma: Secondary | ICD-10-CM

## 2024-02-29 DIAGNOSIS — E041 Nontoxic single thyroid nodule: Secondary | ICD-10-CM | POA: Diagnosis not present

## 2024-02-29 NOTE — Progress Notes (Signed)
 Subjective:    Patient ID: Michele [name], female    DOB: [birth year], 33 y.o.   MRN: [id number]  Patient here for  Chief Complaint  Patient presents with   Medical Management of Chronic Issues   Extremity Weakness    HPI Here for a scheduled follow up - follow up regarding palpitations, hypercholesterolemia and postpartum cardiomyopathy. Saw cardiology 02/25/24. Stable. Recommended trial off schedule toprol XL. Has the medication to take if needed. Doing well from a cardiac standpoint. Being followed by healthy weight and wellness and has done well with weight loss. Continue diet and exercise. Works out daily.  Has had some issues with her right arm/shoulder. Aching shoulder. Arm felt weaker. No acute neurological changes or deficits noticed. Working with therapy - dry needling right posterior shoulder. Tightens if looks to the left.    Past Medical History:  Diagnosis Date   Arrhythmia    Arthritis    Asthma    Chest pain    CHF (congestive heart failure) (HCC)    GERD (gastroesophageal reflux disease)    History of chicken pox    Hx of blood clots    Hx of migraines    Hx: UTI (urinary tract infection)    Hypercholesteremia    Hyperlipidemia    Low back pain    Osteoarthritis    Palpitations    Postpartum cardiomyopathy    Sleep apnea    Thyroid nodule    Past Surgical History:  Procedure Laterality Date   ABLATION     TONSILLECTOMY AND ADENOIDECTOMY  12/23/1995   Family History  Problem Relation Age of Onset   Hypertension Mother    Diabetes Mother    High Cholesterol Mother    Hyperlipidemia Father    Hypertension Father    Breast cancer Maternal Grandmother 32   Other Maternal Grandmother        plasmacytoma of eye dx 45   Prostate cancer Maternal Grandfather    Heart disease Maternal Grandfather    Hypertension Maternal Grandfather    Diabetes Maternal Grandfather    Lymphoma Maternal Grandfather    Breast cancer Paternal Grandmother     Hyperlipidemia Paternal Grandmother    Hypertension Paternal Grandmother    Diabetes Paternal Grandmother    Mental illness Paternal Grandmother    Hyperlipidemia Paternal Grandfather    Stroke Paternal Grandfather    Hypertension Paternal Grandfather    Prostate cancer Paternal Grandfather    Breast cancer Other        mat great aunt + pat great aunt   Social History   Socioeconomic History   Marital status: Married    Spouse name: Ronaldo Miyamoto   Number of children: 0   Years of education: Not on file   Highest education level: Bachelor's degree (e.g., BA, AB, BS)  Occupational History   Occupation: PTA  Tobacco Use   Smoking status: Never   Smokeless tobacco: Never  Vaping Use   Vaping status: Never Used  Substance and Sexual Activity   Alcohol use: Yes    Alcohol/week: 0.0 standard drinks of alcohol   Drug use: No   Sexual activity: Yes  Other Topics Concern   Not on file  Social History Narrative   PT assistant at assisted living; lives in [place]; 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    Social Drivers of Corporate investment banker Strain: Low Risk  (02/18/2024)   [facility]   Overall Financial Resource Strain (  CARDIA)    Difficulty of Paying Living Expenses: Not very hard  Food Insecurity: No Food Insecurity (02/18/2024)   Received from [facility]   Hunger Vital Sign    Worried About Running Out of Food in the Last Year: Never true    Ran Out of Food in the Last Year: Never true  Transportation Needs: No Transportation Needs (02/18/2024)   Received from [facility] - Transportation    Lack of Transportation (Medical): No    Lack of Transportation (Non-Medical): No  Physical Activity: Sufficiently Active (12/30/2023)   Exercise Vital Sign    Days of Exercise per Week: 5 days    Minutes of Exercise per Session: 40 min  Stress: No Stress Concern Present (12/30/2023)   [employer] of Occupational Health - Occupational  Stress Questionnaire    Feeling of Stress : Only a little  Social Connections: Moderately Integrated (12/30/2023)   Social Connection and Isolation Panel [NHANES]    Frequency of Communication with Friends and Family: Three times a week    Frequency of Social Gatherings with Friends and Family: Three times a week    Attends Religious Services: 1 to 4 times per year    Active Member of Clubs or Organizations: No    Attends Engineer, structural: Not on file    Marital Status: Married     Review of Systems  Constitutional:  Negative for appetite change and unexpected weight change.  HENT:  Negative for congestion and sinus pressure.   Respiratory:  Negative for cough, chest tightness and shortness of breath.   Cardiovascular:  Negative for chest pain, palpitations and leg swelling.  Gastrointestinal:  Negative for abdominal pain, diarrhea, nausea and vomiting.  Genitourinary:  Negative for difficulty urinating and dysuria.  Musculoskeletal:  Negative for joint swelling and myalgias.       Shoulder/arm (right) aching as outlined.   Skin:  Negative for color change and rash.  Neurological:  Negative for dizziness and headaches.  Psychiatric/Behavioral:  Negative for agitation and dysphoric mood.        Objective:     BP 122/78   Pulse 75   Temp 98.2 F (36.8 C) (Oral)   Ht 5\' 7"  (1.702 m)   Wt 201 lb 12.8 oz (91.5 kg)   LMP 01/01/2024   SpO2 97%   BMI 31.61 kg/m  Wt Readings from Last 3 Encounters:  02/29/24 201 lb 12.8 oz (91.5 kg)  02/01/24 197 lb (89.4 kg)  12/30/23 193 lb 12.8 oz (87.9 kg)    Physical Exam Vitals reviewed.  Constitutional:      General: She is not in acute distress.    Appearance: Normal appearance.  HENT:     Head: Normocephalic and atraumatic.     Right Ear: External ear normal.     Left Ear: External ear normal.     Mouth/Throat:     Pharynx: No oropharyngeal exudate or posterior oropharyngeal erythema.  Eyes:     General: No  scleral icterus.       Right eye: No discharge.        Left eye: No discharge.     Conjunctiva/sclera: Conjunctivae normal.  Neck:     Thyroid: No thyromegaly.  Cardiovascular:     Rate and Rhythm: Normal rate and regular rhythm.  Pulmonary:     Effort: No respiratory distress.     Breath sounds: Normal breath sounds. No wheezing.  Abdominal:  General: Bowel sounds are normal.     Palpations: Abdomen is soft.     Tenderness: There is no abdominal tenderness.  Musculoskeletal:        General: No swelling.     Cervical back: Neck supple. No tenderness.     Comments: Some increased pulling - with rotating head to the left - (felt in posterior shoulder). No arm weakness. No focal neurological deficits.   Lymphadenopathy:     Cervical: No cervical adenopathy.  Skin:    Findings: No erythema or rash.  Neurological:     Mental Status: She is alert.  Psychiatric:        Mood and Affect: Mood normal.        Behavior: Behavior normal.         Outpatient Encounter Medications as of 02/29/2024  Medication Sig   acetaminophen (TYLENOL) 500 MG tablet Take by mouth.   Cholecalciferol 25 MCG (1000 UT) capsule Take 1,000 Units by mouth daily.   Coenzyme Q10 (COQ-10 PO) Take by mouth.   COLLAGEN PO Take by mouth.   Magnesium 500 MG TABS Take 1,000 mg by mouth daily.   metoprolol succinate (TOPROL-XL) 25 MG 24 hr tablet Take 25 mg by mouth daily as needed.   Misc Natural Products (PUMPKIN SEED OIL) CAPS    Multiple Vitamin (MULTIVITAMIN ADULT PO) Take by mouth.   Omega-3 Fatty Acids (FISH OIL) 1000 MG CAPS Take 1 capsule by mouth daily.   omeprazole (PRILOSEC) 20 MG capsule Take 20 mg by mouth as needed.   psyllium (METAMUCIL) 58.6 % powder Take 1 packet by mouth daily.   levalbuterol (XOPENEX HFA) 45 MCG/ACT inhaler Inhale 2 puffs into the lungs every 6 (six) hours as needed for wheezing.   [DISCONTINUED] Fluticasone Furoate (ARNUITY ELLIPTA) 100 MCG/ACT AEPB Inhale 1 puff into the  lungs daily. (Patient not taking: Reported on 02/29/2024)   No facility-administered encounter medications on file as of 02/29/2024.     Lab Results  Component Value Date   WBC 7.7 08/19/2023   HGB 13.7 08/19/2023   HCT 41.1 08/19/2023   PLT 281.0 08/19/2023   GLUCOSE 97 12/30/2023   CHOL 191 12/04/2022   TRIG 87 12/04/2022   HDL 59 12/04/2022   LDLCALC 116 (H) 12/04/2022   ALT 13 12/30/2023   AST 14 12/30/2023   NA 138 12/30/2023   K 4.0 12/30/2023   CL 104 12/30/2023   CREATININE 0.80 12/30/2023   BUN 11 12/30/2023   CO2 24 12/30/2023   TSH 2.120 04/08/2022   HGBA1C 5.2 12/04/2022    US Soft Tissue Head/Neck (NON-THYROID) Result Date: 09/15/2023 CLINICAL DATA:  Palpable abnormality involving the posterior aspect of the right neck for the past month. EXAM: ULTRASOUND OF HEAD/NECK SOFT TISSUES TECHNIQUE: Ultrasound examination of the head and neck soft tissues was performed in the area of clinical concern. COMPARISON:  None Available. FINDINGS: Sonographic evaluation of the patient's palpable area of concern involving the posterior aspect of the right neck correlates with a well-defined hypoechoic nodule favored to represent a subcutaneous lymph node. The suspected lymph node is not enlarged by size criteria measuring 0.2 cm in greatest short axis diameter (image 3) Sonographic evaluation of the patient's palpable area of concern beneath the right ear correlates with a mildly prominent though benign-appearing right cervical lymph node. The lymph node is not enlarged by size criteria measuring 0.4 cm in greatest short axis diameter and maintains a portion of its benign fatty hilum (image 9).  IMPRESSION: 1. Sonographic evaluation of the patient's palpable area of concern involving the posterior aspect of the right neck correlates with a well-defined hypoechoic nodule favored to represent a subcutaneous lymph node. 2. Sonographic evaluation of the patient's palpable area of concern beneath  the right ear correlates with a benign-appearing non pathologically enlarged right cervical lymph node presumably reactive in etiology. Electronically Signed   By: Simonne Come M.D.   On: 09/15/2023 08:44       Assessment & Plan:  Thyroid nodule Assessment & Plan: Had follow-up with Dr. Gershon Crane 11/27/2022.  Follow-up thyroid nodule.  Ultrasound stable.  Recommended follow-up ultrasound in 2 years.   Seasonal asthma Assessment & Plan: Has seen Dr. Jayme Cloud previously.  Breathing stable.   Postpartum cardiomyopathy Assessment & Plan: Had follow-up with cardiology 02/25/2024.  Stable.  Recommended trial off scheduled Toprol XL.  She has the metoprolol to take if needed.  Stable from a cardiac standpoint.   Hypercholesterolemia Assessment & Plan: Low-cholesterol diet and exercise.  Follow lipid panel.   Right shoulder pain, unspecified chronicity Assessment & Plan: Shoulder/arm pain as outlined.  Described the pain and previous perceived weakness as outlined.  No focal neurological deficit noted.  Some tightness and pulling sensation in her posterior neck and shoulder with rotation of her head to the left.  Therapy.  Status post dry needling.  Discussed further workup.  Notify me if desires any further intervention.   Weight loss counseling, encounter for Assessment & Plan: Is being followed by the healthy weight and wellness program.  Has done well with diet and exercise.  Working out daily.  Continue diet and exercise.  Follow.      Dale Naples, MD

## 2024-03-06 ENCOUNTER — Encounter: Payer: Self-pay | Admitting: Internal Medicine

## 2024-03-06 DIAGNOSIS — M25511 Pain in right shoulder: Secondary | ICD-10-CM | POA: Insufficient documentation

## 2024-03-06 NOTE — Assessment & Plan Note (Signed)
 Has seen Dr. Jayme Cloud previously.  Breathing stable.

## 2024-03-06 NOTE — Assessment & Plan Note (Signed)
 Shoulder/arm pain as outlined.  Described the pain and previous perceived weakness as outlined.  No focal neurological deficit noted.  Some tightness and pulling sensation in her posterior neck and shoulder with rotation of her head to the left.  Therapy.  Status post dry needling.  Discussed further workup.  Notify me if desires any further intervention.

## 2024-03-06 NOTE — Assessment & Plan Note (Signed)
 Low cholesterol diet and exercise.  Follow lipid panel.

## 2024-03-06 NOTE — Assessment & Plan Note (Signed)
 Had follow-up with Dr. Gershon Crane 11/27/2022.  Follow-up thyroid nodule.  Ultrasound stable.  Recommended follow-up ultrasound in 2 years.

## 2024-03-06 NOTE — Assessment & Plan Note (Signed)
 Is being followed by the healthy weight and wellness program.  Has done well with diet and exercise.  Working out daily.  Continue diet and exercise.  Follow.

## 2024-03-06 NOTE — Assessment & Plan Note (Signed)
 Had follow-up with cardiology 02/25/2024.  Stable.  Recommended trial off scheduled Toprol XL.  She has the metoprolol to take if needed.  Stable from a cardiac standpoint.

## 2024-03-15 ENCOUNTER — Ambulatory Visit (INDEPENDENT_AMBULATORY_CARE_PROVIDER_SITE_OTHER): Payer: Federal, State, Local not specified - PPO | Admitting: Family Medicine

## 2024-03-16 DIAGNOSIS — H53149 Visual discomfort, unspecified: Secondary | ICD-10-CM | POA: Diagnosis not present

## 2024-03-16 DIAGNOSIS — H5213 Myopia, bilateral: Secondary | ICD-10-CM | POA: Diagnosis not present

## 2024-03-16 DIAGNOSIS — H52223 Regular astigmatism, bilateral: Secondary | ICD-10-CM | POA: Diagnosis not present

## 2024-03-16 DIAGNOSIS — H5319 Other subjective visual disturbances: Secondary | ICD-10-CM | POA: Diagnosis not present

## 2024-03-30 ENCOUNTER — Encounter (INDEPENDENT_AMBULATORY_CARE_PROVIDER_SITE_OTHER): Payer: Self-pay | Admitting: Family Medicine

## 2024-03-30 ENCOUNTER — Ambulatory Visit (INDEPENDENT_AMBULATORY_CARE_PROVIDER_SITE_OTHER): Payer: Federal, State, Local not specified - PPO | Admitting: Family Medicine

## 2024-03-30 VITALS — BP 115/71 | HR 69 | Temp 97.9°F | Ht 67.0 in | Wt 197.0 lb

## 2024-03-30 DIAGNOSIS — E559 Vitamin D deficiency, unspecified: Secondary | ICD-10-CM

## 2024-03-30 DIAGNOSIS — E88819 Insulin resistance, unspecified: Secondary | ICD-10-CM

## 2024-03-30 DIAGNOSIS — Z6831 Body mass index (BMI) 31.0-31.9, adult: Secondary | ICD-10-CM

## 2024-03-30 DIAGNOSIS — E7849 Other hyperlipidemia: Secondary | ICD-10-CM | POA: Diagnosis not present

## 2024-03-30 DIAGNOSIS — Z6830 Body mass index (BMI) 30.0-30.9, adult: Secondary | ICD-10-CM

## 2024-03-30 DIAGNOSIS — E669 Obesity, unspecified: Secondary | ICD-10-CM

## 2024-03-30 NOTE — Progress Notes (Signed)
 [name], D.O.  ABFM, ABOM Specializing in Clinical Bariatric Medicine  Office located at: [address] [place], [place]  [address]   Assessment and Plan:   Orders Placed This Encounter  Procedures   Hemoglobin A1c   Insulin, random   VITAMIN D 25 Hydroxy (Vit-D Deficiency, Fractures)   TSH   T4, free   Lipid panel   Medications Discontinued During This Encounter  Medication Reason   metoprolol succinate (TOPROL-XL) 25 MG 24 hr tablet Patient Preference    No orders of the defined types were placed in this encounter.   Will review labs obtained today (A1c, insulin, Vit D, TSH, T4, and lipid) at next OV.  FOR THE DISEASE OF OBESITY:  BMI 31.0-31.9,adult - current BMI 30.85 Generalized obesity: Starting BMI of 34.4 Assessment & Plan: Since last office visit on 02/01/2024, patient's  Muscle mass has decreased by 0.4 lbs. Fat mass has decreased by 0.2 lbs. Total body water has increased by 0.2 lbs.  Counseling done on how various foods will affect these numbers and how to maximize success  Total lbs lost to date: 23 lbs Total weight loss percentage to date: 10.45%    Recommended Dietary Goals Swayze is currently in the action stage of change. As such, her goal is to continue weight management plan.  She has agreed to: continue current plan   Behavioral Intervention We discussed the following today: MyNetDiary for food journaling, reading food labels , going to the skinnytaste website for recipe ideas, and using GPT or another AI platform for recipe ideas- searching "low calorie, low carb, high protein chicken recipes" etc  Additional resources provided today: Handout on Common Characteristics of Successful Weight Losers,  Evidence-based interventions for health behavior change were utilized today including the discussion of self monitoring techniques, problem-solving barriers and SMART goal setting techniques.   Regarding patient's less desirable eating  habits and patterns, we employed the technique of small changes.   Pt will specifically work on: Journaling 3 days per week, and importantly days off plan for next visit.    Recommended Physical Activity Goals Lilith has been advised to work up to 150 minutes of moderate intensity aerobic activity a week and strengthening exercises 2-3 times per week for cardiovascular health, weight loss maintenance and preservation of muscle mass.   She has agreed to :  Continue current level of physical activity    Pharmacotherapy We both agreed to : continue with nutritional and behavioral strategies   FOR ASSOCIATED CONDITIONS ADDRESSED TODAY:  Insulin resistance Assessment & Plan: Yamilee is not taking any medications for this condition. Diet/exercise approach. Hunger/cravings well controlled. Continue prioritizing lean protein in diet, and avoid simple carbs. Continue engaging in regular exercise. Will recheck A1c and fasting insulin today.   Relevant Orders: -     Hemoglobin A1c -     Insulin, random -     TSH -     T4, free   Vitamin D deficiency Assessment & Plan: Braelynne is on Cholecalciferol 1000 units daily. Tolerating well, no concerns with this supplementation. Continue current supplement. Will recheck today.   Relevant Orders: -     VITAMIN D 25 Hydroxy (Vit-D Deficiency, Fractures)   Other hyperlipidemia Assessment & Plan: Chairty is not taking any cholesterol medication. She endorses working out more and focusing mainly on chicken. Encouraged pt to continue prioritizing lean proteins and foods that are low in saturated/trans fats. Will recheck levels today.  Relevant Orders: -  Lipid panel  Follow up:   Return in about 11 weeks (around 06/15/2024). She was informed of the importance of frequent follow up visits to maximize her success with intensive lifestyle modifications for her multiple health conditions.  Dany Makhiya Coburn is aware that we will review all of her lab  results at our next visit together in person.  She is aware that if anything is critical/ life threatening with the results, we will be contacting her via MyChart or by my CMA will be calling them prior to the office visit to discuss acute management.    Subjective:   Chief complaint: Obesity Hind is here to discuss her progress with her obesity treatment plan. She is keeping a food journal of 1600-1700 calories and 120++ grams of protein daily with the CAT 3 MP with B/L options as a guide  and states she is following her eating plan approximately 65% of the time. She states she is doing cardio and weights 40 minutes 4-5 days per week.  Interval History:  Ravleen Ries is here for a follow up office visit. Since last OV on 02/01/2024, pt's weight has not changed. Lafaye started journaling and was consistent for about a week then stopped and never restarted. When she does journal, she finds herself not journaling everything if she does not find it significant. Pt endorses getting tired of eating certain meals because they are "bland". Additionally, pt has been doing exercise plans, such as 21 day fix with [facility], to maintain physical activity.   Pharmacotherapy for weight loss: She is currently taking no anti-obesity medication.   Review of Systems:  Pertinent positives were addressed with patient today.  Reviewed by clinician on day of visit: allergies, medications, problem list, medical history, surgical history, family history, social history, and previous encounter notes.  Weight Summary and Biometrics   Weight Lost Since Last Visit: 0  Weight Gained Since Last Visit: 0   Vitals Temp: 97.9 F (36.6 C) BP: 115/71 Pulse Rate: 69 SpO2: 99 %   Anthropometric Measurements Height: 5\' 7"  (1.702 m) Weight: 197 lb (89.4 kg) BMI (Calculated): 30.85 Weight at Last Visit: 197 lb Weight Lost Since Last Visit: 0 Weight Gained Since Last Visit: 0 Starting Weight: 220 lb Total  Weight Loss (lbs): 23 lb (10.4 kg) Peak Weight: 245 lb   Body Composition  Body Fat %: 37.6 % Fat Mass (lbs): 74 lbs Muscle Mass (lbs): 116.8 lbs Total Body Water (lbs): 80.4 lbs Visceral Fat Rating : 7   Other Clinical Data Fasting: Yes Labs: Yes Today's Visit #: 18 Starting Date: 04/08/22    Objective:   PHYSICAL EXAM: Blood pressure 115/71, pulse 69, temperature 97.9 F (36.6 C), height 5\' 7"  (1.702 m), weight 197 lb (89.4 kg), SpO2 99%. Body mass index is 30.85 kg/m.  General: she is overweight, cooperative and in no acute distress. PSYCH: Has normal mood, affect and thought process.   HEENT: EOMI, sclerae are anicteric. Lungs: Normal breathing effort, no conversational dyspnea. Extremities: Moves * 4 Neurologic: A and O * 3, good insight  DIAGNOSTIC DATA REVIEWED: BMET    Component Value Date/Time   NA 138 12/30/2023 1407   NA 143 04/08/2022 0956   K 4.0 12/30/2023 1407   CL 104 12/30/2023 1407   CO2 24 12/30/2023 1407   GLUCOSE 97 12/30/2023 1407   BUN 11 12/30/2023 1407   BUN 9 04/08/2022 0956   CREATININE 0.80 12/30/2023 1407   CALCIUM 9.1 12/30/2023 1407  GFRNONAA >60 05/22/2023 1705   Lab Results  Component Value Date   HGBA1C 5.2 12/04/2022   HGBA1C 5.0 08/04/2016   Lab Results  Component Value Date   INSULIN 4.4 12/04/2022   INSULIN 8.7 04/08/2022   Lab Results  Component Value Date   TSH 2.120 04/08/2022   CBC    Component Value Date/Time   WBC 7.7 08/19/2023 1510   RBC 4.20 08/19/2023 1510   HGB 13.7 08/19/2023 1510   HGB 15.4 02/08/2020 1405   HCT 41.1 08/19/2023 1510   HCT 43.7 02/08/2020 1405   PLT 281.0 08/19/2023 1510   PLT 319 02/08/2020 1405   MCV 97.8 08/19/2023 1510   MCV 94 02/08/2020 1405   MCH 32.3 05/22/2023 1705   MCHC 33.5 08/19/2023 1510   RDW 12.5 08/19/2023 1510   RDW 12.0 02/08/2020 1405   Iron Studies No results found for: "IRON", "TIBC", "FERRITIN", "IRONPCTSAT" Lipid Panel     Component Value  Date/Time   CHOL 191 12/04/2022 1042   TRIG 87 12/04/2022 1042   HDL 59 12/04/2022 1042   CHOLHDL 3.2 12/04/2022 1042   CHOLHDL 4 08/28/2022 0830   VLDL 16.0 08/28/2022 0830   LDLCALC 116 (H) 12/04/2022 1042   Hepatic Function Panel     Component Value Date/Time   PROT 7.9 12/30/2023 1407   PROT 7.4 04/08/2022 0956   ALBUMIN 4.7 12/30/2023 1407   ALBUMIN 4.6 04/08/2022 0956   AST 14 12/30/2023 1407   ALT 13 12/30/2023 1407   ALKPHOS 57 12/30/2023 1407   BILITOT 0.5 12/30/2023 1407   BILITOT 0.5 04/08/2022 0956      Component Value Date/Time   TSH 2.120 04/08/2022 0956   Nutritional Lab Results  Component Value Date   VD25OH 56.4 12/04/2022   VD25OH 40.5 04/08/2022    Attestations:   I, Camryn Mix, acting as a Stage manager for [employer], DO., have compiled all relevant documentation for today's office visit on behalf of Thomasene Lot, DO, while in the presence of [employer], DO.  I have reviewed the above documentation for accuracy and completeness, and I agree with the above. [name], D.O.  The 21st Century Cures Act was signed into law in 2016 which includes the topic of electronic health records.  This provides immediate access to information in MyChart.  This includes consultation notes, operative notes, office notes, lab results and pathology reports.  If you have any questions about what you read please let us know at your next visit so we can discuss your concerns and take corrective action if need be.  We are right here with you.

## 2024-03-31 LAB — LIPID PANEL

## 2024-04-01 DIAGNOSIS — D485 Neoplasm of uncertain behavior of skin: Secondary | ICD-10-CM | POA: Diagnosis not present

## 2024-04-01 DIAGNOSIS — L720 Epidermal cyst: Secondary | ICD-10-CM | POA: Diagnosis not present

## 2024-04-01 LAB — TSH: TSH: 1.78 u[IU]/mL (ref 0.450–4.500)

## 2024-04-01 LAB — VITAMIN D 25 HYDROXY (VIT D DEFICIENCY, FRACTURES): Vit D, 25-Hydroxy: 52.3 ng/mL (ref 30.0–100.0)

## 2024-04-01 LAB — T4, FREE: Free T4: 1.28 ng/dL (ref 0.82–1.77)

## 2024-04-01 LAB — LIPID PANEL
Cholesterol, Total: 215 mg/dL — ABNORMAL HIGH (ref 100–199)
HDL: 63 mg/dL (ref >39)
LDL CALC COMMENT:: 3.4 ratio (ref 0.0–4.4)
LDL Chol Calc (NIH): 139 mg/dL — ABNORMAL HIGH (ref 0–99)
Triglycerides: 74 mg/dL (ref 0–149)
VLDL Cholesterol Cal: 13 mg/dL (ref 5–40)

## 2024-04-01 LAB — INSULIN, RANDOM: INSULIN: 7.8 u[IU]/mL (ref 2.6–24.9)

## 2024-04-01 LAB — HEMOGLOBIN A1C
Est. average glucose Bld gHb Est-mCnc: 97 mg/dL
Hgb A1c MFr Bld: 5 % (ref 4.8–5.6)

## 2024-04-08 DIAGNOSIS — N393 Stress incontinence (female) (male): Secondary | ICD-10-CM | POA: Diagnosis not present

## 2024-04-08 DIAGNOSIS — N812 Incomplete uterovaginal prolapse: Secondary | ICD-10-CM | POA: Diagnosis not present

## 2024-05-20 DIAGNOSIS — R1314 Dysphagia, pharyngoesophageal phase: Secondary | ICD-10-CM | POA: Diagnosis not present

## 2024-05-20 DIAGNOSIS — H6981 Other specified disorders of Eustachian tube, right ear: Secondary | ICD-10-CM | POA: Diagnosis not present

## 2024-06-03 ENCOUNTER — Ambulatory Visit: Admitting: Internal Medicine

## 2024-06-03 ENCOUNTER — Encounter: Payer: Self-pay | Admitting: Internal Medicine

## 2024-06-03 VITALS — BP 124/74 | HR 80 | Temp 98.2°F | Resp 16 | Ht 67.0 in | Wt 207.4 lb

## 2024-06-03 DIAGNOSIS — E041 Nontoxic single thyroid nodule: Secondary | ICD-10-CM

## 2024-06-03 DIAGNOSIS — R221 Localized swelling, mass and lump, neck: Secondary | ICD-10-CM | POA: Diagnosis not present

## 2024-06-03 DIAGNOSIS — R030 Elevated blood-pressure reading, without diagnosis of hypertension: Secondary | ICD-10-CM | POA: Diagnosis not present

## 2024-06-03 NOTE — Progress Notes (Unsigned)
 Subjective:    Patient ID: Michele [name], female    DOB: Feb 15, 1991, 33 y.o.   MRN: [id number]  Patient here for  Chief Complaint  Patient presents with   Hypertension   lump in throat    HPI Here for an acute visit. Work in with concern regarding varying blood pressure.  Her blood pressure was noted to be elevated at ENT.  He has been monitoring.  The blood pressures leveled out.  Recent checks 104-120s /70s.  Saw cardiology 06/10/23 - f/u. Reported palpitations and PVC noted on her home Alive Cor device. Zio monitor placed.  Recently evaluated 04/08/24 - Dr Twylla Galen - stage 2 prolapse. Planning for robotic supracervical hysterectomy.  She does report that*in about a month ago, she feeling like something is in her throat when she swallows.  She did see ENT.  Had laryngoscopy.  No acid reflux.  No other abnormality noted.  She will not notice the symptoms first thing in the morning.  Unable to feel in the morning.  She will notice a lump after eating breakfast.  It does appear that eating can aggravate it.  Also some position changes of leaning her head forward will aggravate.  States it feels like something is pushing and onto her esophagus.  No acid reflux.  No regurgitation of food.  She did have her thyroid  evaluated.  Had thyroid  ultrasound recently.  No abdominal pain.  No bowel change.  Denies possibility of being pregnant.   Past Medical History:  Diagnosis Date   Arrhythmia    Arthritis    Asthma    Chest pain    CHF (congestive heart failure) (HCC)    GERD (gastroesophageal reflux disease)    History of chicken pox    Hx of blood clots    Hx of migraines    Hx: UTI (urinary tract infection)    Hypercholesteremia    Hyperlipidemia    Low back pain    Osteoarthritis    Palpitations    Postpartum cardiomyopathy    Sleep apnea    Thyroid  nodule    Past Surgical History:  Procedure Laterality Date   ABLATION     TONSILLECTOMY AND ADENOIDECTOMY  12/23/1995   Family  History  Problem Relation Age of Onset   Hypertension Mother    Diabetes Mother    High Cholesterol Mother    Hyperlipidemia Father    Hypertension Father    Breast cancer Maternal Grandmother 61   Other Maternal Grandmother        plasmacytoma of eye dx 45   Prostate cancer Maternal Grandfather    Heart disease Maternal Grandfather    Hypertension Maternal Grandfather    Diabetes Maternal Grandfather    Lymphoma Maternal Grandfather    Breast cancer Paternal Grandmother    Hyperlipidemia Paternal Grandmother    Hypertension Paternal Grandmother    Diabetes Paternal Grandmother    Mental illness Paternal Grandmother    Hyperlipidemia Paternal Grandfather    Stroke Paternal Grandfather    Hypertension Paternal Grandfather    Prostate cancer Paternal Grandfather    Breast cancer Other        mat great aunt + pat great aunt   Social History   Socioeconomic History   Marital status: Married    Spouse name: Jenine Mix   Number of children: 0   Years of education: Not on file   Highest education level: Bachelor's degree (e.g., BA, AB, BS)  Occupational History   Occupation: PTA  Tobacco Use   Smoking status: Never   Smokeless tobacco: Never  Vaping Use   Vaping status: Never Used  Substance and Sexual Activity   Alcohol use: Yes    Alcohol/week: 0.0 standard drinks of alcohol   Drug use: No   Sexual activity: Yes  Other Topics Concern   Not on file  Social History Narrative   PT assistant at assisted living; lives in [place]; 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    Social Drivers of Corporate investment banker Strain: Low Risk  (06/02/2024)   Overall Financial Resource Strain (CARDIA)    Difficulty of Paying Living Expenses: Not very hard  Food Insecurity: No Food Insecurity (06/02/2024)   Hunger Vital Sign    Worried About Running Out of Food in the Last Year: Never true    Ran Out of Food in the Last Year: Never true  Transportation Needs: No Transportation  Needs (06/02/2024)   PRAPARE - Administrator, Civil Service (Medical): No    Lack of Transportation (Non-Medical): No  Physical Activity: Sufficiently Active (06/02/2024)   Exercise Vital Sign    Days of Exercise per Week: 6 days    Minutes of Exercise per Session: 30 min  Stress: Stress Concern Present (06/02/2024)   [employer] of Occupational Health - Occupational Stress Questionnaire    Feeling of Stress: To some extent  Social Connections: Moderately Integrated (06/02/2024)   Social Connection and Isolation Panel    Frequency of Communication with Friends and Family: More than three times a week    Frequency of Social Gatherings with Friends and Family: More than three times a week    Attends Religious Services: More than 4 times per year    Active Member of [employer] or Organizations: No    Attends Engineer, structural: Not on file    Marital Status: Married     Review of Systems  Constitutional:  Negative for appetite change, fever and unexpected weight change.  HENT:  Negative for congestion, sinus pressure and sore throat.   Respiratory:  Negative for cough, chest tightness and shortness of breath.   Cardiovascular:  Negative for chest pain, palpitations and leg swelling.  Gastrointestinal:  Negative for abdominal pain, diarrhea, nausea and vomiting.  Genitourinary:  Negative for difficulty urinating and dysuria.  Musculoskeletal:  Negative for joint swelling and myalgias.  Skin:  Negative for color change and rash.  Neurological:  Negative for dizziness and headaches.  Psychiatric/Behavioral:  Negative for agitation and dysphoric mood.        Objective:     BP 124/74   Pulse 80   Temp 98.2 F (36.8 C)   Resp 16   Ht 5' 7 (1.702 m)   Wt 207 lb 6.4 oz (94.1 kg)   SpO2 98%   BMI 32.48 kg/m  Wt Readings from Last 3 Encounters:  06/03/24 207 lb 6.4 oz (94.1 kg)  03/30/24 197 lb (89.4 kg)  02/29/24 201 lb 12.8 oz (91.5 kg)    Physical  Exam Vitals reviewed.  Constitutional:      General: She is not in acute distress.    Appearance: Normal appearance.  HENT:     Head: Normocephalic and atraumatic.     Right Ear: Ear canal and external ear normal.     Left Ear: Tympanic membrane, ear canal and external ear normal.     Mouth/Throat:     Pharynx: No oropharyngeal exudate or posterior oropharyngeal erythema.  Eyes:     General: No scleral icterus.       Right eye: No discharge.        Left eye: No discharge.     Conjunctiva/sclera: Conjunctivae normal.   Neck:     Thyroid : No thyromegaly.     Comments: Thyroid  fullness.  No lymphadenopaty.  Cardiovascular:     Rate and Rhythm: Normal rate and regular rhythm.  Pulmonary:     Effort: No respiratory distress.     Breath sounds: Normal breath sounds. No wheezing.  Abdominal:     General: Bowel sounds are normal.     Palpations: Abdomen is soft.     Tenderness: There is no abdominal tenderness.   Musculoskeletal:        General: No swelling or tenderness.     Cervical back: Neck supple. No tenderness.  Lymphadenopathy:     Cervical: No cervical adenopathy.   Skin:    Findings: No erythema or rash.   Neurological:     Mental Status: She is alert.   Psychiatric:        Mood and Affect: Mood normal.        Behavior: Behavior normal.     {Perform Simple Foot Exam  Perform Detailed exam:1} {Insert foot Exam (Optional):30965}   Outpatient Encounter Medications as of 06/03/2024  Medication Sig   acetaminophen (TYLENOL) 500 MG tablet Take by mouth.   Cholecalciferol 25 MCG (1000 UT) capsule Take 1,000 Units by mouth daily.   Coenzyme Q10 (COQ-10 PO) Take by mouth.   COLLAGEN PO Take by mouth.   levalbuterol  (XOPENEX  HFA) 45 MCG/ACT inhaler Inhale 2 puffs into the lungs every 6 (six) hours as needed for wheezing.   Magnesium 500 MG TABS Take 1,000 mg by mouth daily.   Misc Natural Products (PUMPKIN SEED OIL) CAPS    Multiple Vitamin (MULTIVITAMIN ADULT  PO) Take by mouth.   Omega-3 Fatty Acids (FISH OIL) 1000 MG CAPS Take 1 capsule by mouth daily.   omeprazole (PRILOSEC) 20 MG capsule Take 20 mg by mouth as needed.   psyllium (METAMUCIL) 58.6 % powder Take 1 packet by mouth daily.   No facility-administered encounter medications on file as of 06/03/2024.     Lab Results  Component Value Date   WBC 7.7 08/19/2023   HGB 13.7 08/19/2023   HCT 41.1 08/19/2023   PLT 281.0 08/19/2023   GLUCOSE 97 12/30/2023   CHOL 215 (H) 03/30/2024   TRIG 74 03/30/2024   HDL 63 03/30/2024   LDLCALC 139 (H) 03/30/2024   ALT 13 12/30/2023   AST 14 12/30/2023   NA 138 12/30/2023   K 4.0 12/30/2023   CL 104 12/30/2023   CREATININE 0.80 12/30/2023   BUN 11 12/30/2023   CO2 24 12/30/2023   TSH 1.780 03/30/2024   HGBA1C 5.0 03/30/2024    US  Soft Tissue Head/Neck (NON-THYROID ) Result Date: 09/15/2023 CLINICAL DATA:  Palpable abnormality involving the posterior aspect of the right neck for the past month. EXAM: ULTRASOUND OF HEAD/NECK SOFT TISSUES TECHNIQUE: Ultrasound examination of the head and neck soft tissues was performed in the area of clinical concern. COMPARISON:  None Available. FINDINGS: Sonographic evaluation of the patient's palpable area of concern involving the posterior aspect of the right neck correlates with a well-defined hypoechoic nodule favored to represent a subcutaneous lymph node. The suspected lymph node is not enlarged by size criteria measuring 0.2 cm in greatest short axis diameter (image 3) Sonographic evaluation of the patient's palpable  area of concern beneath the right ear correlates with a mildly prominent though benign-appearing right cervical lymph node. The lymph node is not enlarged by size criteria measuring 0.4 cm in greatest short axis diameter and maintains a portion of its benign fatty hilum (image 9). IMPRESSION: 1. Sonographic evaluation of the patient's palpable area of concern involving the posterior aspect of the  right neck correlates with a well-defined hypoechoic nodule favored to represent a subcutaneous lymph node. 2. Sonographic evaluation of the patient's palpable area of concern beneath the right ear correlates with a benign-appearing non pathologically enlarged right cervical lymph node presumably reactive in etiology. Electronically Signed   By: Robbi Childs M.D.   On: 09/15/2023 08:44       Assessment & Plan:  There are no diagnoses linked to this encounter.   Dellar Fenton, MD

## 2024-06-04 ENCOUNTER — Other Ambulatory Visit: Payer: Self-pay | Admitting: Internal Medicine

## 2024-06-04 ENCOUNTER — Encounter: Payer: Self-pay | Admitting: Internal Medicine

## 2024-06-04 ENCOUNTER — Ambulatory Visit: Payer: Self-pay | Admitting: Internal Medicine

## 2024-06-04 DIAGNOSIS — R030 Elevated blood-pressure reading, without diagnosis of hypertension: Secondary | ICD-10-CM | POA: Insufficient documentation

## 2024-06-04 DIAGNOSIS — E875 Hyperkalemia: Secondary | ICD-10-CM

## 2024-06-04 LAB — CBC WITH DIFFERENTIAL/PLATELET
Basophils Absolute: 0.1 10*3/uL (ref 0.0–0.2)
Basos: 1 %
EOS (ABSOLUTE): 0.3 10*3/uL (ref 0.0–0.4)
Eos: 4 %
Hematocrit: 44.1 % (ref 34.0–46.6)
Hemoglobin: 15 g/dL (ref 11.1–15.9)
Immature Grans (Abs): 0 10*3/uL (ref 0.0–0.1)
Immature Granulocytes: 0 %
Lymphocytes Absolute: 2.7 10*3/uL (ref 0.7–3.1)
Lymphs: 34 %
MCH: 33 pg (ref 26.6–33.0)
MCHC: 34 g/dL (ref 31.5–35.7)
MCV: 97 fL (ref 79–97)
Monocytes Absolute: 0.5 10*3/uL (ref 0.1–0.9)
Monocytes: 6 %
Neutrophils Absolute: 4.4 10*3/uL (ref 1.4–7.0)
Neutrophils: 55 %
Platelets: 281 10*3/uL (ref 150–450)
RBC: 4.55 x10E6/uL (ref 3.77–5.28)
RDW: 12.2 % (ref 11.7–15.4)
WBC: 8 10*3/uL (ref 3.4–10.8)

## 2024-06-04 LAB — BASIC METABOLIC PANEL WITH GFR
BUN/Creatinine Ratio: 14 (ref 9–23)
BUN: 11 mg/dL (ref 6–20)
CO2: 19 mmol/L — ABNORMAL LOW (ref 20–29)
Calcium: 9.1 mg/dL (ref 8.7–10.2)
Chloride: 102 mmol/L (ref 96–106)
Creatinine, Ser: 0.76 mg/dL (ref 0.57–1.00)
Glucose: 88 mg/dL (ref 70–99)
Potassium: 5.4 mmol/L — ABNORMAL HIGH (ref 3.5–5.2)
Sodium: 138 mmol/L (ref 134–144)
eGFR: 105 mL/min/{1.73_m2} (ref >59)

## 2024-06-04 NOTE — Assessment & Plan Note (Signed)
 Followed by endocrinology. Following thyroid  ultrasound as outlined.

## 2024-06-04 NOTE — Progress Notes (Signed)
Order placed for f/u lab.   

## 2024-06-04 NOTE — Assessment & Plan Note (Signed)
 Blood pressure previously elevated. Checks recently wnl. No changes in medication. Follow pressures.  Follow metabolic panel.

## 2024-06-04 NOTE — Assessment & Plan Note (Addendum)
 Neck fullness and sensation change as outlined. Feels like a lump in her throat. Aggravated by certain position changes and eating as outlined. Recently had ENT evaluation with negative laryngoscopy. Recent neck ultrasound - reviewed. Appears to be reactive lymph node.  No acid reflux. No regurgitation of food. No dysphagia. 2023 - thyroid  ultrasound - stable. Endocrinology following. Given persistent fullness and lump sensation, will obtain CT scan to further evaluate.

## 2024-06-05 ENCOUNTER — Encounter: Payer: Self-pay | Admitting: Internal Medicine

## 2024-06-05 DIAGNOSIS — R221 Localized swelling, mass and lump, neck: Secondary | ICD-10-CM

## 2024-06-06 NOTE — Telephone Encounter (Signed)
 Pt is agreeable to proceed with CT Scan. She accidentally sent a message saying she wanted to wait but then sent another message stating that she is agreeable to proceed.

## 2024-06-15 ENCOUNTER — Other Ambulatory Visit (INDEPENDENT_AMBULATORY_CARE_PROVIDER_SITE_OTHER)

## 2024-06-15 ENCOUNTER — Ambulatory Visit (INDEPENDENT_AMBULATORY_CARE_PROVIDER_SITE_OTHER): Admitting: Family Medicine

## 2024-06-15 DIAGNOSIS — E875 Hyperkalemia: Secondary | ICD-10-CM

## 2024-06-15 LAB — BASIC METABOLIC PANEL WITH GFR
BUN: 17 mg/dL (ref 6–23)
CO2: 24 meq/L (ref 19–32)
Calcium: 8.9 mg/dL (ref 8.4–10.5)
Chloride: 102 meq/L (ref 96–112)
Creatinine, Ser: 0.78 mg/dL (ref 0.40–1.20)
GFR: 99.12 mL/min (ref >60.00)
Glucose, Bld: 114 mg/dL — ABNORMAL HIGH (ref 70–99)
Potassium: 4.1 meq/L (ref 3.5–5.1)
Sodium: 135 meq/L (ref 135–145)

## 2024-06-16 ENCOUNTER — Ambulatory Visit: Payer: Self-pay | Admitting: Internal Medicine

## 2024-06-22 DIAGNOSIS — R09A2 Foreign body sensation, throat: Secondary | ICD-10-CM | POA: Diagnosis not present

## 2024-06-22 DIAGNOSIS — E041 Nontoxic single thyroid nodule: Secondary | ICD-10-CM | POA: Diagnosis not present

## 2024-07-04 DIAGNOSIS — E041 Nontoxic single thyroid nodule: Secondary | ICD-10-CM | POA: Diagnosis not present

## 2024-07-05 NOTE — Telephone Encounter (Signed)
Order placed for CT neck.  

## 2024-07-18 ENCOUNTER — Ambulatory Visit
Admission: RE | Admit: 2024-07-18 | Discharge: 2024-07-18 | Disposition: A | Discharge status: Home / Self Care | Source: Ambulatory Visit | Attending: Internal Medicine | Admitting: Internal Medicine

## 2024-07-18 DIAGNOSIS — R221 Localized swelling, mass and lump, neck: Secondary | ICD-10-CM | POA: Diagnosis not present

## 2024-07-18 MED ORDER — SODIUM CHLORIDE 0.9 % IV SOLN: INTRAVENOUS | Status: DC

## 2024-07-18 MED ORDER — IOHEXOL 300 MG/ML  SOLN
75.0000 mL | Freq: Once | INTRAMUSCULAR | Status: AC | PRN
  Administered 2024-07-18: 75 mL via INTRAVENOUS

## 2024-07-20 ENCOUNTER — Ambulatory Visit: Payer: Self-pay | Admitting: Internal Medicine

## 2024-07-21 NOTE — Telephone Encounter (Signed)
 Copied from CRM [id number]. Topic: Clinical - Lab/Test Results >> Jul 21, 2024 12:57 PM Carlyon D wrote: Reason for CRM: Pt called back in regards to CT. PT notified of CT results. 07/21/24

## 2024-08-23 DIAGNOSIS — I429 Cardiomyopathy, unspecified: Secondary | ICD-10-CM | POA: Diagnosis not present

## 2024-08-23 DIAGNOSIS — I2699 Other pulmonary embolism without acute cor pulmonale: Secondary | ICD-10-CM | POA: Diagnosis not present

## 2024-08-23 DIAGNOSIS — N812 Incomplete uterovaginal prolapse: Secondary | ICD-10-CM | POA: Diagnosis not present

## 2024-08-23 DIAGNOSIS — N393 Stress incontinence (female) (male): Secondary | ICD-10-CM | POA: Diagnosis not present

## 2024-09-06 ENCOUNTER — Ambulatory Visit: Admitting: Internal Medicine

## 2024-09-06 VITALS — BP 120/70 | HR 74 | Resp 16 | Ht 67.0 in | Wt 214.6 lb

## 2024-09-06 DIAGNOSIS — Z23 Encounter for immunization: Secondary | ICD-10-CM

## 2024-09-06 DIAGNOSIS — Z0001 Encounter for general adult medical examination with abnormal findings: Secondary | ICD-10-CM

## 2024-09-06 DIAGNOSIS — E041 Nontoxic single thyroid nodule: Secondary | ICD-10-CM

## 2024-09-06 DIAGNOSIS — E7800 Pure hypercholesterolemia, unspecified: Secondary | ICD-10-CM | POA: Diagnosis not present

## 2024-09-06 DIAGNOSIS — Z0000 Encounter for general adult medical examination without abnormal findings: Secondary | ICD-10-CM

## 2024-09-06 DIAGNOSIS — O903 Peripartum cardiomyopathy: Secondary | ICD-10-CM

## 2024-09-06 DIAGNOSIS — R002 Palpitations: Secondary | ICD-10-CM

## 2024-09-06 DIAGNOSIS — D649 Anemia, unspecified: Secondary | ICD-10-CM

## 2024-09-06 DIAGNOSIS — Z01818 Encounter for other preprocedural examination: Secondary | ICD-10-CM

## 2024-09-06 NOTE — Progress Notes (Unsigned)
 Subjective:    Patient ID: Michele [name], female    DOB: 1991-12-19, 33 y.o.   MRN: [id number]  Patient here for  Chief Complaint  Patient presents with   Annual Exam    HPI Here for a physical exam. Has been seeing gyn - SUI, uterovaginal prolapse. Planning for robotic supracervical hysterectomy/BSO. He reports she has been doing relatively well. Work is going well. She does report she has noticed some intermittent fluttering/palpitations. She taps her chest and these weill resolve - may last approximately 3 minutes. No chest pain or sob reported. Tries to stay active. May occur 1-2x over a couple of weeks. She has been drinking an increased amount of caffeine. No nausea or vomiting. No bowel change.    Past Medical History:  Diagnosis Date   Arrhythmia    Arthritis    Asthma    Chest pain    CHF (congestive heart failure) (HCC)    GERD (gastroesophageal reflux disease)    History of chicken pox    Hx of blood clots    Hx of migraines    Hx: UTI (urinary tract infection)    Hypercholesteremia    Hyperlipidemia    Low back pain    Osteoarthritis    Palpitations    Postpartum cardiomyopathy    Sleep apnea    Thyroid  nodule    Past Surgical History:  Procedure Laterality Date   ABLATION     TONSILLECTOMY AND ADENOIDECTOMY  12/23/1995   Family History  Problem Relation Age of Onset   Hypertension Mother    Diabetes Mother    High Cholesterol Mother    Hyperlipidemia Father    Hypertension Father    Breast cancer Maternal Grandmother 45   Other Maternal Grandmother        plasmacytoma of eye dx 45   Prostate cancer Maternal Grandfather    Heart disease Maternal Grandfather    Hypertension Maternal Grandfather    Diabetes Maternal Grandfather    Lymphoma Maternal Grandfather    Breast cancer Paternal Grandmother    Hyperlipidemia Paternal Grandmother    Hypertension Paternal Grandmother    Diabetes Paternal Grandmother    Mental illness Paternal  Grandmother    Hyperlipidemia Paternal Grandfather    Stroke Paternal Grandfather    Hypertension Paternal Grandfather    Prostate cancer Paternal Grandfather    Breast cancer Other        mat great aunt + pat great aunt   Social History   Socioeconomic History   Marital status: Married    Spouse name: Rockey   Number of children: 0   Years of education: Not on file   Highest education level: Bachelor's degree (e.g., BA, AB, BS)  Occupational History   Occupation: PTA  Tobacco Use   Smoking status: Never   Smokeless tobacco: Never  Vaping Use   Vaping status: Never Used  Substance and Sexual Activity   Alcohol use: Yes    Alcohol/week: 0.0 standard drinks of alcohol   Drug use: No   Sexual activity: Yes  Other Topics Concern   Not on file  Social History Narrative   PT assistant at assisted living; lives in [place]; 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    Social Drivers of Health   Financial Resource Strain: Low Risk  (06/02/2024)   Overall Financial Resource Strain (CARDIA)    Difficulty of Paying Living Expenses: Not very hard  Food Insecurity: No Food Insecurity (06/02/2024)   Hunger  Vital Sign    Worried About Programme researcher, broadcasting/film/video in the Last Year: Never true    Ran Out of Food in the Last Year: Never true  Transportation Needs: No Transportation Needs (06/02/2024)   PRAPARE - Administrator, Civil Service (Medical): No    Lack of Transportation (Non-Medical): No  Physical Activity: Sufficiently Active (06/02/2024)   Exercise Vital Sign    Days of Exercise per Week: 6 days    Minutes of Exercise per Session: 30 min  Stress: Stress Concern Present (06/02/2024)   [employer] of Occupational Health - Occupational Stress Questionnaire    Feeling of Stress: To some extent  Social Connections: Moderately Integrated (06/02/2024)   Social Connection and Isolation Panel    Frequency of Communication with Friends and Family: More than three times a  week    Frequency of Social Gatherings with Friends and Family: More than three times a week    Attends Religious Services: More than 4 times per year    Active Member of [employer] or Organizations: No    Attends Engineer, structural: Not on file    Marital Status: Married     Review of Systems  Constitutional:  Negative for appetite change and unexpected weight change.  HENT:  Negative for congestion, sinus pressure and sore throat.   Eyes:  Negative for pain and visual disturbance.  Respiratory:  Negative for cough, chest tightness and shortness of breath.   Cardiovascular:  Positive for palpitations. Negative for chest pain and leg swelling.  Gastrointestinal:  Negative for abdominal pain, diarrhea, nausea and vomiting.  Genitourinary:  Negative for difficulty urinating and dysuria.  Musculoskeletal:  Negative for joint swelling and myalgias.  Skin:  Negative for color change and rash.  Neurological:  Negative for dizziness and headaches.  Hematological:  Negative for adenopathy. Does not bruise/bleed easily.  Psychiatric/Behavioral:  Negative for agitation and dysphoric mood.        Objective:     BP 120/70   Pulse 74   Resp 16   Ht 5' 7 (1.702 m)   Wt 214 lb 9.6 oz (97.3 kg)   SpO2 99%   BMI 33.61 kg/m  Wt Readings from Last 3 Encounters:  09/06/24 214 lb 9.6 oz (97.3 kg)  06/03/24 207 lb 6.4 oz (94.1 kg)  03/30/24 197 lb (89.4 kg)    Physical Exam Vitals reviewed.  Constitutional:      General: She is not in acute distress.    Appearance: Normal appearance. She is well-developed.  HENT:     Head: Normocephalic and atraumatic.     Right Ear: External ear normal.     Left Ear: External ear normal.     Mouth/Throat:     Pharynx: No oropharyngeal exudate or posterior oropharyngeal erythema.  Eyes:     General: No scleral icterus.       Right eye: No discharge.        Left eye: No discharge.     Conjunctiva/sclera: Conjunctivae normal.  Neck:      Thyroid : No thyromegaly.  Cardiovascular:     Rate and Rhythm: Normal rate and regular rhythm.  Pulmonary:     Effort: No tachypnea, accessory muscle usage or respiratory distress.     Breath sounds: Normal breath sounds. No decreased breath sounds or wheezing.  Chest:  Breasts:    Right: No inverted nipple, mass, nipple discharge or tenderness (no axillary adenopathy).     Left: No inverted  nipple, mass, nipple discharge or tenderness (no axilarry adenopathy).  Abdominal:     General: Bowel sounds are normal.     Palpations: Abdomen is soft.     Tenderness: There is no abdominal tenderness.  Musculoskeletal:        General: No swelling or tenderness.     Cervical back: Neck supple.  Lymphadenopathy:     Cervical: No cervical adenopathy.  Skin:    Findings: No erythema or rash.  Neurological:     Mental Status: She is alert and oriented to person, place, and time.  Psychiatric:        Mood and Affect: Mood normal.        Behavior: Behavior normal.         Outpatient Encounter Medications as of 09/06/2024  Medication Sig   acetaminophen (TYLENOL) 500 MG tablet Take by mouth.   Cholecalciferol 25 MCG (1000 UT) capsule Take 1,000 Units by mouth daily.   Coenzyme Q10 (COQ-10 PO) Take by mouth.   COLLAGEN PO Take by mouth.   levalbuterol  (XOPENEX  HFA) 45 MCG/ACT inhaler Inhale 2 puffs into the lungs every 6 (six) hours as needed for wheezing.   Magnesium 500 MG TABS Take 1,000 mg by mouth daily.   Misc Natural Products (PUMPKIN SEED OIL) CAPS    Multiple Vitamin (MULTIVITAMIN ADULT PO) Take by mouth.   Omega-3 Fatty Acids (FISH OIL) 1000 MG CAPS Take 1 capsule by mouth daily.   omeprazole (PRILOSEC) 20 MG capsule Take 20 mg by mouth as needed.   psyllium (METAMUCIL) 58.6 % powder Take 1 packet by mouth daily.   No facility-administered encounter medications on file as of 09/06/2024.     Lab Results  Component Value Date   WBC 8.2 09/06/2024   HGB 14.3 09/06/2024   HCT  42.0 09/06/2024   PLT 261.0 09/06/2024   GLUCOSE 100 (H) 09/06/2024   CHOL 195 09/06/2024   TRIG 156.0 (H) 09/06/2024   HDL 51.40 09/06/2024   LDLCALC 112 (H) 09/06/2024   ALT 12 09/06/2024   AST 16 09/06/2024   NA 139 09/06/2024   K 4.5 09/06/2024   CL 104 09/06/2024   CREATININE 0.78 09/06/2024   BUN 16 09/06/2024   CO2 27 09/06/2024   TSH 1.780 03/30/2024   HGBA1C 5.0 03/30/2024    CT Soft Tissue Neck W Contrast Result Date: 07/20/2024 CLINICAL DATA:  Neck fullness. Feeling of a lump in the throat, intermittent over the last 4 months. EXAM: CT NECK WITH CONTRAST TECHNIQUE: Multidetector CT imaging of the neck was performed using the standard protocol following the bolus administration of intravenous contrast. RADIATION DOSE REDUCTION: This exam was performed according to the departmental dose-optimization program which includes automated exposure control, adjustment of the mA and/or kV according to patient size and/or use of iterative reconstruction technique. CONTRAST:  75mL OMNIPAQUE  IOHEXOL  300 MG/ML  SOLN COMPARISON:  Ultrasound 09/11/2023 FINDINGS: Pharynx and larynx: No mucosal or submucosal mass lesion or apparent inflammatory change. Salivary glands: Parotid and submandibular glands are normal. Thyroid : Mildly heterogeneous nonenlarged thyroid  gland. The largest measurable nodule measures 10 mm. Thyroid  ultrasound recommended. (Ref: J Am Coll Radiol. 2015 Feb;12(2): 143-50). This was evaluated in 2021 and 1 year follow-up was recommended. Lymph nodes: No lymphadenopathy or soft tissue mass on either side of the neck. Vascular: Normal Limited intracranial: Normal Visualized orbits: Normal Mastoids and visualized paranasal sinuses: Clear Skeleton: Normal Upper chest: Small benign scar in the posterior right upper lobe. No worrisome finding. Other:  None IMPRESSION: 1. No abnormality seen to explain the clinical presentation. No evidence of mass or lymphadenopathy. 2. Mildly heterogeneous  nonenlarged thyroid  gland. The largest measurable nodule measures 10 mm on the right. Thyroid  ultrasound recommended. This was evaluated in 2021 and 1 year follow-up was recommended at that time. Electronically Signed   By: Oneil Officer M.D.   On: 07/20/2024 14:32       Assessment & Plan:  Routine general medical examination at a health care facility  Hypercholesterolemia Assessment & Plan: Low-cholesterol diet and exercise.  Follow lipid panel.  Orders: -     Lipid panel -     Hepatic function panel -     Basic metabolic panel with GFR -     CBC with Differential/Platelet  Immunization due -     Flu vaccine trivalent PF, 6mos and older(Flulaval,Afluria,Fluarix,Fluzone)  Palpitations Assessment & Plan: S/p ablation. Has noticed some increased palpitations as outlined. EGD - SR with no acute ischemic changes. Discussed f/u with cardiology. Discussed the need to decrease caffeine intake. Check labs, including cbc, metabolic panel and magnesium.   Orders: -     EKG 12-Lead -     Magnesium  Need for influenza vaccination  Anemia, unspecified type Assessment & Plan: Regena a documented history of anemia. Check cbc prior to surgery.    Health care maintenance Assessment & Plan: Physical today 09/06/24.  Colonoscopy 04/23/21.  PAP 04/30/20 - pap negative with negative HPV.  PAP 08/31/23 - negative with negative HPV.    Postpartum cardiomyopathy Assessment & Plan: Had follow-up with cardiology 02/25/2024.  Stable.  Recommended trial off scheduled Toprol XL.  She has the metoprolol to take if needed.  Overall has done well. Is having the issues with palpitations as outlined. Recommended f/u with cardiology. Check labs as outlined. Decrease caffeine intake.    Thyroid  nodule Assessment & Plan: Followed by endocrinology. Following thyroid  ultrasound.    Pre-op evaluation Assessment & Plan: Planning for robotic supracervical hysterectomy/BSO as outlined. Reported intermittent  palpitations. EKG - SR with no acute ischemic changes. Discussed decreasing caffeine intake. Check labs, including cbc, metabolic panel and magnesium. Sees cardiology. Discussed recommendation for cardiology f/u prior to surgery.        Allena Hamilton, MD

## 2024-09-07 ENCOUNTER — Ambulatory Visit: Payer: Self-pay | Admitting: Internal Medicine

## 2024-09-07 ENCOUNTER — Encounter: Payer: Self-pay | Admitting: Internal Medicine

## 2024-09-07 DIAGNOSIS — Z01818 Encounter for other preprocedural examination: Secondary | ICD-10-CM | POA: Insufficient documentation

## 2024-09-07 LAB — CBC WITH DIFFERENTIAL/PLATELET
Basophils Absolute: 0.1 K/uL (ref 0.0–0.1)
Basophils Relative: 1.1 % (ref 0.0–3.0)
Eosinophils Absolute: 0.1 K/uL (ref 0.0–0.7)
Eosinophils Relative: 1.6 % (ref 0.0–5.0)
HCT: 42 % (ref 36.0–46.0)
Hemoglobin: 14.3 g/dL (ref 12.0–15.0)
Lymphocytes Relative: 33.6 % (ref 12.0–46.0)
Lymphs Abs: 2.7 K/uL (ref 0.7–4.0)
MCHC: 34.1 g/dL (ref 30.0–36.0)
MCV: 95.4 fl (ref 78.0–100.0)
Monocytes Absolute: 0.5 K/uL (ref 0.1–1.0)
Monocytes Relative: 5.8 % (ref 3.0–12.0)
Neutro Abs: 4.7 K/uL (ref 1.4–7.7)
Neutrophils Relative %: 57.9 % (ref 43.0–77.0)
Platelets: 261 K/uL (ref 150.0–400.0)
RBC: 4.4 Mil/uL (ref 3.87–5.11)
RDW: 12.3 % (ref 11.5–15.5)
WBC: 8.2 K/uL (ref 4.0–10.5)

## 2024-09-07 LAB — BASIC METABOLIC PANEL WITH GFR
BUN: 16 mg/dL (ref 6–23)
CO2: 27 meq/L (ref 19–32)
Calcium: 9.2 mg/dL (ref 8.4–10.5)
Chloride: 104 meq/L (ref 96–112)
Creatinine, Ser: 0.78 mg/dL (ref 0.40–1.20)
GFR: 98.96 mL/min (ref >60.00)
Glucose, Bld: 100 mg/dL — ABNORMAL HIGH (ref 70–99)
Potassium: 4.5 meq/L (ref 3.5–5.1)
Sodium: 139 meq/L (ref 135–145)

## 2024-09-07 LAB — LIPID PANEL
Cholesterol: 195 mg/dL (ref 0–200)
HDL: 51.4 mg/dL (ref >39.00)
LDL Cholesterol: 112 mg/dL — ABNORMAL HIGH (ref 0–99)
NonHDL: 143.51
Total CHOL/HDL Ratio: 4
Triglycerides: 156 mg/dL — ABNORMAL HIGH (ref 0.0–149.0)
VLDL: 31.2 mg/dL (ref 0.0–40.0)

## 2024-09-07 LAB — HEPATIC FUNCTION PANEL
ALT: 12 U/L (ref 0–35)
AST: 16 U/L (ref 0–37)
Albumin: 4.3 g/dL (ref 3.5–5.2)
Alkaline Phosphatase: 45 U/L (ref 39–117)
Bilirubin, Direct: 0.1 mg/dL (ref 0.0–0.3)
Total Bilirubin: 0.4 mg/dL (ref 0.2–1.2)
Total Protein: 7 g/dL (ref 6.0–8.3)

## 2024-09-07 LAB — MAGNESIUM: Magnesium: 1.9 mg/dL (ref 1.5–2.5)

## 2024-09-07 NOTE — Assessment & Plan Note (Signed)
 Regena a documented history of anemia. Check cbc prior to surgery.

## 2024-09-07 NOTE — Assessment & Plan Note (Signed)
 S/p ablation. Has noticed some increased palpitations as outlined. EGD - SR with no acute ischemic changes. Discussed f/u with cardiology. Discussed the need to decrease caffeine intake. Check labs, including cbc, metabolic panel and magnesium.

## 2024-09-07 NOTE — Assessment & Plan Note (Signed)
 Followed by endocrinology. Following thyroid  ultrasound.

## 2024-09-07 NOTE — Assessment & Plan Note (Signed)
 Had follow-up with cardiology 02/25/2024.  Stable.  Recommended trial off scheduled Toprol XL.  She has the metoprolol to take if needed.  Overall has done well. Is having the issues with palpitations as outlined. Recommended f/u with cardiology. Check labs as outlined. Decrease caffeine intake.

## 2024-09-07 NOTE — Assessment & Plan Note (Signed)
 Low cholesterol diet and exercise.  Follow lipid panel.

## 2024-09-07 NOTE — Assessment & Plan Note (Signed)
 Physical today 09/06/24.  Colonoscopy 04/23/21.  PAP 04/30/20 - pap negative with negative HPV.  PAP 08/31/23 - negative with negative HPV.

## 2024-09-07 NOTE — Assessment & Plan Note (Signed)
 Planning for robotic supracervical hysterectomy/BSO as outlined. Reported intermittent palpitations. EKG - SR with no acute ischemic changes. Discussed decreasing caffeine intake. Check labs, including cbc, metabolic panel and magnesium. Sees cardiology. Discussed recommendation for cardiology f/u prior to surgery.

## 2024-09-20 DIAGNOSIS — K219 Gastro-esophageal reflux disease without esophagitis: Secondary | ICD-10-CM | POA: Diagnosis not present

## 2024-09-20 DIAGNOSIS — N813 Complete uterovaginal prolapse: Secondary | ICD-10-CM | POA: Diagnosis not present

## 2024-09-20 DIAGNOSIS — Z6833 Body mass index (BMI) 33.0-33.9, adult: Secondary | ICD-10-CM | POA: Diagnosis not present

## 2024-09-20 DIAGNOSIS — Z79899 Other long term (current) drug therapy: Secondary | ICD-10-CM | POA: Diagnosis not present

## 2024-09-20 DIAGNOSIS — I5030 Unspecified diastolic (congestive) heart failure: Secondary | ICD-10-CM | POA: Diagnosis not present

## 2024-09-20 DIAGNOSIS — N393 Stress incontinence (female) (male): Secondary | ICD-10-CM | POA: Diagnosis not present

## 2024-09-20 DIAGNOSIS — E66812 Obesity, class 2: Secondary | ICD-10-CM | POA: Diagnosis not present

## 2024-09-20 DIAGNOSIS — I2699 Other pulmonary embolism without acute cor pulmonale: Secondary | ICD-10-CM | POA: Diagnosis not present

## 2024-09-20 DIAGNOSIS — N8500 Endometrial hyperplasia, unspecified: Secondary | ICD-10-CM | POA: Diagnosis not present

## 2024-09-20 DIAGNOSIS — Z86718 Personal history of other venous thrombosis and embolism: Secondary | ICD-10-CM | POA: Diagnosis not present

## 2024-09-21 ENCOUNTER — Encounter: Payer: Self-pay | Admitting: Internal Medicine

## 2024-09-22 DIAGNOSIS — R0789 Other chest pain: Secondary | ICD-10-CM | POA: Diagnosis not present

## 2024-09-22 DIAGNOSIS — R1013 Epigastric pain: Secondary | ICD-10-CM | POA: Diagnosis not present

## 2024-09-22 DIAGNOSIS — R071 Chest pain on breathing: Secondary | ICD-10-CM | POA: Diagnosis not present

## 2024-09-22 DIAGNOSIS — Z86718 Personal history of other venous thrombosis and embolism: Secondary | ICD-10-CM | POA: Diagnosis not present

## 2024-09-22 DIAGNOSIS — I498 Other specified cardiac arrhythmias: Secondary | ICD-10-CM | POA: Diagnosis not present

## 2024-09-22 DIAGNOSIS — Z7901 Long term (current) use of anticoagulants: Secondary | ICD-10-CM | POA: Diagnosis not present

## 2024-09-22 DIAGNOSIS — Y838 Other surgical procedures as the cause of abnormal reaction of the patient, or of later complication, without mention of misadventure at the time of the procedure: Secondary | ICD-10-CM | POA: Diagnosis not present

## 2024-09-22 DIAGNOSIS — R079 Chest pain, unspecified: Secondary | ICD-10-CM | POA: Diagnosis not present

## 2024-09-23 DIAGNOSIS — Z86718 Personal history of other venous thrombosis and embolism: Secondary | ICD-10-CM | POA: Diagnosis not present

## 2024-09-23 DIAGNOSIS — Z7901 Long term (current) use of anticoagulants: Secondary | ICD-10-CM | POA: Diagnosis not present

## 2024-09-23 DIAGNOSIS — R1013 Epigastric pain: Secondary | ICD-10-CM | POA: Diagnosis not present

## 2024-10-25 DIAGNOSIS — Z7901 Long term (current) use of anticoagulants: Secondary | ICD-10-CM | POA: Diagnosis not present

## 2024-10-25 DIAGNOSIS — I82412 Acute embolism and thrombosis of left femoral vein: Secondary | ICD-10-CM | POA: Diagnosis not present

## 2024-10-25 DIAGNOSIS — I82442 Acute embolism and thrombosis of left tibial vein: Secondary | ICD-10-CM | POA: Diagnosis not present

## 2024-10-25 DIAGNOSIS — I82449 Acute embolism and thrombosis of unspecified tibial vein: Secondary | ICD-10-CM | POA: Diagnosis not present

## 2024-10-25 DIAGNOSIS — I82432 Acute embolism and thrombosis of left popliteal vein: Secondary | ICD-10-CM | POA: Diagnosis not present

## 2024-10-25 DIAGNOSIS — Z90710 Acquired absence of both cervix and uterus: Secondary | ICD-10-CM | POA: Diagnosis not present

## 2024-10-25 DIAGNOSIS — M7989 Other specified soft tissue disorders: Secondary | ICD-10-CM | POA: Diagnosis not present

## 2024-10-25 DIAGNOSIS — R0781 Pleurodynia: Secondary | ICD-10-CM | POA: Diagnosis not present

## 2024-10-25 DIAGNOSIS — M79605 Pain in left leg: Secondary | ICD-10-CM | POA: Diagnosis not present

## 2024-10-25 DIAGNOSIS — I82409 Acute embolism and thrombosis of unspecified deep veins of unspecified lower extremity: Secondary | ICD-10-CM | POA: Diagnosis not present

## 2024-10-26 ENCOUNTER — Telehealth: Payer: Self-pay

## 2024-10-26 DIAGNOSIS — I82442 Acute embolism and thrombosis of left tibial vein: Secondary | ICD-10-CM | POA: Diagnosis not present

## 2024-10-26 DIAGNOSIS — R0781 Pleurodynia: Secondary | ICD-10-CM | POA: Diagnosis not present

## 2024-10-26 DIAGNOSIS — I82409 Acute embolism and thrombosis of unspecified deep veins of unspecified lower extremity: Secondary | ICD-10-CM | POA: Diagnosis not present

## 2024-10-26 DIAGNOSIS — I82402 Acute embolism and thrombosis of unspecified deep veins of left lower extremity: Secondary | ICD-10-CM | POA: Diagnosis not present

## 2024-10-26 DIAGNOSIS — I82412 Acute embolism and thrombosis of left femoral vein: Secondary | ICD-10-CM | POA: Diagnosis not present

## 2024-10-26 NOTE — Telephone Encounter (Signed)
 Noted. Reviewed ER note. Please call and confirm doing ok.

## 2024-10-26 NOTE — Telephone Encounter (Signed)
 Patient scheduled an appointment with Dr. Allena Hamilton on 11/10/2024 via MyChart with the following comment:  Patient comments: Nee DVT diagnosed at [facility] ER. They have provided me with a prescription or elequis for 1 month but said I need to follow up with you since I will be needing a longer dose.  Please let us  know if Dr. Hamilton would like to see her sooner.

## 2024-10-26 NOTE — Telephone Encounter (Signed)
 Spoke to pt she states that she is feeling better  and has appt scheduled with you on 11/10/24, will reach out if she needs to be seen before then

## 2024-10-28 ENCOUNTER — Encounter: Payer: Self-pay | Admitting: Internal Medicine

## 2024-10-28 DIAGNOSIS — D2261 Melanocytic nevi of right upper limb, including shoulder: Secondary | ICD-10-CM | POA: Diagnosis not present

## 2024-10-28 DIAGNOSIS — D2272 Melanocytic nevi of left lower limb, including hip: Secondary | ICD-10-CM | POA: Diagnosis not present

## 2024-10-28 DIAGNOSIS — I82409 Acute embolism and thrombosis of unspecified deep veins of unspecified lower extremity: Secondary | ICD-10-CM

## 2024-10-28 DIAGNOSIS — D2262 Melanocytic nevi of left upper limb, including shoulder: Secondary | ICD-10-CM | POA: Diagnosis not present

## 2024-10-28 DIAGNOSIS — D225 Melanocytic nevi of trunk: Secondary | ICD-10-CM | POA: Diagnosis not present

## 2024-10-29 NOTE — Telephone Encounter (Signed)
 Order placed for hematology referral. Pt notified via my chart.

## 2024-11-01 ENCOUNTER — Encounter: Payer: Self-pay | Admitting: Internal Medicine

## 2024-11-01 DIAGNOSIS — I82409 Acute embolism and thrombosis of unspecified deep veins of unspecified lower extremity: Secondary | ICD-10-CM | POA: Insufficient documentation

## 2024-11-03 ENCOUNTER — Encounter: Payer: Self-pay | Admitting: Internal Medicine

## 2024-11-03 ENCOUNTER — Inpatient Hospital Stay

## 2024-11-03 ENCOUNTER — Inpatient Hospital Stay: Attending: Internal Medicine | Admitting: Internal Medicine

## 2024-11-03 VITALS — BP 113/76 | HR 82 | Temp 97.8°F | Resp 16 | Ht 67.0 in | Wt 214.7 lb

## 2024-11-03 DIAGNOSIS — Z8042 Family history of malignant neoplasm of prostate: Secondary | ICD-10-CM | POA: Diagnosis not present

## 2024-11-03 DIAGNOSIS — R252 Cramp and spasm: Secondary | ICD-10-CM | POA: Insufficient documentation

## 2024-11-03 DIAGNOSIS — Z83438 Family history of other disorder of lipoprotein metabolism and other lipidemia: Secondary | ICD-10-CM | POA: Diagnosis not present

## 2024-11-03 DIAGNOSIS — Z7901 Long term (current) use of anticoagulants: Secondary | ICD-10-CM | POA: Insufficient documentation

## 2024-11-03 DIAGNOSIS — I82442 Acute embolism and thrombosis of left tibial vein: Secondary | ICD-10-CM | POA: Insufficient documentation

## 2024-11-03 DIAGNOSIS — Z8349 Family history of other endocrine, nutritional and metabolic diseases: Secondary | ICD-10-CM | POA: Diagnosis not present

## 2024-11-03 DIAGNOSIS — D6859 Other primary thrombophilia: Secondary | ICD-10-CM | POA: Insufficient documentation

## 2024-11-03 DIAGNOSIS — I82432 Acute embolism and thrombosis of left popliteal vein: Secondary | ICD-10-CM | POA: Diagnosis not present

## 2024-11-03 DIAGNOSIS — Z8249 Family history of ischemic heart disease and other diseases of the circulatory system: Secondary | ICD-10-CM | POA: Insufficient documentation

## 2024-11-03 DIAGNOSIS — Z833 Family history of diabetes mellitus: Secondary | ICD-10-CM | POA: Diagnosis not present

## 2024-11-03 DIAGNOSIS — Z90710 Acquired absence of both cervix and uterus: Secondary | ICD-10-CM | POA: Diagnosis not present

## 2024-11-03 DIAGNOSIS — E7800 Pure hypercholesterolemia, unspecified: Secondary | ICD-10-CM | POA: Diagnosis not present

## 2024-11-03 DIAGNOSIS — Z79899 Other long term (current) drug therapy: Secondary | ICD-10-CM | POA: Diagnosis not present

## 2024-11-03 DIAGNOSIS — Z823 Family history of stroke: Secondary | ICD-10-CM | POA: Insufficient documentation

## 2024-11-03 DIAGNOSIS — Z807 Family history of other malignant neoplasms of lymphoid, hematopoietic and related tissues: Secondary | ICD-10-CM | POA: Diagnosis not present

## 2024-11-03 DIAGNOSIS — Z803 Family history of malignant neoplasm of breast: Secondary | ICD-10-CM | POA: Insufficient documentation

## 2024-11-03 DIAGNOSIS — Z87440 Personal history of urinary (tract) infections: Secondary | ICD-10-CM | POA: Insufficient documentation

## 2024-11-03 DIAGNOSIS — J45909 Unspecified asthma, uncomplicated: Secondary | ICD-10-CM | POA: Insufficient documentation

## 2024-11-03 MED ORDER — APIXABAN 5 MG PO TABS: 5.0000 mg | ORAL_TABLET | Freq: Two times a day (BID) | ORAL | 1 refills | Status: AC

## 2024-11-03 NOTE — Progress Notes (Signed)
 No questions/concerns at this time.

## 2024-11-03 NOTE — Assessment & Plan Note (Addendum)
  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 Long-term anticoagulation considered if unprovoked clots occur. - Continue Eliquis 5 mg twice daily for three months. - Provided prescription for 70 pills-for 3 months until February 1 week 2026.  Will plan to get workup after finishing up anticoagulation. - Advised wearing compression stockings. - Scheduled follow-up after Eliquis course for blood work to evaluate for hereditary thrombophilia.  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 Discussed genetic factors and importance for treatment and family risk assessment. - Order blood work for hereditary thrombophilia after Eliquis course.     Thank you Dr. Glendia MD  for allowing me to participate in the care of your pleasant patient. Please do not hesitate to contact me with questions or concerns in the interim.  # DISPOSITION: # no labs today # follow up in 3 week f FEB 2026- 1 weeks prior- orders are in- time it for the second week of FEB- 2026.

## 2024-11-03 NOTE — Progress Notes (Signed)
 [facility] CONSULT NOTE  Patient Care Team: Glendia Shad, MD as PCP - General (Internal Medicine) Rennie Cindy SAUNDERS, MD as Consulting Physician (Oncology)  CHIEF COMPLAINTS/PURPOSE OF CONSULTATION: DVT/PE  #  Oncology History   No history exists.     HISTORY OF PRESENTING ILLNESS:  Michele [name] 33 y.o.  female with no prior history of thrombo-embolism has been referred to us  regarding recent DVT.  Discussed the use of AI scribe software for clinical note transcription with the patient, who gave verbal consent to proceed.  History of Present Illness   The patient, with a history of deep vein thrombosis and pulmonary embolism, presents with a recent episode of deep vein thrombosis following a hysterectomy.  She experienced a deep vein thrombosis (DVT) in her left leg following a hysterectomy performed approximately a week ago. During the surgery, she received heparin, and post-operatively, she was on Lovenox  40 mg once daily for a month. Three days after stopping Lovenox , she developed cramping in her leg, which led to a hospital visit where the DVT was diagnosed. She is currently on Eliquis, starting with 10 mg twice daily, now reduced to 5 mg twice daily, and reports improvement in leg cramping since starting Eliquis.  Her first DVT occurred four years ago after childbirth, which resulted in a pulmonary embolism (PE). She experienced cramping in her left leg post-epidural, which was initially dismissed. This DVT also led to a PE. At that time, she was treated with Lovenox  twice daily for three months.  No history of smoking and no known family history of blood clots, although she mentions a possible history in her grandmother. She has not been on birth control pills and has no history of miscarriages. 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 She has been inactive for the past seven weeks due to surgery but typically exercises  daily and tries to maintain a healthy diet.  No current leg cramping. Reports previous swelling in both legs post-pregnancy, with persistent swelling in the affected leg.       Review of Systems  Constitutional:  Negative for chills, diaphoresis, fever, malaise/fatigue and weight loss.  HENT:  Negative for nosebleeds and sore throat.   Eyes:  Negative for double vision.  Respiratory:  Negative for cough, hemoptysis, sputum production, shortness of breath and wheezing.   Cardiovascular:  Negative for chest pain, palpitations, orthopnea and leg swelling.  Gastrointestinal:  Negative for abdominal pain, blood in stool, constipation, diarrhea, heartburn, melena, nausea and vomiting.  Genitourinary:  Negative for dysuria, frequency and urgency.  Musculoskeletal:  Negative for back pain and joint pain.  Skin: Negative.  Negative for itching and rash.  Neurological:  Negative for dizziness, tingling, focal weakness, weakness and headaches.  Endo/Heme/Allergies:  Does not bruise/bleed easily.  Psychiatric/Behavioral:  Negative for depression. The patient is not nervous/anxious and does not have insomnia.      MEDICAL HISTORY:  Past Medical History:  Diagnosis Date   Arrhythmia    Arthritis    Asthma    Chest pain    CHF (congestive heart failure) (HCC)    GERD (gastroesophageal reflux disease)    History of chicken pox    Hx of blood clots    Hx of migraines    Hx: UTI (urinary tract infection)    Hypercholesteremia    Hyperlipidemia    Low back pain    Osteoarthritis    Palpitations    Postpartum cardiomyopathy  Sleep apnea    Thyroid  nodule     SURGICAL HISTORY: Past Surgical History:  Procedure Laterality Date   ABLATION     TONSILLECTOMY AND ADENOIDECTOMY  12/23/1995    SOCIAL HISTORY: Social History   Socioeconomic History   Marital status: Married    Spouse name: Rockey   Number of children: 0   Years of education: Not on file   Highest education level:  Bachelor's degree (e.g., BA, AB, BS)  Occupational History   Occupation: PTA  Tobacco Use   Smoking status: Never   Smokeless tobacco: Never  Vaping Use   Vaping status: Never Used  Substance and Sexual Activity   Alcohol use: Yes    Alcohol/week: 0.0 standard drinks of alcohol   Drug use: No   Sexual activity: Yes  Other Topics Concern   Not on file  Social History Narrative   PT assistant at assisted living; lives in [place]; 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    Social Drivers of Corporate Investment Banker Strain: Low Risk  (06/02/2024)   Overall Financial Resource Strain (CARDIA)    Difficulty of Paying Living Expenses: Not very hard  Food Insecurity: No Food Insecurity (11/03/2024)   Hunger Vital Sign    Worried About Running Out of Food in the Last Year: Never true    Ran Out of Food in the Last Year: Never true  Transportation Needs: No Transportation Needs (11/03/2024)   PRAPARE - Administrator, Civil Service (Medical): No    Lack of Transportation (Non-Medical): No  Physical Activity: Sufficiently Active (06/02/2024)   Exercise Vital Sign    Days of Exercise per Week: 6 days    Minutes of Exercise per Session: 30 min  Stress: Stress Concern Present (06/02/2024)   [employer] of Occupational Health - Occupational Stress Questionnaire    Feeling of Stress: To some extent  Social Connections: Moderately Integrated (06/02/2024)   Social Connection and Isolation Panel    Frequency of Communication with Friends and Family: More than three times a week    Frequency of Social Gatherings with Friends and Family: More than three times a week    Attends Religious Services: More than 4 times per year    Active Member of [employer] or Organizations: No    Attends Banker Meetings: Not on file    Marital Status: Married  Catering Manager Violence: Not At Risk (11/03/2024)   Humiliation, Afraid, Rape, and Kick questionnaire    Fear of  Current or Ex-Partner: No    Emotionally Abused: No    Physically Abused: No    Sexually Abused: No    FAMILY HISTORY: Family History  Problem Relation Age of Onset   Hypertension Mother    Diabetes Mother    High Cholesterol Mother    Hyperlipidemia Father    Hypertension Father    Breast cancer Maternal Grandmother 24   Other Maternal Grandmother        plasmacytoma of eye dx 45   Prostate cancer Maternal Grandfather    Heart disease Maternal Grandfather    Hypertension Maternal Grandfather    Diabetes Maternal Grandfather    Lymphoma Maternal Grandfather    Breast cancer Paternal Grandmother    Hyperlipidemia Paternal Grandmother    Hypertension Paternal Grandmother    Diabetes Paternal Grandmother    Mental illness Paternal Grandmother    Hyperlipidemia Paternal Grandfather    Stroke Paternal Grandfather    Hypertension Paternal Actor  Prostate cancer Paternal Grandfather    Breast cancer Other        mat great aunt + pat great aunt    ALLERGIES:  is allergic to other.  MEDICATIONS:  Current Outpatient Medications  Medication Sig Dispense Refill   acetaminophen (TYLENOL) 500 MG tablet Take by mouth.     Ascorbic Acid (VITAMIN C PO) Take 1 tablet by mouth daily.     Cholecalciferol 25 MCG (1000 UT) capsule Take 1,000 Units by mouth daily. (Patient taking differently: Take 2,000 Units by mouth daily.)     Coenzyme Q10 (COQ-10 PO) Take by mouth daily.     COLLAGEN PO Take by mouth daily.     levalbuterol  (XOPENEX  HFA) 45 MCG/ACT inhaler Inhale 2 puffs into the lungs every 6 (six) hours as needed for wheezing. 1 each 2   Magnesium 500 MG TABS Take 250 mg by mouth daily.     METOPROLOL SUCCINATE PO Take by mouth.     Multiple Vitamin (MULTIVITAMIN ADULT PO) Take by mouth daily.     omeprazole (PRILOSEC) 20 MG capsule Take 20 mg by mouth as needed.  1   psyllium (METAMUCIL) 58.6 % powder Take 1 packet by mouth daily.     apixaban (ELIQUIS) 5 MG TABS tablet  Take 1 tablet (5 mg total) by mouth 2 (two) times daily. 70 tablet 1   No current facility-administered medications for this visit.       PHYSICAL EXAMINATION:  Vitals:   11/03/24 1112  BP: 113/76  Pulse: 82  Resp: 16  Temp: 97.8 F (36.6 C)  SpO2: 100%   Filed Weights   11/03/24 1112  Weight: 214 lb 11.2 oz (97.4 kg)    Physical Exam Vitals and nursing note reviewed.  HENT:     Head: Normocephalic and atraumatic.     Mouth/Throat:     Pharynx: Oropharynx is clear.  Eyes:     Extraocular Movements: Extraocular movements intact.     Pupils: Pupils are equal, round, and reactive to light.  Cardiovascular:     Rate and Rhythm: Normal rate and regular rhythm.  Pulmonary:     Comments: Decreased breath sounds bilaterally.  Abdominal:     Palpations: Abdomen is soft.  Musculoskeletal:        General: Normal range of motion.     Cervical back: Normal range of motion.  Skin:    General: Skin is warm.  Neurological:     General: No focal deficit present.     Mental Status: She is alert and oriented to person, place, and time.  Psychiatric:        Behavior: Behavior normal.        Judgment: Judgment normal.      LABORATORY DATA:  I have reviewed the data as listed Lab Results  Component Value Date   WBC 8.2 09/06/2024   HGB 14.3 09/06/2024   HCT 42.0 09/06/2024   MCV 95.4 09/06/2024   PLT 261.0 09/06/2024   Recent Labs    12/30/23 1407 06/03/24 0921 06/15/24 1319 09/06/24 1553  NA 138 138 135 139  K 4.0 5.4* 4.1 4.5  CL 104 102 102 104  CO2 24 19* 24 27  GLUCOSE 97 88 114* 100*  BUN 11 11 17 16   CREATININE 0.80 0.76 0.78 0.78  CALCIUM 9.1 9.1 8.9 9.2  PROT 7.9  --   --  7.0  ALBUMIN 4.7  --   --  4.3  AST 14  --   --  16  ALT 13  --   --  12  ALKPHOS 57  --   --  45  BILITOT 0.5  --   --  0.4  BILIDIR  --   --   --  0.1    RADIOGRAPHIC STUDIES: I have personally reviewed the radiological images as listed and agreed with the findings in the  report. No results found.  ASSESSMENT & PLAN:   Acute deep vein thrombosis of left popliteal vein (HCC)    Acute deep vein thrombosis of left posterior tibial vein- # NOV 4th, 2025-  PROVOKED [s/p Hystecrormy] prior history of 2021-/post pregnancy-prior history of calf DVT; possible PE-  Discussed genetic factors and need for evaluation. Long-term anticoagulation considered if unprovoked clots occur. - Continue Eliquis 5 mg twice daily for three months. - Provided prescription for 70 pills-for 3 months until February 1 week 2026.  Will plan to get workup after finishing up anticoagulation. - Advised wearing compression stockings. - Scheduled follow-up after Eliquis course for blood work to evaluate for hereditary thrombophilia.  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 Discussed genetic factors and importance for treatment and family risk assessment. - Order blood work for hereditary thrombophilia after Eliquis course.     Thank you Dr. Glendia MD  for allowing me to participate in the care of your pleasant patient. Please do not hesitate to contact me with questions or concerns in the interim.  # DISPOSITION: # no labs today # follow up in 3 week f FEB 2026- 1 weeks prior- orders are in- time it for the second week of FEB- 2026.   All questions were answered. The patient knows to call the clinic with any problems, questions or concerns.    Cindy JONELLE Joe, MD 11/03/2024 9:02 PM

## 2024-11-10 ENCOUNTER — Ambulatory Visit: Admitting: Internal Medicine

## 2024-11-10 ENCOUNTER — Encounter: Payer: Self-pay | Admitting: Internal Medicine

## 2024-11-10 ENCOUNTER — Encounter (INDEPENDENT_AMBULATORY_CARE_PROVIDER_SITE_OTHER): Payer: Self-pay

## 2024-11-10 VITALS — BP 120/80 | HR 85 | Temp 98.8°F | Ht 67.0 in | Wt 214.0 lb

## 2024-11-10 DIAGNOSIS — E7800 Pure hypercholesterolemia, unspecified: Secondary | ICD-10-CM

## 2024-11-10 DIAGNOSIS — I82449 Acute embolism and thrombosis of unspecified tibial vein: Secondary | ICD-10-CM | POA: Diagnosis not present

## 2024-11-10 DIAGNOSIS — O903 Peripartum cardiomyopathy: Secondary | ICD-10-CM

## 2024-11-10 DIAGNOSIS — J45998 Other asthma: Secondary | ICD-10-CM

## 2024-11-10 NOTE — Progress Notes (Signed)
 Subjective:    Patient ID: Michele [name], female    DOB: Mar 07, 1991, 33 y.o.   MRN: [id number]  Patient here for  Chief Complaint  Patient presents with   Med Management - DVT    Eliquis  was recently refilled by Hematology for new diagnosis of DVT. Doing okay, some leg pain since going back to work on Monday    HPI Here for work in appt - work in - ER follow up. S/p recent hysterectomy. Was seen ER 10/25/24 - left leg pain and swelling. Lower extremity ultrasound - DVT - posterior tibial vein. Started on eliquis . Saw hematology 11/03/24. Had f/u with urogyn 10/31/24 - SUI - uterovaginal prolapse (incomplete) - stable. Breathing stable. No abdominal pain or bowel change reported.    Past Medical History:  Diagnosis Date   Arrhythmia    Arthritis    Asthma    Chest pain    CHF (congestive heart failure) (HCC)    GERD (gastroesophageal reflux disease)    History of chicken pox    Hx of blood clots    Hx of migraines    Hx: UTI (urinary tract infection)    Hypercholesteremia    Hyperlipidemia    Low back pain    Osteoarthritis    Palpitations    Postpartum cardiomyopathy    Sleep apnea    Thyroid  nodule    Past Surgical History:  Procedure Laterality Date   ABLATION     TONSILLECTOMY AND ADENOIDECTOMY  12/23/1995   Family History  Problem Relation Age of Onset   Hypertension Mother    Diabetes Mother    High Cholesterol Mother    Hyperlipidemia Father    Hypertension Father    Breast cancer Maternal Grandmother 51   Other Maternal Grandmother        plasmacytoma of eye dx 45   Prostate cancer Maternal Grandfather    Heart disease Maternal Grandfather    Hypertension Maternal Grandfather    Diabetes Maternal Grandfather    Lymphoma Maternal Grandfather    Breast cancer Paternal Grandmother    Hyperlipidemia Paternal Grandmother    Hypertension Paternal Grandmother    Diabetes Paternal Grandmother    Mental illness Paternal Grandmother    Hyperlipidemia  Paternal Grandfather    Stroke Paternal Grandfather    Hypertension Paternal Grandfather    Prostate cancer Paternal Grandfather    Breast cancer Other        mat great aunt + pat great aunt   Social History   Socioeconomic History   Marital status: Married    Spouse name: Rockey   Number of children: 0   Years of education: Not on file   Highest education level: Bachelor's degree (e.g., BA, AB, BS)  Occupational History   Occupation: PTA  Tobacco Use   Smoking status: Never   Smokeless tobacco: Never  Vaping Use   Vaping status: Never Used  Substance and Sexual Activity   Alcohol use: Yes    Alcohol/week: 0.0 standard drinks of alcohol   Drug use: No   Sexual activity: Yes  Other Topics Concern   Not on file  Social History Narrative   PT assistant at assisted living; lives in [place]; 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    Social Drivers of Health   Financial Resource Strain: Low Risk  (11/06/2024)   Overall Financial Resource Strain (CARDIA)    Difficulty of Paying Living Expenses: Not very hard  Food Insecurity: No Food Insecurity (11/06/2024)  Hunger Vital Sign    Worried About Running Out of Food in the Last Year: Never true    Ran Out of Food in the Last Year: Never true  Transportation Needs: No Transportation Needs (11/06/2024)   PRAPARE - Administrator, Civil Service (Medical): No    Lack of Transportation (Non-Medical): No  Physical Activity: Sufficiently Active (11/06/2024)   Exercise Vital Sign    Days of Exercise per Week: 5 days    Minutes of Exercise per Session: 30 min  Stress: Stress Concern Present (11/06/2024)   [employer] of Occupational Health - Occupational Stress Questionnaire    Feeling of Stress: To some extent  Social Connections: Moderately Integrated (11/06/2024)   Social Connection and Isolation Panel    Frequency of Communication with Friends and Family: More than three times a week    Frequency of  Social Gatherings with Friends and Family: More than three times a week    Attends Religious Services: More than 4 times per year    Active Member of [employer] or Organizations: No    Attends Engineer, Structural: Not on file    Marital Status: Married     Review of Systems  Constitutional:  Negative for appetite change and unexpected weight change.  HENT:  Negative for congestion and sinus pressure.   Respiratory:  Negative for cough, chest tightness and shortness of breath.   Cardiovascular:  Negative for chest pain, palpitations and leg swelling.  Gastrointestinal:  Negative for abdominal pain, diarrhea, nausea and vomiting.  Genitourinary:  Negative for difficulty urinating and dysuria.  Musculoskeletal:  Negative for joint swelling and myalgias.  Skin:  Negative for color change and rash.  Neurological:  Negative for dizziness and headaches.  Psychiatric/Behavioral:  Negative for agitation and dysphoric mood.        Objective:     BP 120/80   Pulse 85   Temp 98.8 F (37.1 C) (Oral)   Ht 5' 7 (1.702 m)   Wt 214 lb (97.1 kg)   SpO2 98%   BMI 33.52 kg/m  Wt Readings from Last 3 Encounters:  11/10/24 214 lb (97.1 kg)  11/03/24 214 lb 11.2 oz (97.4 kg)  09/06/24 214 lb 9.6 oz (97.3 kg)    Physical Exam Vitals reviewed.  Constitutional:      General: She is not in acute distress.    Appearance: Normal appearance.  HENT:     Head: Normocephalic and atraumatic.     Right Ear: External ear normal.     Left Ear: External ear normal.     Mouth/Throat:     Pharynx: No oropharyngeal exudate or posterior oropharyngeal erythema.  Eyes:     General: No scleral icterus.       Right eye: No discharge.        Left eye: No discharge.     Conjunctiva/sclera: Conjunctivae normal.  Neck:     Thyroid : No thyromegaly.  Cardiovascular:     Rate and Rhythm: Normal rate and regular rhythm.  Pulmonary:     Effort: No respiratory distress.     Breath sounds: Normal breath  sounds. No wheezing.  Abdominal:     General: Bowel sounds are normal.     Palpations: Abdomen is soft.     Tenderness: There is no abdominal tenderness.  Musculoskeletal:        General: No swelling or tenderness.     Cervical back: Neck supple. No tenderness.  Lymphadenopathy:  Cervical: No cervical adenopathy.  Skin:    Findings: No erythema or rash.  Neurological:     Mental Status: She is alert.  Psychiatric:        Mood and Affect: Mood normal.        Behavior: Behavior normal.         Outpatient Encounter Medications as of 11/10/2024  Medication Sig   acetaminophen (TYLENOL) 500 MG tablet Take by mouth.   apixaban  (ELIQUIS ) 5 MG TABS tablet Take 1 tablet (5 mg total) by mouth 2 (two) times daily.   Ascorbic Acid (VITAMIN C PO) Take 1 tablet by mouth daily.   Cholecalciferol 25 MCG (1000 UT) capsule Take 1,000 Units by mouth daily. (Patient taking differently: Take 2,000 Units by mouth daily.)   Coenzyme Q10 (COQ-10 PO) Take by mouth daily.   COLLAGEN PO Take by mouth daily.   levalbuterol  (XOPENEX  HFA) 45 MCG/ACT inhaler Inhale 2 puffs into the lungs every 6 (six) hours as needed for wheezing.   Magnesium 500 MG TABS Take 250 mg by mouth daily.   METOPROLOL SUCCINATE PO Take by mouth.   Multiple Vitamin (MULTIVITAMIN ADULT PO) Take by mouth daily.   omeprazole (PRILOSEC) 20 MG capsule Take 20 mg by mouth as needed.   psyllium (METAMUCIL) 58.6 % powder Take 1 packet by mouth daily.   No facility-administered encounter medications on file as of 11/10/2024.     Lab Results  Component Value Date   WBC 8.2 09/06/2024   HGB 14.3 09/06/2024   HCT 42.0 09/06/2024   PLT 261.0 09/06/2024   GLUCOSE 100 (H) 09/06/2024   CHOL 195 09/06/2024   TRIG 156.0 (H) 09/06/2024   HDL 51.40 09/06/2024   LDLCALC 112 (H) 09/06/2024   ALT 12 09/06/2024   AST 16 09/06/2024   NA 139 09/06/2024   K 4.5 09/06/2024   CL 104 09/06/2024   CREATININE 0.78 09/06/2024   BUN 16  09/06/2024   CO2 27 09/06/2024   TSH 1.780 03/30/2024   HGBA1C 5.0 03/30/2024    CT Soft Tissue Neck W Contrast Result Date: 07/20/2024 CLINICAL DATA:  Neck fullness. Feeling of a lump in the throat, intermittent over the last 4 months. EXAM: CT NECK WITH CONTRAST TECHNIQUE: Multidetector CT imaging of the neck was performed using the standard protocol following the bolus administration of intravenous contrast. RADIATION DOSE REDUCTION: This exam was performed according to the departmental dose-optimization program which includes automated exposure control, adjustment of the mA and/or kV according to patient size and/or use of iterative reconstruction technique. CONTRAST:  75mL OMNIPAQUE  IOHEXOL  300 MG/ML  SOLN COMPARISON:  Ultrasound 09/11/2023 FINDINGS: Pharynx and larynx: No mucosal or submucosal mass lesion or apparent inflammatory change. Salivary glands: Parotid and submandibular glands are normal. Thyroid : Mildly heterogeneous nonenlarged thyroid  gland. The largest measurable nodule measures 10 mm. Thyroid  ultrasound recommended. (Ref: J Am Coll Radiol. 2015 Feb;12(2): 143-50). This was evaluated in 2021 and 1 year follow-up was recommended. Lymph nodes: No lymphadenopathy or soft tissue mass on either side of the neck. Vascular: Normal Limited intracranial: Normal Visualized orbits: Normal Mastoids and visualized paranasal sinuses: Clear Skeleton: Normal Upper chest: Small benign scar in the posterior right upper lobe. No worrisome finding. Other: None IMPRESSION: 1. No abnormality seen to explain the clinical presentation. No evidence of mass or lymphadenopathy. 2. Mildly heterogeneous nonenlarged thyroid  gland. The largest measurable nodule measures 10 mm on the right. Thyroid  ultrasound recommended. This was evaluated in 2021 and 1 year follow-up was  recommended at that time. Electronically Signed   By: Oneil Officer M.D.   On: 07/20/2024 14:32       Assessment & Plan:  Postpartum  cardiomyopathy Assessment & Plan: Had follow-up with cardiology 02/25/2024.  Stable.  Had recommended trial off scheduled Toprol XL.  She has the metoprolol to take if needed. Overall relatively stable.    Hypercholesterolemia Assessment & Plan: Low cholesterol diet and exercise. Follow lipid panel.   Orders: -     CBC with Differential/Platelet; Future -     Basic metabolic panel with GFR; Future -     Hepatic function panel; Future -     Lipid panel; Future  Deep vein thrombosis (DVT) of tibial vein, unspecified chronicity, unspecified laterality [facility]) Assessment & Plan: S/p recent hysterectomy. Was seen ER 10/25/24 - left leg pain and swelling. Lower extremity ultrasound - DVT - posterior tibial vein. Started on eliquis . Saw hematology 11/03/24.   Seasonal asthma Assessment & Plan: Has seen Dr Tamea previously. Previously on arnuity. Off now. Discussed - f/u Dr Tamea.       Allena Hamilton, MD

## 2024-11-18 ENCOUNTER — Encounter: Payer: Self-pay | Admitting: Internal Medicine

## 2024-11-18 NOTE — Assessment & Plan Note (Signed)
 Has seen Dr Tamea previously. Previously on arnuity. Off now. Discussed - f/u Dr Tamea.

## 2024-11-18 NOTE — Assessment & Plan Note (Signed)
 Had follow-up with cardiology 02/25/2024.  Stable.  Had recommended trial off scheduled Toprol XL.  She has the metoprolol to take if needed. Overall relatively stable.

## 2024-11-18 NOTE — Assessment & Plan Note (Signed)
 Low cholesterol diet and exercise.  Follow lipid panel.

## 2024-11-18 NOTE — Assessment & Plan Note (Signed)
 S/p recent hysterectomy. Was seen ER 10/25/24 - left leg pain and swelling. Lower extremity ultrasound - DVT - posterior tibial vein. Started on eliquis . Saw hematology 11/03/24.

## 2024-11-18 NOTE — Addendum Note (Signed)
 Addended by: GLENDIA ALLENA RAMAN on: 11/18/2024 10:23 PM   Modules accepted: Level of Service

## 2024-12-26 ENCOUNTER — Encounter: Payer: Self-pay | Admitting: Internal Medicine

## 2025-02-01 ENCOUNTER — Inpatient Hospital Stay

## 2025-02-08 ENCOUNTER — Inpatient Hospital Stay: Admitting: Internal Medicine

## 2025-03-02 ENCOUNTER — Ambulatory Visit: Admitting: Internal Medicine
